# Patient Record
Sex: Male | Born: 1935 | Race: White | Hispanic: No | Marital: Married | Smoking: Former smoker
Health system: Southern US, Community
[De-identification: ages and names within clinical notes are randomized; demographics above are authoritative.]

## PROBLEM LIST (undated history)

## (undated) DIAGNOSIS — K7460 Unspecified cirrhosis of liver: Secondary | ICD-10-CM

## (undated) DIAGNOSIS — E079 Disorder of thyroid, unspecified: Secondary | ICD-10-CM

## (undated) DIAGNOSIS — E559 Vitamin D deficiency, unspecified: Secondary | ICD-10-CM

## (undated) DIAGNOSIS — I5032 Chronic diastolic (congestive) heart failure: Secondary | ICD-10-CM

## (undated) DIAGNOSIS — N390 Urinary tract infection, site not specified: Secondary | ICD-10-CM

## (undated) DIAGNOSIS — H409 Unspecified glaucoma: Secondary | ICD-10-CM

## (undated) DIAGNOSIS — M1990 Unspecified osteoarthritis, unspecified site: Secondary | ICD-10-CM

## (undated) DIAGNOSIS — M109 Gout, unspecified: Secondary | ICD-10-CM

## (undated) DIAGNOSIS — H3530 Unspecified macular degeneration: Secondary | ICD-10-CM

## (undated) HISTORY — PX: TYMPANOPLASTY: SHX33

## (undated) HISTORY — PX: BLADDER SURGERY: SHX569

## (undated) HISTORY — PX: CATARACT EXTRACTION, BILATERAL: SHX1313

## (undated) HISTORY — PX: RETINAL DETACHMENT SURGERY: SHX105

---
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
## 1973-03-26 HISTORY — PX: LYMPH NODE BIOPSY: SHX201

## 2003-04-22 ENCOUNTER — Other Ambulatory Visit: Admission: RE | Admit: 2003-04-22 | Discharge: 2003-04-22 | Payer: Self-pay | Admitting: Unknown Physician Specialty

## 2004-03-21 ENCOUNTER — Ambulatory Visit (HOSPITAL_COMMUNITY): Admission: RE | Admit: 2004-03-21 | Discharge: 2004-03-21 | Payer: Self-pay | Admitting: Ophthalmology

## 2004-05-22 ENCOUNTER — Ambulatory Visit (HOSPITAL_COMMUNITY): Admission: RE | Admit: 2004-05-22 | Discharge: 2004-05-23 | Payer: Self-pay | Admitting: Ophthalmology

## 2004-08-28 ENCOUNTER — Ambulatory Visit (HOSPITAL_COMMUNITY): Admission: RE | Admit: 2004-08-28 | Discharge: 2004-08-28 | Payer: Self-pay | Admitting: Ophthalmology

## 2004-11-16 ENCOUNTER — Other Ambulatory Visit: Admission: RE | Admit: 2004-11-16 | Discharge: 2004-11-16 | Payer: Self-pay | Admitting: Dermatology

## 2004-12-28 ENCOUNTER — Other Ambulatory Visit: Admission: RE | Admit: 2004-12-28 | Discharge: 2004-12-28 | Payer: Self-pay | Admitting: Dermatology

## 2005-03-29 ENCOUNTER — Other Ambulatory Visit: Admission: RE | Admit: 2005-03-29 | Discharge: 2005-03-29 | Payer: Self-pay | Admitting: Dermatology

## 2011-11-28 ENCOUNTER — Other Ambulatory Visit (HOSPITAL_COMMUNITY): Payer: Self-pay | Admitting: *Deleted

## 2011-11-28 ENCOUNTER — Ambulatory Visit (HOSPITAL_COMMUNITY)
Admission: RE | Admit: 2011-11-28 | Discharge: 2011-11-28 | Disposition: A | Discharge status: Home / Self Care | Payer: Medicare Other | Source: Ambulatory Visit | Attending: *Deleted | Admitting: *Deleted

## 2011-11-28 DIAGNOSIS — R1011 Right upper quadrant pain: Secondary | ICD-10-CM | POA: Insufficient documentation

## 2011-11-28 DIAGNOSIS — K8020 Calculus of gallbladder without cholecystitis without obstruction: Secondary | ICD-10-CM | POA: Insufficient documentation

## 2011-11-28 DIAGNOSIS — R932 Abnormal findings on diagnostic imaging of liver and biliary tract: Secondary | ICD-10-CM | POA: Insufficient documentation

## 2011-11-29 ENCOUNTER — Inpatient Hospital Stay (HOSPITAL_COMMUNITY)
Admission: EM | Admit: 2011-11-29 | Discharge: 2011-12-02 | DRG: 419 | Disposition: A | Discharge status: Home / Self Care | Payer: Medicare Other | Attending: General Surgery | Admitting: General Surgery

## 2011-11-29 ENCOUNTER — Inpatient Hospital Stay (HOSPITAL_COMMUNITY): Payer: Medicare Other

## 2011-11-29 ENCOUNTER — Encounter (HOSPITAL_COMMUNITY): Payer: Self-pay | Admitting: *Deleted

## 2011-11-29 ENCOUNTER — Emergency Department (HOSPITAL_COMMUNITY): Payer: Medicare Other

## 2011-11-29 DIAGNOSIS — H409 Unspecified glaucoma: Secondary | ICD-10-CM | POA: Diagnosis present

## 2011-11-29 DIAGNOSIS — K8000 Calculus of gallbladder with acute cholecystitis without obstruction: Principal | ICD-10-CM | POA: Diagnosis present

## 2011-11-29 DIAGNOSIS — Z7982 Long term (current) use of aspirin: Secondary | ICD-10-CM

## 2011-11-29 DIAGNOSIS — K810 Acute cholecystitis: Secondary | ICD-10-CM

## 2011-11-29 HISTORY — DX: Unspecified glaucoma: H40.9

## 2011-11-29 LAB — HEPATIC FUNCTION PANEL
ALT: 34 U/L (ref 0–53)
AST: 32 U/L (ref 0–37)
Albumin: 3.1 g/dL — ABNORMAL LOW (ref 3.5–5.2)
Alkaline Phosphatase: 198 U/L — ABNORMAL HIGH (ref 39–117)
Bilirubin, Direct: 0.6 mg/dL — ABNORMAL HIGH (ref 0.0–0.3)
Indirect Bilirubin: 1.1 mg/dL — ABNORMAL HIGH (ref 0.3–0.9)
Total Bilirubin: 1.7 mg/dL — ABNORMAL HIGH (ref 0.3–1.2)
Total Protein: 7.9 g/dL (ref 6.0–8.3)

## 2011-11-29 LAB — CBC WITH DIFFERENTIAL/PLATELET
Basophils Absolute: 0 10*3/uL (ref 0.0–0.1)
Eosinophils Absolute: 0.3 10*3/uL (ref 0.0–0.7)
Eosinophils Relative: 2 % (ref 0–5)
Hemoglobin: 14.9 g/dL (ref 13.0–17.0)
Lymphocytes Relative: 17 % (ref 12–46)
Lymphs Abs: 2.1 10*3/uL (ref 0.7–4.0)
MCH: 30.5 pg (ref 26.0–34.0)
MCHC: 33.9 g/dL (ref 30.0–36.0)
MCV: 90.2 fL (ref 78.0–100.0)
Monocytes Absolute: 1.2 10*3/uL — ABNORMAL HIGH (ref 0.1–1.0)
Monocytes Relative: 10 % (ref 3–12)
Neutrophils Relative %: 71 % (ref 43–77)
Platelets: 219 10*3/uL (ref 150–400)
RBC: 4.88 MIL/uL (ref 4.22–5.81)
RDW: 13.3 % (ref 11.5–15.5)
WBC: 12.5 10*3/uL — ABNORMAL HIGH (ref 4.0–10.5)

## 2011-11-29 LAB — URINALYSIS, ROUTINE W REFLEX MICROSCOPIC
Glucose, UA: NEGATIVE mg/dL
Hgb urine dipstick: NEGATIVE
Leukocytes, UA: NEGATIVE
Protein, ur: NEGATIVE mg/dL
Specific Gravity, Urine: 1.02 (ref 1.005–1.030)
Urobilinogen, UA: 0.2 mg/dL (ref 0.0–1.0)
pH: 5.5 (ref 5.0–8.0)

## 2011-11-29 LAB — BASIC METABOLIC PANEL
BUN: 21 mg/dL (ref 6–23)
CO2: 25 mEq/L (ref 19–32)
Calcium: 10.1 mg/dL (ref 8.4–10.5)
Creatinine, Ser: 1.11 mg/dL (ref 0.50–1.35)
Glucose, Bld: 122 mg/dL — ABNORMAL HIGH (ref 70–99)
Potassium: 4.1 mEq/L (ref 3.5–5.1)
Sodium: 134 mEq/L — ABNORMAL LOW (ref 135–145)

## 2011-11-29 LAB — SURGICAL PCR SCREEN: Staphylococcus aureus: NEGATIVE

## 2011-11-29 MED ORDER — SODIUM CHLORIDE 0.9 % IV SOLN
INTRAVENOUS | Status: DC
  Administered 2011-11-29: 19:00:00 via INTRAVENOUS

## 2011-11-29 MED ORDER — LATANOPROST 0.005 % OP SOLN
1.0000 [drp] | Freq: Every day | OPHTHALMIC | Status: DC
  Filled 2011-11-29: qty 2.5

## 2011-11-29 MED ORDER — CIPROFLOXACIN IN D5W 400 MG/200ML IV SOLN
400.0000 mg | Freq: Two times a day (BID) | INTRAVENOUS | Status: DC
  Administered 2011-11-29 – 2011-11-30 (×2): 400 mg via INTRAVENOUS
  Filled 2011-11-29 (×8): qty 200

## 2011-11-29 MED ORDER — CHLORHEXIDINE GLUCONATE 4 % EX LIQD
1.0000 "application " | Freq: Once | CUTANEOUS | Status: AC
  Administered 2011-11-29: 1 via TOPICAL
  Filled 2011-11-29: qty 15

## 2011-11-29 MED ORDER — MORPHINE SULFATE 2 MG/ML IJ SOLN
2.0000 mg | INTRAMUSCULAR | Status: DC | PRN
Start: 2011-11-29 — End: 2011-11-30

## 2011-11-29 MED ORDER — ENOXAPARIN SODIUM 40 MG/0.4ML ~~LOC~~ SOLN
40.0000 mg | SUBCUTANEOUS | Status: DC
  Administered 2011-11-29: 40 mg via SUBCUTANEOUS
  Filled 2011-11-29: qty 0.4

## 2011-11-29 MED ORDER — SODIUM CHLORIDE 0.9 % IV SOLN
INTRAVENOUS | Status: DC
  Administered 2011-11-29: 13:00:00 via INTRAVENOUS

## 2011-11-29 MED ORDER — DIPHENHYDRAMINE HCL 50 MG/ML IJ SOLN: 12.5000 mg | Freq: Four times a day (QID) | INTRAMUSCULAR | Status: DC | PRN

## 2011-11-29 MED ORDER — CIPROFLOXACIN IN D5W 400 MG/200ML IV SOLN
INTRAVENOUS | Status: AC
  Filled 2011-11-29: qty 200

## 2011-11-29 MED ORDER — ONDANSETRON HCL 4 MG/2ML IJ SOLN: 4.0000 mg | Freq: Four times a day (QID) | INTRAMUSCULAR | Status: DC | PRN

## 2011-11-29 MED ORDER — DIPHENHYDRAMINE HCL 12.5 MG/5ML PO ELIX: 12.5000 mg | ORAL_SOLUTION | Freq: Four times a day (QID) | ORAL | Status: DC | PRN

## 2011-11-29 MED ORDER — IOHEXOL 300 MG/ML  SOLN
100.0000 mL | Freq: Once | INTRAMUSCULAR | Status: AC | PRN
  Administered 2011-11-29: 100 mL via INTRAVENOUS

## 2011-11-29 MED ORDER — LACTATED RINGERS IV SOLN
INTRAVENOUS | Status: DC
  Administered 2011-11-29: 22:00:00 via INTRAVENOUS
  Administered 2011-11-30: 1000 mL via INTRAVENOUS

## 2011-11-29 NOTE — ED Notes (Signed)
Attempted to call report to Shanda Bumps, [place]. RN to call back.

## 2011-11-29 NOTE — ED Provider Notes (Signed)
History  This chart was scribed for Carleene Cooper III, MD by Bennett Scrape. This patient was seen in room [facility]18/[facility]18 and the patient's care was started at 12:49PM.  CSN: [id number]  Arrival date & time 11/29/11  1050   First MD Initiated Contact with Patient 11/29/11 1249      Chief Complaint  Patient presents with  . Abdominal Pain     The history is provided by the patient. No language interpreter was used.    Gabriel Spencer is a 78 y.o. male who presents to the Emergency Department complaining of 5 days of gradual onset, gradually improving, constant RLQ abdominal pain with associated diaphoresis, weakness, and decreased appetite.  The pain is non-radiating with no modifying factors. He denies taking OTC medications at home to improve symptoms and denies having prior episodes of similar symptoms. He states that he was seen by his PCP yesterday and had blood drawn and an [place] at the Medical center in [place]. He reports was told to come here by PCP today after reviewing his results but is unaware of the results himself. He denies fever, sore throat, visual disturbance, CP, cough, SOB, nausea, emesis, diarrhea, urinary symptoms, back pain, HA,  numbness and rash as associated symptoms.  He has a h/o glaucoma and takes ASA and use Xalatan daily. He denies smoking and alcohol use.   Past Medical History  Diagnosis Date  . Glaucoma     History reviewed. No pertinent past surgical history. Eye and ear surgery per pt at bedside  No family history on file.  History  Substance Use Topics  . Smoking status: Never Smoker   . Smokeless tobacco: Not on file  . Alcohol Use: No      Review of Systems  A complete 10 system review of systems was obtained and all systems are negative except as noted in the HPI and PMH.    Allergies  Keflex  Home Medications   Current Outpatient Rx  Name Route Sig Dispense Refill  . ASPIRIN EC 81 MG PO TBEC Oral Take 81 mg by mouth daily.    .  OCUVITE PO TABS Oral Take 1 tablet by mouth daily.    Marland Kitchen LATANOPROST 0.005 % OP SOLN Both Eyes Place 1 drop into both eyes at bedtime.      Triage Vitals: BP 149/66  Pulse 86  Temp 98.1 F (36.7 C) (Oral)  Resp 20  Ht 5\' 11"  (1.803 m)  Wt 230 lb (104.327 kg)  BMI 32.08 kg/m2  SpO2 96%  Physical Exam  Nursing note and vitals reviewed. Constitutional: He is oriented to person, place, and time. He appears well-developed and well-nourished. No distress.  HENT:  Head: Normocephalic and atraumatic.       Dry mucus membranes  Eyes: Conjunctivae and EOM are normal. Pupils are equal, round, and reactive to light.  Neck: Neck supple. No tracheal deviation present.  Cardiovascular: Normal rate, regular rhythm and normal heart sounds.   Pulmonary/Chest: Effort normal and breath sounds normal. No respiratory distress.  Abdominal: Soft. There is tenderness (RLQ ).  Musculoskeletal: Normal range of motion. He exhibits no edema.  Neurological: He is alert and oriented to person, place, and time.  Skin: Skin is warm and dry.  Psychiatric: He has a normal mood and affect. His behavior is normal.    ED Course  Procedures (including critical care time)  DIAGNOSTIC STUDIES: Oxygen Saturation is 96% on room air, adequate by my interpretation.    COORDINATION  OF CARE: 12:57PM-Discussed treatment plan which includes my review of prior lab and [place] reports with pt at bedside and pt agreed to plan.  1:05PM-Informed pt of [place] result showing a gallstone and a high white blood cell count. Discussed adding a CT scan to the treatment plan with pt and pt agreed to plan. He turned down pain medication.  Labs Reviewed  URINALYSIS, ROUTINE W REFLEX MICROSCOPIC - Abnormal; Notable for the following:    Bilirubin Urine SMALL (*)     Ketones, ur TRACE (*)     All other components within normal limits  CBC WITH DIFFERENTIAL - Abnormal; Notable for the following:    WBC 12.5 (*)     Neutro Abs 8.9 (*)      Monocytes Absolute 1.2 (*)     All other components within normal limits  BASIC METABOLIC PANEL - Abnormal; Notable for the following:    Sodium 134 (*)     Glucose, Bld 122 (*)     GFR calc non Af Amer 62 (*)     GFR calc Af Amer 71 (*)     All other components within normal limits   US Abdomen Limited Ruq  11/28/2011  *RADIOLOGY REPORT*  Clinical Data: Right quadrant pain.  LIMITED ABDOMINAL ULTRASOUND  Comparison:  None.  Findings:  Gallbladder:  1.7 cm mobile gallstone.  Gallbladder wall thickening measuring up to 5.9 mm.  No pericholecystic fluid.  The patient was not tender over this region during scanning per ultrasound technologist.  Common bile duct:  Proximal aspect measures 5 mm.  Mid to distal aspect not visualized secondary to bowel gas.  Liver:  Markedly heterogeneous appearance of the liver which appears elongated.  Slightly lobular contour.  Question underlying cirrhosis?  Within the dome of the liver is a 6 mm hypoechoic structure possibly a cyst although too small to characterize.  IMPRESSION: 1.7 cm mobile gallstone.  Gallbladder wall thickening measuring up to 5.9 mm.  No pericholecystic fluid.  The patient was not tender over this region during scanning per ultrasound technologist. Although cholecystitis is not excluded based on gallbladder wall thickening, this may be explained by other causes such as increased right heart pressure or metabolic abnormality.  Markedly heterogeneous appearance of the liver which appears elongated.  Slightly lobular contour.  Question underlying cirrhosis?  Within the dome of the liver is a 6 mm hypoechoic structure possibly a cyst although too small to characterize.   Original Report Authenticated By: Fuller Canada, M.D.     Date: 11/29/2011  Rate:87  Rhythm: normal sinus rhythm and premature atrial contractions (PAC)  QRS Axis: left  Intervals: normal QRS:  Left ventricular hypertrophy  ST/T Wave abnormalities: normal  Conduction  Disutrbances:none  Narrative Interpretation: Abnormal EKG  Old EKG Reviewed: unchanged  3:53 PM CT of abdomen confirms diagnosis of acute cholecystitis.  Case discussed with Franky Macho, M.D., who accepts pt for admission.  1. Acute cholecystitis     I personally performed the services described in this documentation, which was scribed in my presence. The recorded information has been reviewed and considered.  Osvaldo Human, MD       Carleene Cooper III, MD 11/29/11 [phone]

## 2011-11-29 NOTE — ED Notes (Signed)
Patient reports seeing PCP yesterday for same. Ultrasound obtained here at [facility]. US showed gallstone. Patient reports decreased appetite, generalized weakness.

## 2011-11-29 NOTE — ED Notes (Signed)
RLQ pain x 5 days ago.  Seen PCP yesterday and had blood drawn and [place].  Reports was told to come here by PCP.  Also c/o diaphoresis, weakness, decreased appetite.  Denies n/v/d.

## 2011-11-29 NOTE — ED Notes (Signed)
Patient transported to CT 

## 2011-11-29 NOTE — Progress Notes (Signed)
Dr. Lovell Sheehan paged and made aware of patient's arrival to room 308. No return page at this time.

## 2011-11-29 NOTE — H&P (Signed)
Gabriel Spencer is an 78 y.o. male.   Chief Complaint: Right upper quadrant abdominal pain HPI: Patient is a 78 year old white male who over this weekend record quadrant abdominal pain. Outpatient ultrasound was done which revealed cholelithiasis with a normal common bile duct. He returned the following day to the emergency room as he started having right upper quadrant abdominal pain. His primary care physician in the incidental sent him to the emergency room. He was noted have a mild leukocytosis. CT scan of the abdomen and pelvis revealed cholecystitis with cholelithiasis. The hepatobiliary tree was not dilated. Surgery was consulted and the patient was met in the hospital for further evaluation treatment. He states his abdominal pain has somewhat eased since his admission.  Past Medical History  Diagnosis Date  . Glaucoma     History reviewed. No pertinent past surgical history.  History reviewed. No pertinent family history. Social History:  reports that he has never smoked. He does not have any smokeless tobacco history on file. He reports that he does not drink alcohol or use illicit drugs.  Allergies:  Allergies  Allergen Reactions  . Keflex (Cephalexin) Hives    Medications Prior to Admission  Medication Sig Dispense Refill  . aspirin EC 81 MG tablet Take 81 mg by mouth daily.      . beta carotene w/minerals (OCUVITE) tablet Take 1 tablet by mouth daily.      Marland Kitchen latanoprost (XALATAN) 0.005 % ophthalmic solution Place 1 drop into both eyes at bedtime.        Results for orders placed during the hospital encounter of 11/29/11 (from the past 48 hour(s))  URINALYSIS, ROUTINE W REFLEX MICROSCOPIC     Status: Abnormal   Collection Time   11/29/11 11:27 AM      Component Value Range Comment   Color, Urine YELLOW  YELLOW    APPearance CLEAR  CLEAR    Specific Gravity, Urine 1.020  1.005 - 1.030    pH 5.5  5.0 - 8.0    Glucose, UA NEGATIVE  NEGATIVE mg/dL    Hgb urine dipstick NEGATIVE   NEGATIVE    Bilirubin Urine SMALL (*) NEGATIVE    Ketones, ur TRACE (*) NEGATIVE mg/dL    Protein, ur NEGATIVE  NEGATIVE mg/dL    Urobilinogen, UA 0.2  0.0 - 1.0 mg/dL    Nitrite NEGATIVE  NEGATIVE    Leukocytes, UA NEGATIVE  NEGATIVE MICROSCOPIC NOT DONE ON URINES WITH NEGATIVE PROTEIN, BLOOD, LEUKOCYTES, NITRITE, OR GLUCOSE <1000 mg/dL.  CBC WITH DIFFERENTIAL     Status: Abnormal   Collection Time   11/29/11 11:40 AM      Component Value Range Comment   WBC 12.5 (*) 4.0 - 10.5 K/uL    RBC 4.88  4.22 - 5.81 MIL/uL    Hemoglobin 14.9  13.0 - 17.0 g/dL    HCT 52.8  41.3 - 24.4 %    MCV 90.2  78.0 - 100.0 fL    MCH 30.5  26.0 - 34.0 pg    MCHC 33.9  30.0 - 36.0 g/dL    RDW 01.0  27.2 - 53.6 %    Platelets 219  150 - 400 K/uL    Neutrophils Relative 71  43 - 77 %    Neutro Abs 8.9 (*) 1.7 - 7.7 K/uL    Lymphocytes Relative 17  12 - 46 %    Lymphs Abs 2.1  0.7 - 4.0 K/uL    Monocytes Relative 10  3 -  12 %    Monocytes Absolute 1.2 (*) 0.1 - 1.0 K/uL    Eosinophils Relative 2  0 - 5 %    Eosinophils Absolute 0.3  0.0 - 0.7 K/uL    Basophils Relative 0  0 - 1 %    Basophils Absolute 0.0  0.0 - 0.1 K/uL   BASIC METABOLIC PANEL     Status: Abnormal   Collection Time   11/29/11 11:40 AM      Component Value Range Comment   Sodium 134 (*) 135 - 145 mEq/L    Potassium 4.1  3.5 - 5.1 mEq/L    Chloride 97  96 - 112 mEq/L    CO2 25  19 - 32 mEq/L    Glucose, Bld 122 (*) 70 - 99 mg/dL    BUN 21  6 - 23 mg/dL    Creatinine, Ser 1.61  0.50 - 1.35 mg/dL    Calcium 09.6  8.4 - 10.5 mg/dL    GFR calc non Af Amer 62 (*) >90 mL/min    GFR calc Af Amer 71 (*) >90 mL/min   HEPATIC FUNCTION PANEL     Status: Abnormal   Collection Time   11/29/11 11:40 AM      Component Value Range Comment   Total Protein 7.9  6.0 - 8.3 g/dL    Albumin 3.1 (*) 3.5 - 5.2 g/dL    AST 32  0 - 37 U/L    ALT 34  0 - 53 U/L    Alkaline Phosphatase 198 (*) 39 - 117 U/L    Total Bilirubin 1.7 (*) 0.3 - 1.2 mg/dL     Bilirubin, Direct 0.6 (*) 0.0 - 0.3 mg/dL    Indirect Bilirubin 1.1 (*) 0.3 - 0.9 mg/dL    Ct Abdomen Pelvis W Contrast  11/29/2011  *RADIOLOGY REPORT*  Clinical Data: Right lower quadrant pain.  Leukocytosis.  CT ABDOMEN AND PELVIS WITH CONTRAST  Technique:  Multidetector CT imaging of the abdomen and pelvis was performed following the standard protocol during bolus administration of intravenous contrast.  Contrast: OMNIPAQUE IOHEXOL 300 MG/ML  SOLN  Comparison: None.  Findings: Normal appendix is seen.  The gallbladder is dilated and shows diffuse gallbladder wall thickening and pericholecystic inflammatory changes.  At least one cholesterol gallstone is also seen.  These signs are consistent with acute cholecystitis.  There is no evidence of biliary ductal dilatation.  Mild hepatic steatosis noted, but no liver masses are identified. The pancreas is normal in appearance.  Spleen, adrenal glands, and kidneys are normal in appearance, except for a tiny nonobstructing 2 mm calculus in the upper pole of the left kidney.  No evidence of hydronephrosis.  Incidentally noted is a benign lipoma in the ascending colon measuring 2.5 cm.  No other soft tissue masses or lymphadenopathy identified within the abdomen or pelvis.  Diverticulosis is seen involving the descending and sigmoid portions of the colon, however there is no evidence of diverticulitis.  No abscess or free fluid identified.  IMPRESSION:  1.  No acute cholecystitis. 2.  No evidence of biliary ductal dilatation or appendicitis. 3.  Mild hepatic steatosis. 4.  Incidentally noted nonobstructing left nephrolithiasis, colonic diverticulosis and small right colonic lipoma.   Original Report Authenticated By: Danae Orleans, M.D.    US Abdomen Limited Ruq  11/28/2011  *RADIOLOGY REPORT*  Clinical Data: Right quadrant pain.  LIMITED ABDOMINAL ULTRASOUND  Comparison:  None.  Findings:  Gallbladder:  1.7 cm mobile gallstone.  Gallbladder wall thickening  measuring up to 5.9 mm.  No pericholecystic fluid.  The patient was not tender over this region during scanning per ultrasound technologist.  Common bile duct:  Proximal aspect measures 5 mm.  Mid to distal aspect not visualized secondary to bowel gas.  Liver:  Markedly heterogeneous appearance of the liver which appears elongated.  Slightly lobular contour.  Question underlying cirrhosis?  Within the dome of the liver is a 6 mm hypoechoic structure possibly a cyst although too small to characterize.  IMPRESSION: 1.7 cm mobile gallstone.  Gallbladder wall thickening measuring up to 5.9 mm.  No pericholecystic fluid.  The patient was not tender over this region during scanning per ultrasound technologist. Although cholecystitis is not excluded based on gallbladder wall thickening, this may be explained by other causes such as increased right heart pressure or metabolic abnormality.  Markedly heterogeneous appearance of the liver which appears elongated.  Slightly lobular contour.  Question underlying cirrhosis?  Within the dome of the liver is a 6 mm hypoechoic structure possibly a cyst although too small to characterize.   Original Report Authenticated By: Fuller Canada, M.D.     Review of Systems  Constitutional: Negative.   HENT: Negative.   Respiratory: Negative.   Cardiovascular: Negative.   Gastrointestinal: Positive for nausea and abdominal pain.  Genitourinary: Negative.   Musculoskeletal: Negative.   Skin: Negative.   Neurological: Negative.   Endo/Heme/Allergies: Negative.     Blood pressure 126/73, pulse 85, temperature 97.1 F (36.2 C), temperature source Oral, resp. rate 18, height 5\' 11"  (1.803 m), weight 100.472 kg (221 lb 8 oz), SpO2 94.00%. Physical Exam  Constitutional: He is oriented to person, place, and time. He appears well-developed and well-nourished.  HENT:  Head: Normocephalic and atraumatic.  Eyes: No scleral icterus.  Neck: Normal range of motion. Neck supple.    Cardiovascular: Normal rate, regular rhythm and normal heart sounds.   Respiratory: Effort normal and breath sounds normal.  GI: Soft. Bowel sounds are normal. He exhibits no distension. There is no tenderness. There is no rebound.  Neurological: He is alert and oriented to person, place, and time.  Skin: Skin is warm and dry.     Assessment/Plan Impression: Cholecystitis, cholelithiasis. Plan: Patient admitted to the hospital for IV antibiotics and control of his abdominal pain. He stopped clear will undergo a laparoscopic cholecystectomy with possible cholangiograms tomorrow. Risks and benefits of the procedure including bleeding, infection, hepatobiliary, the possibly of an open procedure were fully explained to the patient, gave informed consent.  Dannon Perlow A 11/29/2011, 8:26 PM

## 2011-11-29 NOTE — ED Notes (Signed)
Report given to Cicily,RN unit 300 Ready to receive patient.

## 2011-11-30 ENCOUNTER — Inpatient Hospital Stay (HOSPITAL_COMMUNITY): Payer: Medicare Other

## 2011-11-30 ENCOUNTER — Inpatient Hospital Stay (HOSPITAL_COMMUNITY): Payer: Medicare Other | Admitting: Anesthesiology

## 2011-11-30 ENCOUNTER — Encounter (HOSPITAL_COMMUNITY): Payer: Self-pay | Admitting: Anesthesiology

## 2011-11-30 ENCOUNTER — Encounter (HOSPITAL_COMMUNITY): Payer: Self-pay | Admitting: *Deleted

## 2011-11-30 ENCOUNTER — Encounter (HOSPITAL_COMMUNITY)
Admission: EM | Disposition: A | Discharge status: Home / Self Care | Payer: Self-pay | Source: Home / Self Care | Attending: General Surgery

## 2011-11-30 HISTORY — PX: CHOLECYSTECTOMY: SHX55

## 2011-11-30 LAB — HEPATIC FUNCTION PANEL
ALT: 27 U/L (ref 0–53)
ALT: 30 U/L (ref 0–53)
AST: 28 U/L (ref 0–37)
Albumin: 2.6 g/dL — ABNORMAL LOW (ref 3.5–5.2)
Alkaline Phosphatase: 187 U/L — ABNORMAL HIGH (ref 39–117)
Alkaline Phosphatase: 189 U/L — ABNORMAL HIGH (ref 39–117)
Bilirubin, Direct: 0.4 mg/dL — ABNORMAL HIGH (ref 0.0–0.3)
Indirect Bilirubin: 0.8 mg/dL (ref 0.3–0.9)
Indirect Bilirubin: 0.7 mg/dL (ref 0.3–0.9)
Total Bilirubin: 1.2 mg/dL (ref 0.3–1.2)
Total Protein: 6.8 g/dL (ref 6.0–8.3)
Total Protein: 6.7 g/dL (ref 6.0–8.3)

## 2011-11-30 LAB — CBC
HCT: 39.8 % (ref 39.0–52.0)
Hemoglobin: 13.2 g/dL (ref 13.0–17.0)
MCH: 29.9 pg (ref 26.0–34.0)
MCHC: 33.2 g/dL (ref 30.0–36.0)
MCV: 90.2 fL (ref 78.0–100.0)
Platelets: 225 10*3/uL (ref 150–400)
RBC: 4.41 MIL/uL (ref 4.22–5.81)
RDW: 13.4 % (ref 11.5–15.5)
WBC: 10.9 10*3/uL — ABNORMAL HIGH (ref 4.0–10.5)

## 2011-11-30 SURGERY — LAPAROSCOPIC CHOLECYSTECTOMY
Anesthesia: General | Site: Abdomen | Wound class: Contaminated

## 2011-11-30 MED ORDER — MIDAZOLAM HCL 2 MG/2ML IJ SOLN
INTRAMUSCULAR | Status: AC
  Filled 2011-11-30: qty 2

## 2011-11-30 MED ORDER — ONDANSETRON HCL 4 MG/2ML IJ SOLN
4.0000 mg | Freq: Once | INTRAMUSCULAR | Status: AC
  Administered 2011-11-30: 4 mg via INTRAVENOUS

## 2011-11-30 MED ORDER — ENOXAPARIN SODIUM 40 MG/0.4ML ~~LOC~~ SOLN
40.0000 mg | SUBCUTANEOUS | Status: DC
  Administered 2011-12-01 – 2011-12-02 (×2): 40 mg via SUBCUTANEOUS
  Filled 2011-11-30 (×2): qty 0.4

## 2011-11-30 MED ORDER — ACETAMINOPHEN 10 MG/ML IV SOLN
1000.0000 mg | Freq: Four times a day (QID) | INTRAVENOUS | Status: AC
  Administered 2011-11-30 – 2011-12-01 (×4): 1000 mg via INTRAVENOUS
  Filled 2011-11-30 (×3): qty 100

## 2011-11-30 MED ORDER — LACTATED RINGERS IV SOLN: INTRAVENOUS | Status: DC

## 2011-11-30 MED ORDER — PROPOFOL 10 MG/ML IV EMUL
INTRAVENOUS | Status: DC | PRN
  Administered 2011-11-30: 120 mg via INTRAVENOUS

## 2011-11-30 MED ORDER — ONDANSETRON HCL 4 MG/2ML IJ SOLN: 4.0000 mg | Freq: Once | INTRAMUSCULAR | Status: DC | PRN

## 2011-11-30 MED ORDER — FENTANYL CITRATE 0.05 MG/ML IJ SOLN
INTRAMUSCULAR | Status: AC
  Filled 2011-11-30: qty 5

## 2011-11-30 MED ORDER — SUCCINYLCHOLINE CHLORIDE 20 MG/ML IJ SOLN
INTRAMUSCULAR | Status: AC
  Filled 2011-11-30: qty 1

## 2011-11-30 MED ORDER — ONDANSETRON HCL 4 MG PO TABS: 4.0000 mg | ORAL_TABLET | Freq: Four times a day (QID) | ORAL | Status: DC | PRN

## 2011-11-30 MED ORDER — SUCCINYLCHOLINE CHLORIDE 20 MG/ML IJ SOLN
INTRAMUSCULAR | Status: DC | PRN
  Administered 2011-11-30: 140 mg via INTRAVENOUS

## 2011-11-30 MED ORDER — FENTANYL CITRATE 0.05 MG/ML IJ SOLN
25.0000 ug | INTRAMUSCULAR | Status: DC | PRN
  Administered 2011-11-30 (×2): 25 ug via INTRAVENOUS

## 2011-11-30 MED ORDER — ROCURONIUM BROMIDE 50 MG/5ML IV SOLN
INTRAVENOUS | Status: AC
  Filled 2011-11-30: qty 1

## 2011-11-30 MED ORDER — ONDANSETRON HCL 4 MG/2ML IJ SOLN
INTRAMUSCULAR | Status: AC
  Filled 2011-11-30: qty 2

## 2011-11-30 MED ORDER — ONDANSETRON HCL 4 MG/2ML IJ SOLN: 4.0000 mg | Freq: Four times a day (QID) | INTRAMUSCULAR | Status: DC | PRN

## 2011-11-30 MED ORDER — GLYCOPYRROLATE 0.2 MG/ML IJ SOLN
0.2000 mg | Freq: Once | INTRAMUSCULAR | Status: AC
  Administered 2011-11-30: 0.2 mg via INTRAVENOUS

## 2011-11-30 MED ORDER — HEMOSTATIC AGENTS (NO CHARGE) OPTIME
TOPICAL | Status: DC | PRN
  Administered 2011-11-30 (×2): 1 via TOPICAL

## 2011-11-30 MED ORDER — BUPIVACAINE HCL (PF) 0.5 % IJ SOLN
INTRAMUSCULAR | Status: DC | PRN
  Administered 2011-11-30: 10 mL

## 2011-11-30 MED ORDER — GLYCOPYRROLATE 0.2 MG/ML IJ SOLN
INTRAMUSCULAR | Status: AC
  Filled 2011-11-30: qty 1

## 2011-11-30 MED ORDER — BUPIVACAINE HCL (PF) 0.5 % IJ SOLN
INTRAMUSCULAR | Status: AC
  Filled 2011-11-30: qty 30

## 2011-11-30 MED ORDER — 0.9 % SODIUM CHLORIDE (POUR BTL) OPTIME
TOPICAL | Status: DC | PRN
  Administered 2011-11-30: 1000 mL

## 2011-11-30 MED ORDER — FENTANYL CITRATE 0.05 MG/ML IJ SOLN
INTRAMUSCULAR | Status: DC | PRN
  Administered 2011-11-30: 25 ug via INTRAVENOUS
  Administered 2011-11-30: 100 ug via INTRAVENOUS
  Administered 2011-11-30: 50 ug via INTRAVENOUS

## 2011-11-30 MED ORDER — ACETAMINOPHEN 10 MG/ML IV SOLN
INTRAVENOUS | Status: AC
  Filled 2011-11-30: qty 100

## 2011-11-30 MED ORDER — CIPROFLOXACIN IN D5W 400 MG/200ML IV SOLN
400.0000 mg | Freq: Two times a day (BID) | INTRAVENOUS | Status: DC
  Administered 2011-12-01 – 2011-12-02 (×3): 400 mg via INTRAVENOUS
  Filled 2011-11-30 (×7): qty 200

## 2011-11-30 MED ORDER — MIDAZOLAM HCL 2 MG/2ML IJ SOLN
1.0000 mg | INTRAMUSCULAR | Status: DC | PRN
  Administered 2011-11-30: 2 mg via INTRAVENOUS

## 2011-11-30 MED ORDER — LACTATED RINGERS IV SOLN
INTRAVENOUS | Status: DC
  Administered 2011-11-30 – 2011-12-01 (×3): via INTRAVENOUS

## 2011-11-30 MED ORDER — FENTANYL CITRATE 0.05 MG/ML IJ SOLN
INTRAMUSCULAR | Status: AC
  Filled 2011-11-30: qty 2

## 2011-11-30 MED ORDER — PROPOFOL 10 MG/ML IV EMUL
INTRAVENOUS | Status: AC
  Filled 2011-11-30: qty 20

## 2011-11-30 MED ORDER — MORPHINE SULFATE 2 MG/ML IJ SOLN
2.0000 mg | INTRAMUSCULAR | Status: DC | PRN
  Administered 2011-11-30 (×4): 2 mg via INTRAVENOUS
  Filled 2011-11-30 (×4): qty 1

## 2011-11-30 MED ORDER — LACTATED RINGERS IV SOLN
INTRAVENOUS | Status: DC | PRN
  Administered 2011-11-30: 10:00:00 via INTRAVENOUS

## 2011-11-30 MED ORDER — EPHEDRINE SULFATE 50 MG/ML IJ SOLN
INTRAMUSCULAR | Status: DC | PRN
  Administered 2011-11-30: 5 mg via INTRAVENOUS

## 2011-11-30 MED ORDER — CIPROFLOXACIN IN D5W 400 MG/200ML IV SOLN
INTRAVENOUS | Status: AC
  Filled 2011-11-30: qty 200

## 2011-11-30 MED ORDER — ALUM & MAG HYDROXIDE-SIMETH 200-200-20 MG/5ML PO SUSP: 30.0000 mL | ORAL | Status: DC | PRN

## 2011-11-30 MED ORDER — SODIUM CHLORIDE 0.9 % IR SOLN
Status: DC | PRN
  Administered 2011-11-30: 3000 mL

## 2011-11-30 SURGICAL SUPPLY — 48 items
APPLIER CLIP ROT 10 11.4 M/L (STAPLE) ×3
APR CLP MED LRG 11.4X10 (STAPLE) ×2
BAG HAMPER (MISCELLANEOUS) ×3 IMPLANT
BAG SPEC RTRVL LRG 6X4 10 (ENDOMECHANICALS) ×2
CATH CHOLANGIOGRAM 4.5FR (CATHETERS) ×1 IMPLANT
CLIP APPLIE ROT 10 11.4 M/L (STAPLE) ×2 IMPLANT
CLOTH BEACON ORANGE TIMEOUT ST (SAFETY) ×3 IMPLANT
COVER LIGHT HANDLE STERIS (MISCELLANEOUS) ×6 IMPLANT
COVER MAYO STAND XLG (DRAPE) ×1 IMPLANT
DECANTER SPIKE VIAL GLASS SM (MISCELLANEOUS) ×3 IMPLANT
DISSECTOR BLUNT TIP ENDO 5MM (MISCELLANEOUS) IMPLANT
DRAPE C-ARM FOLDED MOBILE STRL (DRAPES) ×1 IMPLANT
DURAPREP 26ML APPLICATOR (WOUND CARE) ×3 IMPLANT
ELECT REM PT RETURN 9FT ADLT (ELECTROSURGICAL) ×3
ELECTRODE REM PT RTRN 9FT ADLT (ELECTROSURGICAL) ×2 IMPLANT
EVACUATOR DRAINAGE 10X20 100CC (DRAIN) ×1 IMPLANT
EVACUATOR SILICONE 100CC (DRAIN) ×3
FILTER SMOKE EVAC LAPAROSHD (FILTER) ×3 IMPLANT
FORMALIN 10 PREFIL 120ML (MISCELLANEOUS) ×3 IMPLANT
GLOVE BIO SURGEON STRL SZ7.5 (GLOVE) ×3 IMPLANT
GLOVE BIOGEL PI IND STRL 7.0 (GLOVE) ×2 IMPLANT
GLOVE BIOGEL PI INDICATOR 7.0 (GLOVE) ×2
GLOVE ECLIPSE 6.5 STRL STRAW (GLOVE) ×2 IMPLANT
GLOVE SS BIOGEL STRL SZ 6.5 (GLOVE) ×1 IMPLANT
GLOVE SUPERSENSE BIOGEL SZ 6.5 (GLOVE) ×1
GOWN STRL REIN XL XLG (GOWN DISPOSABLE) ×11 IMPLANT
HEMOSTAT SNOW SURGICEL 2X4 (HEMOSTASIS) ×5 IMPLANT
INST SET LAPROSCOPIC AP (KITS) ×3 IMPLANT
IV NS IRRIG 3000ML ARTHROMATIC (IV SOLUTION) ×2 IMPLANT
KIT ROOM TURNOVER APOR (KITS) ×3 IMPLANT
KIT TROCAR LAP CHOLE (TROCAR) ×3 IMPLANT
MANIFOLD NEPTUNE II (INSTRUMENTS) ×3 IMPLANT
NS IRRIG 1000ML POUR BTL (IV SOLUTION) ×3 IMPLANT
PACK LAP CHOLE LZT030E (CUSTOM PROCEDURE TRAY) ×3 IMPLANT
PAD ARMBOARD 7.5X6 YLW CONV (MISCELLANEOUS) ×3 IMPLANT
POUCH SPECIMEN RETRIEVAL 10MM (ENDOMECHANICALS) ×3 IMPLANT
SET BASIN LINEN APH (SET/KITS/TRAYS/PACK) ×3 IMPLANT
SET TUBE IRRIG SUCTION NO TIP (IRRIGATION / IRRIGATOR) ×2 IMPLANT
SPONGE DRAIN TRACH 4X4 STRL 2S (GAUZE/BANDAGES/DRESSINGS) ×2 IMPLANT
SPONGE GAUZE 2X2 8PLY STRL LF (GAUZE/BANDAGES/DRESSINGS) ×6 IMPLANT
STAPLER VISISTAT (STAPLE) ×3 IMPLANT
SUT ETHILON 3 0 FSL (SUTURE) ×2 IMPLANT
SUT VICRYL 0 UR6 27IN ABS (SUTURE) ×5 IMPLANT
SYR 20CC LL (SYRINGE) ×1 IMPLANT
SYR 30ML LL (SYRINGE) ×1 IMPLANT
TAPE CLOTH SURG 4X10 WHT LF (GAUZE/BANDAGES/DRESSINGS) ×2 IMPLANT
WARMER LAPAROSCOPE (MISCELLANEOUS) ×3 IMPLANT
YANKAUER SUCT 12FT TUBE ARGYLE (SUCTIONS) ×3 IMPLANT

## 2011-11-30 NOTE — Transfer of Care (Signed)
Immediate Anesthesia Transfer of Care Note  Patient: Gabriel Spencer  Procedure(s) Performed: Procedure(s) (LRB) with comments: LAPAROSCOPIC CHOLECYSTECTOMY (N/A)  Patient Location: PACU  Anesthesia Type: General  Level of Consciousness: awake, alert  and oriented  Airway & Oxygen Therapy: Patient Spontanous Breathing and Patient connected to face mask oxygen  Post-op Assessment: Report given to PACU RN  Post vital signs: Reviewed and stable  Complications: No apparent anesthesia complications

## 2011-11-30 NOTE — Anesthesia Preprocedure Evaluation (Signed)
Anesthesia Evaluation  Patient identified by MRN, date of birth, ID band Patient awake    Reviewed: Allergy & Precautions, H&P , NPO status , Patient's Chart, lab work & pertinent test results  Airway Mallampati: II TM Distance: >3 FB Neck ROM: Limited    Dental  (+) Teeth Intact   Pulmonary neg pulmonary ROS,    Pulmonary exam normal       Cardiovascular negative cardio ROS  Rhythm:Regular Rate:Normal     Neuro/Psych negative neurological ROS  negative psych ROS   GI/Hepatic negative GI ROS, Neg liver ROS,   Endo/Other  negative endocrine ROS  Renal/GU negative Renal ROS     Musculoskeletal negative musculoskeletal ROS (+)   Abdominal Normal abdominal exam  (+)   Peds  Hematology negative hematology ROS (+)   Anesthesia Other Findings   Reproductive/Obstetrics                           Anesthesia Physical Anesthesia Plan  ASA: II  Anesthesia Plan: General   Post-op Pain Management:    Induction: Intravenous  Airway Management Planned: Video Laryngoscope Planned and Oral ETT  Additional Equipment:   Intra-op Plan:   Post-operative Plan: Extubation in OR  Informed Consent: I have reviewed the patients History and Physical, chart, labs and discussed the procedure including the risks, benefits and alternatives for the proposed anesthesia with the patient or authorized representative who has indicated his/her understanding and acceptance.   Dental advisory given  Plan Discussed with: CRNA  Anesthesia Plan Comments:         Anesthesia Quick Evaluation

## 2011-11-30 NOTE — Preoperative (Signed)
Beta Blockers   Reason not to administer Beta Blockers:Not Applicable 

## 2011-11-30 NOTE — Progress Notes (Signed)
UR Chart Review Completed  

## 2011-11-30 NOTE — Op Note (Signed)
Patient:  Gabriel Spencer  DOB:  November 13, 1931  MRN:  [id number]   Preop Diagnosis:  Acute cholecystitis, cholelithiasis  Postop Diagnosis:  Same, hydrops of gallbladder with gangrenous changes  Procedure:  Laparoscopic cholecystectomy  Surgeon:  Franky Macho, M.D.  Anes:  General endotracheal  Indications:  Patient is a 78 year old white male who presents with acute cholecystitis secondary to cholelithiasis. The risks and benefits of the procedure including bleeding, infection, hepatobiliary, the possibly of an open procedure were fully explained to the patient, gave informed consent.  Procedure note:  Patient is placed the supine position. After induction of general endotracheal anesthesia, the abdomen was prepped and draped using the usual sterile technique with DuraPrep. Surgical site confirmation was performed.  A supraumbilical incision was made down to the fascia. A Veress needle was introduced into the abdominal cavity and confirmation of placement was done using the saline drop test. The abdomen was then insufflated to 16 mm mercury pressure. An 11 mm trocar was introduced into the abdominal cavity under direct visualization without difficulty. The patient was placed in reverse Trendelenburg position and additional 11 mm trocar was placed the epigastric region and 5 mm trochars were placed the right upper quadrant and right flank regions. The liver was inspected and noted to be mildly cirrhotic in nature. The gallbladder was noted to have gangrenous changes with significant edema and inflammation of the gallbladder wall. The gallbladder was decompressed and hydrops of the gallbladder was found. A single stone was noted to be in the neck of the cystic duct. This was dislodged in order to facilitate grasping of the gallbladder. The gallbladder was retracted superiorly and laterally. The dissection was begun around the infundibulum of the gallbladder. The cystic duct was first identified. Its  junction to the infundibulum flow identified. Endoclips placed proximally distally on the cystic duct and cystic duct was divided. This was likewise done to the cystic artery. The gallbladder was then freed away from the gallbladder fossa using Bovie electrocautery. The gallbladder delivered through the epigastric trocar site using an Endo Catch bag. The gallbladder bed was inspected and any bleeding was controlled using Bovie electrocautery. The right upper quadrant was copiously are irrigated normal saline. A #10 flat Jackson-Pratt drain was placed into the gallbladder bed and brought out through the right lateral trocar site. It was secured at the skin level using 3-0 nylon interrupted suture. Surgicel is placed the gallbladder fossa. All fluid and air were then evacuated from the abdominal cavity prior to removal of the trochars.  All wounds were gave normal saline. All wounds were injected with 0.5% Sensorcaine. The epigastric fascia as well as supraumbilical fascia were reapproximated using 0 Vicryl interrupted sutures. All skin incisions were closed using staples. Betadine ointment and dry sterile dressings were applied.  All tape and needle counts were correct at the end of the procedure. The patient was extubated in the operating room and went back to recovery room awake in stable condition.  Complications:  None  EBL:  Minimal  Specimen:  Gallbladder  Drains: JP drain to gallbladder bed

## 2011-11-30 NOTE — Anesthesia Postprocedure Evaluation (Signed)
  Anesthesia Post-op Note  Patient: Gabriel Spencer  Procedure(s) Performed: Procedure(s) (LRB) with comments: LAPAROSCOPIC CHOLECYSTECTOMY (N/A)  Patient Location: PACU  Anesthesia Type: General  Level of Consciousness: awake, alert  and oriented  Airway and Oxygen Therapy: Patient Spontanous Breathing and Patient connected to face mask oxygen  Post-op Pain: none  Post-op Assessment: Post-op Vital signs reviewed, Patient's Cardiovascular Status Stable, Respiratory Function Stable, Patent Airway, No signs of Nausea or vomiting and Pain level controlled  Post-op Vital Signs: Reviewed and stable  Complications: No apparent anesthesia complications

## 2011-12-01 LAB — BASIC METABOLIC PANEL
BUN: 14 mg/dL (ref 6–23)
Calcium: 9 mg/dL (ref 8.4–10.5)
Creatinine, Ser: 1.04 mg/dL (ref 0.50–1.35)
GFR calc Af Amer: 77 mL/min — ABNORMAL LOW (ref >90)
GFR calc non Af Amer: 67 mL/min — ABNORMAL LOW (ref >90)
Potassium: 4.3 mEq/L (ref 3.5–5.1)

## 2011-12-01 LAB — CBC
HCT: 37.5 % — ABNORMAL LOW (ref 39.0–52.0)
MCH: 30 pg (ref 26.0–34.0)
MCHC: 33.1 g/dL (ref 30.0–36.0)
MCV: 90.6 fL (ref 78.0–100.0)
RDW: 13.5 % (ref 11.5–15.5)

## 2011-12-01 MED ORDER — HYDROCODONE-ACETAMINOPHEN 5-325 MG PO TABS
1.0000 | ORAL_TABLET | ORAL | Status: DC | PRN
  Administered 2011-12-01 – 2011-12-02 (×4): 1 via ORAL
  Filled 2011-12-01 (×4): qty 1

## 2011-12-01 NOTE — Progress Notes (Signed)
Patient noted to 84% on RA, patient denies any shortness of breath at this time, lung sounds clear,applied 2L of O2 via nasal canula, O2 improved to 91%,  Incentive spirometry done at this time, O2 sat increased to 95% afterwards, encourage incentive spirometry used every 2 hours while awake, patient verbalized understanding,called and spoke with Dr. Lovell Sheehan, new order for O2 via [place]

## 2011-12-01 NOTE — Progress Notes (Signed)
1 Day Post-Op  Subjective: Feels much better. Tolerating clear liquid diet well.  Objective: Vital signs in last 24 hours: Temp:  [96.8 F (36 C)-98.2 F (36.8 C)] 96.8 F (36 C) (09/07 0200) Pulse Rate:  [76-94] 76  (09/07 0200) Resp:  [12-20] 20  (09/07 0200) BP: (103-148)/(54-79) 103/65 mmHg (09/07 0200) SpO2:  [90 %-100 %] 97 % (09/07 0200) Last BM Date: 11/30/11  Intake/Output from previous day: 09/06 0701 - 09/07 0700 In: 3121.7 [P.O.:650; I.V.:2471.7] Out: 1230 [Urine:950; Drains:180; Blood:100] Intake/Output this shift: Total I/O In: 825 [P.O.:825] Out: -   General appearance: alert, cooperative and appears stated age Resp: clear to auscultation bilaterally Cardio: regular rate and rhythm, S1, S2 normal, no murmur, click, rub or gallop GI: Soft. Dressings dry and intact. JP drainage with serosanguineous drainage. No bilious drainage noted.  Lab Results:   [facility] 12/01/11 0605 11/30/11 0447  WBC 12.4* 10.9*  HGB 12.4* 13.2  HCT 37.5* 39.8  PLT 227 225   BMET  Basename 12/01/11 0605 11/29/11 1140  NA 133* 134*  K 4.3 4.1  CL 97 97  CO2 27 25  GLUCOSE 132* 122*  BUN 14 21  CREATININE 1.04 1.11  CALCIUM 9.0 10.1   PT/INR No results found for this basename: LABPROT:2,INR:2 in the last 72 hours  Studies/Results: Chest 2 View  11/29/2011  *RADIOLOGY REPORT*  Clinical Data: Preoperative assessment for cholecystectomy  CHEST - 2 VIEW  Comparison: 03/21/2004  Findings: Normal heart size, mediastinal contours and pulmonary vascularity. Bronchitic changes with bibasilar atelectasis. Upper lungs clear. No pleural effusion or pneumothorax. Question thoracic ankylosis.  IMPRESSION: Bronchitic changes with bibasilar atelectasis.   Original Report Authenticated By: Lollie Marrow, M.D.    Ct Abdomen Pelvis W Contrast  11/29/2011  *RADIOLOGY REPORT*  Clinical Data: Right lower quadrant pain.  Leukocytosis.  CT ABDOMEN AND PELVIS WITH CONTRAST  Technique:  Multidetector  CT imaging of the abdomen and pelvis was performed following the standard protocol during bolus administration of intravenous contrast.  Contrast: OMNIPAQUE IOHEXOL 300 MG/ML  SOLN  Comparison: None.  Findings: Normal appendix is seen.  The gallbladder is dilated and shows diffuse gallbladder wall thickening and pericholecystic inflammatory changes.  At least one cholesterol gallstone is also seen.  These signs are consistent with acute cholecystitis.  There is no evidence of biliary ductal dilatation.  Mild hepatic steatosis noted, but no liver masses are identified. The pancreas is normal in appearance.  Spleen, adrenal glands, and kidneys are normal in appearance, except for a tiny nonobstructing 2 mm calculus in the upper pole of the left kidney.  No evidence of hydronephrosis.  Incidentally noted is a benign lipoma in the ascending colon measuring 2.5 cm.  No other soft tissue masses or lymphadenopathy identified within the abdomen or pelvis.  Diverticulosis is seen involving the descending and sigmoid portions of the colon, however there is no evidence of diverticulitis.  No abscess or free fluid identified.  IMPRESSION:  1.  No acute cholecystitis. 2.  No evidence of biliary ductal dilatation or appendicitis. 3.  Mild hepatic steatosis. 4.  Incidentally noted nonobstructing left nephrolithiasis, colonic diverticulosis and small right colonic lipoma.   Original Report Authenticated By: Danae Orleans, M.D.     Anti-infectives: Anti-infectives     Start     Dose/Rate Route Frequency Ordered Stop   11/30/11 1400   ciprofloxacin (CIPRO) IVPB 400 mg        400 mg 200 mL/hr over 60 Minutes  Intravenous Every 12 hours 11/30/11 1230     11/29/11 2200   ciprofloxacin (CIPRO) IVPB 400 mg  Status:  Discontinued        400 mg 200 mL/hr over 60 Minutes Intravenous Every 12 hours 11/29/11 2031 11/30/11 1218          Assessment/Plan: s/p Procedure(s): LAPAROSCOPIC CHOLECYSTECTOMY Impression:  Stable, status post laparoscopic cholecystectomy for acute gangrenous cholecystitis. Will advance diet. Continue IV Unasyn. Anticipate discharge in next 24-48 hours. Continue to monitor mildly elevated leukocytosis, liver enzyme tests.  LOS: 2 days    Gabriel Spencer A 12/01/2011

## 2011-12-02 LAB — HEPATIC FUNCTION PANEL
AST: 45 U/L — ABNORMAL HIGH (ref 0–37)
Albumin: 2.3 g/dL — ABNORMAL LOW (ref 3.5–5.2)

## 2011-12-02 LAB — BASIC METABOLIC PANEL
Calcium: 8.9 mg/dL (ref 8.4–10.5)
GFR calc Af Amer: 78 mL/min — ABNORMAL LOW (ref >90)
GFR calc non Af Amer: 67 mL/min — ABNORMAL LOW (ref >90)
Potassium: 4 mEq/L (ref 3.5–5.1)
Sodium: 133 mEq/L — ABNORMAL LOW (ref 135–145)

## 2011-12-02 LAB — CBC
MCHC: 33.5 g/dL (ref 30.0–36.0)
Platelets: 260 10*3/uL (ref 150–400)
RDW: 13.4 % (ref 11.5–15.5)

## 2011-12-02 MED ORDER — HYDROCODONE-ACETAMINOPHEN 5-325 MG PO TABS: 1.0000 | ORAL_TABLET | ORAL | Status: AC | PRN

## 2011-12-02 MED ORDER — CIPROFLOXACIN HCL 500 MG PO TABS: 500.0000 mg | ORAL_TABLET | Freq: Two times a day (BID) | ORAL | Status: AC

## 2011-12-02 NOTE — Progress Notes (Signed)
Rechecked patients oxygen saturation on RA 89-90% with no complaints of SOB or difficulty breathing. Patient expressed wishes to go home. Dr. Lovell Sheehan notified and I was instructed to send patient home. Gave patient discharge instructions with no questions unanswered. Showed patient and wife how to empty, charge, and record JP drainage. Strongly encouraged deep breathing, coughing, and ambulation as well as IS. Left with wife in stable condition. Taken out of facility by staff via wheelchair.

## 2011-12-02 NOTE — Progress Notes (Signed)
Checked patients oxygen saturation on RA found it to be 88-89%. No complaints of SOB or difficulty breathing. Patient using IS at 750. Encouraged deep breathing and coughing with splinting. Also ambulated patient. Oxygen saturations highest at 92% when deep breathing but drops back to 88%. Dr. Lovell Sheehan notified, stated to get patient OOB, leave on RA and recheck saturations after lunch. Will notify MD of results.

## 2011-12-02 NOTE — Discharge Summary (Signed)
Physician Discharge Summary  Patient ID: Gabriel Spencer MRN: [id number] DOB/AGE: Sep 30, 1931 78 y.o.  Admit date: 11/29/2011 Discharge date: 12/02/2011  Admission Diagnoses: Acute cholecystitis, cholelithiasis  Discharge Diagnoses: Same Active Problems:  * No active hospital problems. *    Discharged Condition: good  Hospital Course: Patient is a 78 year old white male who was admitted on 11/29/2011 with right upper quadrant abdominal pain and findings consistent with acute cholecystitis with cholelithiasis. He went to the operating room on 11/30/2011 underwent a laparoscopic appendectomy. He was found to have hydrops of the gallbladder as well as a possible gangrenous changes in the gallbladder wall. A Jackson-Pratt drain was left. He tolerated the surgery well. Postoperative course has been remarkable for mild hypoxia. He was encouraged to use incentive spirometry. He was placed on nasal cannula.  The patient is being discharged home on 12/02/2011 good improving condition with the JP drain. He will be encouraged to use his incentive spirometry.  Treatments: surgery: Laparoscopic cholecystectomy on 11/30/2011  Discharge Exam: Blood pressure 130/75, pulse 87, temperature 99 F (37.2 C), temperature source Oral, resp. rate 20, height 5\' 11"  (1.803 m), weight 100.472 kg (221 lb 8 oz), SpO2 94.00%. General appearance: alert, cooperative and no distress Eyes: No scleral icterus Resp: clear to auscultation bilaterally Cardio: regular rate and rhythm, S1, S2 normal, no murmur, click, rub or gallop GI: Soft. Incisions healing well. Jackson-Pratt drainage serosanguineous in nature.  Disposition:  home   Medication List  As of 12/02/2011  7:49 AM   TAKE these medications         aspirin EC 81 MG tablet   Take 81 mg by mouth daily.      beta carotene w/minerals tablet   Take 1 tablet by mouth daily.      ciprofloxacin 500 MG tablet   Commonly known as: CIPRO   Take 1 tablet (500 mg total)  by mouth 2 (two) times daily.      HYDROcodone-acetaminophen 5-325 MG per tablet   Commonly known as: NORCO/VICODIN   Take 1-2 tablets by mouth every 4 (four) hours as needed for pain.      latanoprost 0.005 % ophthalmic solution   Commonly known as: XALATAN   Place 1 drop into both eyes at bedtime.           Follow-up Information    Follow up with Dalia Heading, MD. Schedule an appointment as soon as possible for a visit on 12/06/2011.   Contact information:   [address] [place] [address] [phone]          Signed: Dalia Heading 12/02/2011, 7:49 AM

## 2011-12-04 ENCOUNTER — Encounter (HOSPITAL_COMMUNITY): Payer: Self-pay | Admitting: General Surgery

## 2013-05-11 DIAGNOSIS — H4011X0 Primary open-angle glaucoma, stage unspecified: Secondary | ICD-10-CM | POA: Diagnosis not present

## 2013-11-16 DIAGNOSIS — H4011X0 Primary open-angle glaucoma, stage unspecified: Secondary | ICD-10-CM | POA: Diagnosis not present

## 2014-03-30 DIAGNOSIS — H6121 Impacted cerumen, right ear: Secondary | ICD-10-CM | POA: Diagnosis not present

## 2014-06-26 DIAGNOSIS — M5431 Sciatica, right side: Secondary | ICD-10-CM | POA: Diagnosis not present

## 2014-10-25 DIAGNOSIS — H4011X2 Primary open-angle glaucoma, moderate stage: Secondary | ICD-10-CM | POA: Diagnosis not present

## 2015-01-28 ENCOUNTER — Emergency Department (HOSPITAL_COMMUNITY)
Admission: EM | Admit: 2015-01-28 | Discharge: 2015-01-28 | Disposition: A | Discharge status: Home / Self Care | Payer: Medicare Other | Attending: Emergency Medicine | Admitting: Emergency Medicine

## 2015-01-28 ENCOUNTER — Emergency Department (HOSPITAL_COMMUNITY): Payer: Medicare Other

## 2015-01-28 ENCOUNTER — Encounter (HOSPITAL_COMMUNITY): Payer: Self-pay | Admitting: Emergency Medicine

## 2015-01-28 DIAGNOSIS — R011 Cardiac murmur, unspecified: Secondary | ICD-10-CM | POA: Diagnosis not present

## 2015-01-28 DIAGNOSIS — Z79899 Other long term (current) drug therapy: Secondary | ICD-10-CM | POA: Insufficient documentation

## 2015-01-28 DIAGNOSIS — R319 Hematuria, unspecified: Secondary | ICD-10-CM | POA: Diagnosis not present

## 2015-01-28 DIAGNOSIS — N3289 Other specified disorders of bladder: Secondary | ICD-10-CM

## 2015-01-28 DIAGNOSIS — Z7982 Long term (current) use of aspirin: Secondary | ICD-10-CM | POA: Insufficient documentation

## 2015-01-28 DIAGNOSIS — H409 Unspecified glaucoma: Secondary | ICD-10-CM | POA: Insufficient documentation

## 2015-01-28 DIAGNOSIS — N329 Bladder disorder, unspecified: Secondary | ICD-10-CM | POA: Insufficient documentation

## 2015-01-28 DIAGNOSIS — N200 Calculus of kidney: Secondary | ICD-10-CM | POA: Insufficient documentation

## 2015-01-28 DIAGNOSIS — N390 Urinary tract infection, site not specified: Secondary | ICD-10-CM | POA: Insufficient documentation

## 2015-01-28 LAB — CBC WITH DIFFERENTIAL/PLATELET
BASOS ABS: 0.1 10*3/uL (ref 0.0–0.1)
Basophils Relative: 1 %
EOS PCT: 4 %
Eosinophils Absolute: 0.3 10*3/uL (ref 0.0–0.7)
HCT: 37.4 % — ABNORMAL LOW (ref 39.0–52.0)
Hemoglobin: 12.2 g/dL — ABNORMAL LOW (ref 13.0–17.0)
LYMPHS ABS: 1.4 10*3/uL (ref 0.7–4.0)
LYMPHS PCT: 21 %
MCH: 30.4 pg (ref 26.0–34.0)
MCHC: 32.6 g/dL (ref 30.0–36.0)
MCV: 93.3 fL (ref 78.0–100.0)
MONO ABS: 0.5 10*3/uL (ref 0.1–1.0)
Monocytes Relative: 8 %
Neutro Abs: 4.4 10*3/uL (ref 1.7–7.7)
Neutrophils Relative %: 66 %
PLATELETS: 160 10*3/uL (ref 150–400)
RBC: 4.01 MIL/uL — ABNORMAL LOW (ref 4.22–5.81)
RDW: 14 % (ref 11.5–15.5)
WBC: 6.7 10*3/uL (ref 4.0–10.5)

## 2015-01-28 LAB — COMPREHENSIVE METABOLIC PANEL
ALBUMIN: 3.4 g/dL — AB (ref 3.5–5.0)
ALT: 27 U/L (ref 17–63)
AST: 41 U/L (ref 15–41)
Alkaline Phosphatase: 156 U/L — ABNORMAL HIGH (ref 38–126)
Anion gap: 6 (ref 5–15)
BILIRUBIN TOTAL: 1.3 mg/dL — AB (ref 0.3–1.2)
BUN: 14 mg/dL (ref 6–20)
CO2: 23 mmol/L (ref 22–32)
Calcium: 8.6 mg/dL — ABNORMAL LOW (ref 8.9–10.3)
Chloride: 108 mmol/L (ref 101–111)
Creatinine, Ser: 0.96 mg/dL (ref 0.61–1.24)
GFR calc Af Amer: >60 mL/min (ref >60)
GFR calc non Af Amer: >60 mL/min (ref >60)
GLUCOSE: 134 mg/dL — AB (ref 65–99)
POTASSIUM: 4.1 mmol/L (ref 3.5–5.1)
Sodium: 137 mmol/L (ref 135–145)
TOTAL PROTEIN: 6.8 g/dL (ref 6.5–8.1)

## 2015-01-28 LAB — URINALYSIS, ROUTINE W REFLEX MICROSCOPIC
GLUCOSE, UA: NEGATIVE mg/dL
Ketones, ur: NEGATIVE mg/dL
Nitrite: POSITIVE — AB
PH: 5.5 (ref 5.0–8.0)
PROTEIN: 100 mg/dL — AB
SPECIFIC GRAVITY, URINE: 1.02 (ref 1.005–1.030)
Urobilinogen, UA: 1 mg/dL (ref 0.0–1.0)

## 2015-01-28 LAB — POC OCCULT BLOOD, ED: Fecal Occult Bld: NEGATIVE

## 2015-01-28 LAB — URINE MICROSCOPIC-ADD ON

## 2015-01-28 MED ORDER — CIPROFLOXACIN IN D5W 400 MG/200ML IV SOLN
400.0000 mg | Freq: Once | INTRAVENOUS | Status: AC
  Administered 2015-01-28: 400 mg via INTRAVENOUS
  Filled 2015-01-28: qty 200

## 2015-01-28 MED ORDER — CIPROFLOXACIN HCL 500 MG PO TABS: 500.0000 mg | ORAL_TABLET | Freq: Two times a day (BID) | ORAL | Status: DC

## 2015-01-28 NOTE — ED Notes (Signed)
Pt reports blood in urine since last night. Pt states he has urinated 4-5 times since then. Pt denies any pain. Denies v/d. Denies urinary difficulty.

## 2015-01-28 NOTE — ED Notes (Signed)
PA at bedside.

## 2015-01-28 NOTE — ED Provider Notes (Signed)
CSN: [id number]     Arrival date & time 01/28/15  8591 History  By signing my name below, I, Ronney Lion, attest that this documentation has been prepared under the direction and in the presence of Ivery Quale, PA-C. Electronically Signed: Ronney Lion, ED Scribe. 01/28/2015. 10:16 AM.    Chief Complaint  Patient presents with  . Hematuria   Patient is a 81 y.o. male presenting with hematuria. The history is provided by the patient. No language interpreter was used.  Hematuria This is a new problem. The current episode started 12 to 24 hours ago. Pertinent negatives include no chest pain, no abdominal pain, no headaches and no shortness of breath. Nothing aggravates the symptoms. Nothing relieves the symptoms. He has tried nothing for the symptoms.    HPI Comments: Gabriel Spencer is a 80 y.o. male who presents to the Emergency Department complaining of hematuria that began last night. He states he has no other complaints or symptoms at this time besides what appeared to be blood in his urine last night. He denies any recent pelvic surgeries although he notes a history of cholecystectomy 3 years ago. He reports he takes a baby aspirin but otherwise is not on any anticoagulation. He denies a history of any prostrate conditions. He denies dysuria or any known fever.   Past Medical History  Diagnosis Date  . Glaucoma    Past Surgical History  Procedure Laterality Date  . Cataract extraction, bilateral    . Retinal detachment surgery      right eye  . Lymph node biopsy  1975    right  . Tympanoplasty      right  . Cholecystectomy  11/30/2011    Procedure: LAPAROSCOPIC CHOLECYSTECTOMY;  Surgeon: Dalia Heading, MD;  Location: [facility];  Service: General;  Laterality: N/A;   No family history on file. Social History  Substance Use Topics  . Smoking status: Never Smoker   . Smokeless tobacco: None  . Alcohol Use: No    Review of Systems  Constitutional: Negative for fever.  Respiratory:  Negative for shortness of breath.   Cardiovascular: Negative for chest pain.  Gastrointestinal: Negative for abdominal pain.  Genitourinary: Positive for hematuria. Negative for dysuria.  Neurological: Negative for headaches.  All other systems reviewed and are negative.  Allergies  Keflex  Home Medications   Prior to Admission medications   Medication Sig Start Date End Date Taking? Authorizing Provider  aspirin EC 81 MG tablet Take 81 mg by mouth daily.    Historical Provider, MD  beta carotene w/minerals (OCUVITE) tablet Take 1 tablet by mouth daily.    Historical Provider, MD  latanoprost (XALATAN) 0.005 % ophthalmic solution Place 1 drop into both eyes at bedtime.    Historical Provider, MD   BP 172/71 mmHg  Pulse 85  Temp(Src) 97.5 F (36.4 C) (Oral)  Resp 18  Ht 5\' 11"  (1.803 m)  Wt 220 lb (99.791 kg)  BMI 30.70 kg/m2  SpO2 97% Physical Exam  Constitutional: He is oriented to person, place, and time. He appears well-developed and well-nourished. No distress.  HENT:  Head: Normocephalic and atraumatic.  Eyes: Conjunctivae and EOM are normal.  Neck: Neck supple. No tracheal deviation present.  Cardiovascular: Normal rate.   Murmur heard. Pulmonary/Chest: Effort normal. No respiratory distress.  Genitourinary:  No bladder distention palpated. The patient is uncircumcised. No drainage or discharge from the penis. No rash of the penis. No mass noted of the testicle area.  Rectal exam: The prostate is enlarged and firm. There is no blood noted on the gloved finger. No rectal mass appreciated.    Musculoskeletal: Normal range of motion.  Neurological: He is alert and oriented to person, place, and time.  Skin: Skin is warm and dry.  Psychiatric: He has a normal mood and affect. His behavior is normal.  Nursing note and vitals reviewed.   ED Course  Pt seen with me by Dr Wyvonnia Dusky. Chart reviewed.  [facility] [place] made aware of this patient and the  diagnosis.  Procedures (including critical care time)  DIAGNOSTIC STUDIES: Oxygen Saturation is 97% on RA, normal by my interpretation.    COORDINATION OF CARE: 9:28 AM - Discussed treatment plan with pt at bedside which includes UA. Pt verbalized understanding and agreed to plan.   Labs Review Labs Reviewed  URINALYSIS, ROUTINE W REFLEX MICROSCOPIC (NOT AT [facility]) - Abnormal; Notable for the following:    Color, Urine RED (*)    APPearance CLOUDY (*)    Hgb urine dipstick LARGE (*)    Bilirubin Urine SMALL (*)    Protein, ur 100 (*)    Nitrite POSITIVE (*)    Leukocytes, UA TRACE (*)    All other components within normal limits  COMPREHENSIVE METABOLIC PANEL - Abnormal; Notable for the following:    Glucose, Bld 134 (*)    Calcium 8.6 (*)    Albumin 3.4 (*)    Alkaline Phosphatase 156 (*)    Total Bilirubin 1.3 (*)    All other components within normal limits  CBC WITH DIFFERENTIAL/PLATELET - Abnormal; Notable for the following:    RBC 4.01 (*)    Hemoglobin 12.2 (*)    HCT 37.4 (*)    All other components within normal limits  URINE MICROSCOPIC-ADD ON - Abnormal; Notable for the following:    Bacteria, UA MANY (*)    All other components within normal limits  POC OCCULT BLOOD, ED     MDM  Vital signs reviewed. Glucose and ALK Phos and Total Bili slightly elevated, o/w CMet wnl. CBC non-acute. UA reveals cloudy red specimen with pos nitrates and leukocytes. TMTC RBC's and many bacteria. Culture sent to the lab. POC  Stool for occult blood Neg. CT reveals 28x13 mass, concerning for malignancy. Diverticulosis of the decending colon present. Small nonobstructive renal calculus also noted.  Results discussed with the patient and his wife. I have strongly encouraged them to call [facility] for appointment as soon as possible. Rx for cipro given to the patient. (Pt is allergic to keflex) They will return to the Emergency Dept if any changes or concerns.  Spoke with  [facility] [place]. They are aware of pt's diagnosis.   Final diagnoses:  None    **I have reviewed nursing notes, vital signs, and all appropriate lab and imaging results for this patient.* I personally performed the services described in this documentation, which was scribed in my presence. The recorded information has been reviewed and is accurate.    Lily Kocher, PA-C 01/28/15 [place], MD 01/28/15 [phone]

## 2015-01-28 NOTE — Discharge Instructions (Signed)
Your urine test reveals a urinary tract infection. I have since your urine to the lab to be cultured. In the meantime please use Cipro 2 times daily with food until all taken. Your CT scan suggested there is a mass in your bladder. It is extremely important that you see the physicians at the [facility]. They have an office here in [place] area. Please call them and set up an appointment as sone as possible. Urinary Tract Infection A urinary tract infection (UTI) can occur any place along the urinary tract. The tract includes the kidneys, ureters, bladder, and urethra. A type of germ called bacteria often causes a UTI. UTIs are often helped with antibiotic medicine.  HOME CARE   If given, take antibiotics as told by your doctor. Finish them even if you start to feel better.  Drink enough fluids to keep your pee (urine) clear or pale yellow.  Avoid tea, drinks with caffeine, and bubbly (carbonated) drinks.  Pee often. Avoid holding your pee in for a long time.  Pee before and after having sex (intercourse).  Wipe from front to back after you poop (bowel movement) if you are a woman. Use each tissue only once. GET HELP RIGHT AWAY IF:   You have back pain.  You have lower belly (abdominal) pain.  You have chills.  You feel sick to your stomach (nauseous).  You throw up (vomit).  Your burning or discomfort with peeing does not go away.  You have a fever.  Your symptoms are not better in 3 days. MAKE SURE YOU:   Understand these instructions.  Will watch your condition.  Will get help right away if you are not doing well or get worse.   This information is not intended to replace advice given to you by your health care provider. Make sure you discuss any questions you have with your health care provider.   Document Released: 08/29/2007 Document Revised: 04/02/2014 Document Reviewed: 10/11/2011 Elsevier Interactive Patient Education 2016 Elsevier  Inc.  Hematuria, Adult Hematuria is blood in your urine. It can be caused by a bladder infection, kidney infection, prostate infection, kidney stone, or cancer of your urinary tract. Infections can usually be treated with medicine, and a kidney stone usually will pass through your urine. If neither of these is the cause of your hematuria, further workup to find out the reason may be needed. It is very important that you tell your health care provider about any blood you see in your urine, even if the blood stops without treatment or happens without causing pain. Blood in your urine that happens and then stops and then happens again can be a symptom of a very serious condition. Also, pain is not a symptom in the initial stages of many urinary cancers. HOME CARE INSTRUCTIONS   Drink lots of fluid, 3-4 quarts a day. If you have been diagnosed with an infection, cranberry juice is especially recommended, in addition to large amounts of water.  Avoid caffeine, tea, and carbonated beverages because they tend to irritate the bladder.  Avoid alcohol because it may irritate the prostate.  Take all medicines as directed by your health care provider.  If you were prescribed an antibiotic medicine, finish it all even if you start to feel better.  If you have been diagnosed with a kidney stone, follow your health care provider's instructions regarding straining your urine to catch the stone.  Empty your bladder often. Avoid holding urine for long periods of time.  After a bowel movement, women should cleanse front to back. Use each tissue only once.  Empty your bladder before and after sexual intercourse if you are a male. SEEK MEDICAL CARE IF:  You develop back pain.  You have a fever.  You have a feeling of sickness in your stomach (nausea) or vomiting.  Your symptoms are not better in 3 days. Return sooner if you are getting worse. SEEK IMMEDIATE MEDICAL CARE IF:   You develop severe  vomiting and are unable to keep the medicine down.  You develop severe back or abdominal pain despite taking your medicines.  You begin passing a large amount of blood or clots in your urine.  You feel extremely weak or faint, or you pass out. MAKE SURE YOU:   Understand these instructions.  Will watch your condition.  Will get help right away if you are not doing well or get worse.   This information is not intended to replace advice given to you by your health care provider. Make sure you discuss any questions you have with your health care provider.   Document Released: 03/12/2005 Document Revised: 04/02/2014 Document Reviewed: 11/10/2012 Elsevier Interactive Patient Education [employer].

## 2015-01-31 LAB — URINE CULTURE
Culture: NO GROWTH
Special Requests: NORMAL

## 2015-02-11 ENCOUNTER — Ambulatory Visit (INDEPENDENT_AMBULATORY_CARE_PROVIDER_SITE_OTHER): Payer: Medicare Other | Admitting: Urology

## 2015-02-11 DIAGNOSIS — N3289 Other specified disorders of bladder: Secondary | ICD-10-CM | POA: Diagnosis not present

## 2015-02-11 DIAGNOSIS — N471 Phimosis: Secondary | ICD-10-CM

## 2015-02-11 DIAGNOSIS — N481 Balanitis: Secondary | ICD-10-CM

## 2015-02-11 DIAGNOSIS — R310 Gross hematuria: Secondary | ICD-10-CM

## 2015-02-11 DIAGNOSIS — C672 Malignant neoplasm of lateral wall of bladder: Secondary | ICD-10-CM

## 2015-02-23 DIAGNOSIS — C670 Malignant neoplasm of trigone of bladder: Secondary | ICD-10-CM | POA: Diagnosis not present

## 2015-02-23 DIAGNOSIS — Z8669 Personal history of other diseases of the nervous system and sense organs: Secondary | ICD-10-CM | POA: Diagnosis not present

## 2015-02-24 ENCOUNTER — Other Ambulatory Visit: Payer: Self-pay | Admitting: Urology

## 2015-03-07 NOTE — Patient Instructions (Signed)
Gabriel Spencer  03/07/2015   Your procedure is scheduled on: 03/10/2015    Report to [facility] Main  Entrance take [facility]  elevators to 3rd floor to  [place] at    [phone].  Call this number if you have problems the morning of surgery [phone]   Remember: ONLY 1 PERSON MAY GO WITH YOU TO SHORT STAY TO GET  READY MORNING OF [place].  Do not eat food or drink liquids :After Midnight.     Take these medicines the morning of surgery with A SIP OF WATER: Alphagan eye drops                                 You may not have any metal on your body including hair pins and              piercings  Do not wear jewelry, , lotions, powders or perfumes, deodorant                           Men may shave face and neck.   Do not bring valuables to the hospital. [place].  Contacts, dentures or bridgework may not be worn into surgery.  Leave suitcase in the car. After surgery it may be brought to your room.     Special Instructions: coughing and deep breathing exercises, leg exercises               Please read over the following fact sheets you were given: _____________________________________________________________________             [facility] - Preparing for Surgery Before surgery, you can play an important role.  Because skin is not sterile, your skin needs to be as free of germs as possible.  You can reduce the number of germs on your skin by washing with CHG (chlorahexidine gluconate) soap before surgery.  CHG is an antiseptic cleaner which kills germs and bonds with the skin to continue killing germs even after washing. Please DO NOT use if you have an allergy to CHG or antibacterial soaps.  If your skin becomes reddened/irritated stop using the CHG and inform your nurse when you arrive at Short Stay. Do not shave (including legs and underarms) for at least 48 hours prior to the first CHG shower.   You may shave your face/neck. Please follow these instructions carefully:  1.  Shower with CHG Soap the night before surgery and the  morning of Surgery.  2.  If you choose to wash your hair, wash your hair first as usual with your  normal  shampoo.  3.  After you shampoo, rinse your hair and body thoroughly to remove the  shampoo.                           4.  Use CHG as you would any other liquid soap.  You can apply chg directly  to the skin and wash                       Gently with a scrungie or clean washcloth.  5.  Apply  the CHG Soap to your body ONLY FROM THE NECK DOWN.   Do not use on face/ open                           Wound or open sores. Avoid contact with eyes, ears mouth and genitals (private parts).                       Wash face,  Genitals (private parts) with your normal soap.             6.  Wash thoroughly, paying special attention to the area where your surgery  will be performed.  7.  Thoroughly rinse your body with warm water from the neck down.  8.  DO NOT shower/wash with your normal soap after using and rinsing off  the CHG Soap.                9.  Pat yourself dry with a clean towel.            10.  Wear clean pajamas.            11.  Place clean sheets on your bed the night of your first shower and do not  sleep with pets. Day of Surgery : Do not apply any lotions/deodorants the morning of surgery.  Please wear clean clothes to the hospital/surgery center.  FAILURE TO FOLLOW THESE INSTRUCTIONS MAY RESULT IN THE CANCELLATION OF YOUR SURGERY PATIENT SIGNATURE_________________________________  NURSE SIGNATURE__________________________________  ________________________________________________________________________

## 2015-03-08 ENCOUNTER — Encounter (HOSPITAL_COMMUNITY): Payer: Self-pay

## 2015-03-08 ENCOUNTER — Encounter (HOSPITAL_COMMUNITY)
Admission: RE | Admit: 2015-03-08 | Discharge: 2015-03-08 | Disposition: A | Discharge status: Home / Self Care | Payer: Medicare Other | Source: Ambulatory Visit | Attending: Urology | Admitting: Urology

## 2015-03-08 DIAGNOSIS — N471 Phimosis: Secondary | ICD-10-CM | POA: Diagnosis not present

## 2015-03-08 DIAGNOSIS — Z79899 Other long term (current) drug therapy: Secondary | ICD-10-CM | POA: Diagnosis not present

## 2015-03-08 DIAGNOSIS — Z7982 Long term (current) use of aspirin: Secondary | ICD-10-CM | POA: Diagnosis not present

## 2015-03-08 DIAGNOSIS — R0602 Shortness of breath: Secondary | ICD-10-CM | POA: Diagnosis not present

## 2015-03-08 DIAGNOSIS — N481 Balanitis: Secondary | ICD-10-CM | POA: Diagnosis not present

## 2015-03-08 DIAGNOSIS — Z9049 Acquired absence of other specified parts of digestive tract: Secondary | ICD-10-CM | POA: Diagnosis not present

## 2015-03-08 DIAGNOSIS — M109 Gout, unspecified: Secondary | ICD-10-CM | POA: Diagnosis not present

## 2015-03-08 DIAGNOSIS — H409 Unspecified glaucoma: Secondary | ICD-10-CM | POA: Diagnosis not present

## 2015-03-08 DIAGNOSIS — Z87891 Personal history of nicotine dependence: Secondary | ICD-10-CM | POA: Diagnosis not present

## 2015-03-08 DIAGNOSIS — N329 Bladder disorder, unspecified: Secondary | ICD-10-CM | POA: Diagnosis present

## 2015-03-08 DIAGNOSIS — M1990 Unspecified osteoarthritis, unspecified site: Secondary | ICD-10-CM | POA: Diagnosis not present

## 2015-03-08 DIAGNOSIS — C672 Malignant neoplasm of lateral wall of bladder: Secondary | ICD-10-CM | POA: Diagnosis not present

## 2015-03-08 HISTORY — DX: Unspecified osteoarthritis, unspecified site: M19.90

## 2015-03-08 LAB — BASIC METABOLIC PANEL
Anion gap: 7 (ref 5–15)
BUN: 15 mg/dL (ref 6–20)
CALCIUM: 9.5 mg/dL (ref 8.9–10.3)
CHLORIDE: 105 mmol/L (ref 101–111)
CO2: 27 mmol/L (ref 22–32)
CREATININE: 0.92 mg/dL (ref 0.61–1.24)
GFR calc Af Amer: >60 mL/min (ref >60)
Glucose, Bld: 115 mg/dL — ABNORMAL HIGH (ref 65–99)
Potassium: 4.5 mmol/L (ref 3.5–5.1)
SODIUM: 139 mmol/L (ref 135–145)

## 2015-03-08 LAB — CBC
HCT: 39.8 % (ref 39.0–52.0)
Hemoglobin: 13 g/dL (ref 13.0–17.0)
MCH: 29.6 pg (ref 26.0–34.0)
MCHC: 32.7 g/dL (ref 30.0–36.0)
MCV: 90.7 fL (ref 78.0–100.0)
PLATELETS: 172 10*3/uL (ref 150–400)
RBC: 4.39 MIL/uL (ref 4.22–5.81)
RDW: 13.5 % (ref 11.5–15.5)
WBC: 7.2 10*3/uL (ref 4.0–10.5)

## 2015-03-08 NOTE — Progress Notes (Signed)
Clearance - Dr Anastasio Champion- 02/15/15 on chart

## 2015-03-09 NOTE — Anesthesia Preprocedure Evaluation (Addendum)
Anesthesia Evaluation  Patient identified by MRN, date of birth, ID band Patient awake    Reviewed: Allergy & Precautions, H&P , NPO status , Patient's Chart, lab work & pertinent test results  Airway Mallampati: II  TM Distance: >3 FB Neck ROM: full    Dental no notable dental hx. (+) Dental Advisory Given, Teeth Intact   Pulmonary neg pulmonary ROS, shortness of breath and with exertion, former smoker,    Pulmonary exam normal breath sounds clear to auscultation       Cardiovascular Exercise Tolerance: Good negative cardio ROS Normal cardiovascular exam Rhythm:regular Rate:Normal     Neuro/Psych glaucoma negative neurological ROS  negative psych ROS   GI/Hepatic negative GI ROS, Neg liver ROS,   Endo/Other  negative endocrine ROS  Renal/GU negative Renal ROS  negative genitourinary   Musculoskeletal   Abdominal   Peds  Hematology negative hematology ROS (+)   Anesthesia Other Findings   Reproductive/Obstetrics negative OB ROS                          Anesthesia Physical Anesthesia Plan  ASA: III  Anesthesia Plan: General   Post-op Pain Management:    Induction: Intravenous  Airway Management Planned: LMA  Additional Equipment:   Intra-op Plan:   Post-operative Plan:   Informed Consent: I have reviewed the patients History and Physical, chart, labs and discussed the procedure including the risks, benefits and alternatives for the proposed anesthesia with the patient or authorized representative who has indicated his/her understanding and acceptance.   Dental Advisory Given  Plan Discussed with: CRNA and Surgeon  Anesthesia Plan Comments:        Anesthesia Quick Evaluation

## 2015-03-09 NOTE — H&P (Signed)
Active Problems  1. Balanitis (N48.1)  2. Bladder mass (N32.89)  3. Gross hematuria (R31.0)  4. 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 Phimosis (N47.1)  History of Present Illness    [name] is an 81 yo WM who was seen in the ER at [facility] on 11/4 for gross hematuria.  The bleeding started 2-3 days prior.  He had no clots.  He had no dysuria or flank pain.  He had no fever.  He had a UA that looked infected and a CT was done which showed a 2.8cm bladder wall lesion worrisome for malignancy.  He was given cipro which he completed a week ago and the urine cleared after 2 days. He is asymptomatic now.  He has no other significant voiding complaints.  He has nocturia x 2.  He has had no prior GU history.     Past Medical History  1. History of arthritis (Z87.39)  2. History of glaucoma (Z86.69)  3. History of gout (Z87.39)  Surgical History  1. History of Cholecystectomy  2. History of Ear Surgery  3. History of Eye Surgery  Current Meds  1. Alphagan P 0.1 % Ophthalmic Solution;  Therapy: (Recorded:18Nov2016) to Recorded  2. Aspirin 81 MG TABS;  Therapy: (Recorded:18Nov2016) to Recorded  3. PreserVision AREDS CAPS;  Therapy: (Recorded:18Nov2016) to Recorded  4. Xalatan 0.005 % Ophthalmic Solution (Latanoprost);  Therapy: (Recorded:18Nov2016) to Recorded  Allergies  1. Keflex CAPS  Family History  1. Family history of Deceased : Mother, Father  2. Family history of diabetes mellitus (Z83.3) : Sister  3. Family history of renal failure (Z84.1) : Sister  Social History   Caffeine use (F15.90)   2 p/d   Does not use tobacco (Z78.9)   Married   Number of children   1 boy 1 girl   Retired  Review of Systems Genitourinary, constitutional, skin, eye, otolaryngeal, hematologic/lymphatic, cardiovascular, pulmonary, endocrine, musculoskeletal, gastrointestinal, neurological and psychiatric system(s) were reviewed and pertinent findings if present are noted  and are otherwise negative.  Genitourinary: nocturia and hematuria.  Gastrointestinal: diarrhea and constipation.  Constitutional: fever.  Eyes: problems with eyesight.    Vitals Vital Signs [Data Includes: Last 1 Day]  Recorded: [id number] 08:30AM  Height: 5 ft 11 in Weight: 220 lb  BMI Calculated: 30.68 BSA Calculated: 2.2 Blood Pressure: 150 / 78 Temperature: 98 F Heart Rate: 80  Physical Exam Constitutional: Well nourished and well developed . No acute distress.   ENT:. The ears and nose are normal in appearance. He is legally blind.   Neck: The appearance of the neck is normal and no neck mass is present.   Pulmonary: No respiratory distress and normal respiratory rhythm and effort.   Cardiovascular: Heart rate and rhythm are normal . No peripheral edema.   Abdomen: The abdomen is mildly obese. The abdomen is soft and nontender. No masses are palpated. No CVA tenderness. No hernias are palpable. No hepatosplenomegaly noted.   Rectal: Rectal exam demonstrates normal sphincter tone, no tenderness and no masses. Estimated prostate size is 2+. The prostate has no nodularity and is not tender. The left seminal vesicle is nonpalpable. The right seminal vesicle is nonpalpable. The perineum is normal on inspection.   Genitourinary: Examination of the penis demonstrates phimosis and balanitis, but no discharge, no masses, no lesions and a normal meatus. The penis is uncircumcised. The scrotum is without lesions. The right epididymis is palpably normal and non-tender. The left epididymis is palpably normal  and non-tender. The right testis is atrophic, but non-tender and without masses. The left testis is atrophic, but non-tender and without masses.   Lymphatics: The posterior cervical, supraclavicular, axillary, femoral and inguinal nodes are not enlarged or tender.   Skin: Normal skin turgor, no visible rash and no visible skin lesions.   Neuro/Psych:. Mood and affect are  appropriate. Normal sensation of the perineum/perianal region (S3,4,5).    Results/Data Urine [Data Includes: Last 1 Day]   [id number]  COLOR YELLOW   APPEARANCE CLEAR   SPECIFIC GRAVITY 1.020   pH 5.5   GLUCOSE NEGATIVE   BILIRUBIN NEGATIVE   KETONE NEGATIVE   BLOOD 3+   PROTEIN NEGATIVE   NITRITE NEGATIVE   LEUKOCYTE ESTERASE 1+   SQUAMOUS EPITHELIAL/HPF 0-5 HPF  WBC 0-5 WBC/HPF  RBC 10-20 RBC/HPF  BACTERIA FEW HPF  CRYSTALS NONE SEEN HPF  CASTS NONE SEEN LPF  Yeast NONE SEEN HPF   Old records or history reviewed: ER records and labs reviewed.  The following images/tracing/specimen were independently visualized:  CT films and reports reviewed from 2013 and his recent ER visit. There was a subtle thickening at the left base in 2013 and the 2.8cm mass now.  The following clinical lab reports were reviewed:  UA reviewed. HIs culture was negative from 11/4. Cr is normal.    Procedure  Procedure: Cystoscopy   Indication: Hematuria. Bladder Mass.  Informed Consent: Risks, benefits, and potential adverse events were discussed and informed consent was obtained from the patient.  Prep: The patient was prepped with betadine.  Antibiotic prophylaxis: Ciprofloxacin.  Procedure Note:  Urethral meatus:. No abnormalities.  Anterior urethra: No abnormalities.  Prostatic urethra: No abnormalities . Estimated length was 3 cm. There was visual obstruction of the prostatic urethra. The lateral prostatic lobes were enlarged.  Bladder: Visulization was clear. The ureteral orifices were in the normal anatomic position bilaterally and had clear efflux of urine. The mucosa was smooth without abnormalities. Examination of the bladder demonstrated mild trabeculation. A solitary tumor was visualized in the bladder. A papillary tumor was seen in the bladder measuring approximately 3 cm in size. The patient tolerated the procedure well.  Complications: None.    Assessment  1. Gross hematuria (R31.0)   2. Bladder mass (N32.89)  3. Phimosis (N47.1)  4. Balanitis (N48.1)  5. 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 He has phimosis and balanitis related to the recent antibiotic.   Plan Balanitis   1. Start: Nystatin 100000 UNIT/GM External Ointment; apply to affected area sparingly 2-3x  daily Bladder mass   2. Cysto; Status:Hold For - Appointment,Date of Service; Requested for:18Nov2016;   3. UA With REFLEX; [Do Not Release]; Status:Hold For - [employer];  Requested for:16Nov2016;  Malignant neoplasm of lateral wall of bladder   4. Follow-up Schedule Surgery Office  Follow-up  Status: Hold For - Appointment   Requested for: [id number]   I have sent Nystatin cream for the balanitis. I am going to get him set up for cystoscopy with Bil RTG's, TURBT, possible instillation of [place].   I reviewed teh risks of bleeding, infection, bladder and ureteral injury, stricturing, chemical cystitis, thrombotic events and anesthetic complications.   I will have him seen by his medical doctor since he hasn't had a recent check up.   I discussed the usual f/u protocol for bladder cancer and the possible need for additional therapy.  Discussion/Summary  CC: [facility].

## 2015-03-10 ENCOUNTER — Ambulatory Visit (HOSPITAL_COMMUNITY): Payer: Medicare Other | Admitting: Anesthesiology

## 2015-03-10 ENCOUNTER — Observation Stay (HOSPITAL_COMMUNITY)
Admission: RE | Admit: 2015-03-10 | Discharge: 2015-03-11 | Disposition: A | Discharge status: Home / Self Care | Payer: Medicare Other | Source: Ambulatory Visit | Attending: Urology | Admitting: Urology

## 2015-03-10 ENCOUNTER — Encounter (HOSPITAL_COMMUNITY): Payer: Self-pay | Admitting: *Deleted

## 2015-03-10 ENCOUNTER — Encounter (HOSPITAL_COMMUNITY)
Admission: RE | Disposition: A | Discharge status: Home / Self Care | Payer: Self-pay | Source: Ambulatory Visit | Attending: Urology

## 2015-03-10 DIAGNOSIS — D494 Neoplasm of unspecified behavior of bladder: Secondary | ICD-10-CM | POA: Diagnosis not present

## 2015-03-10 DIAGNOSIS — H409 Unspecified glaucoma: Secondary | ICD-10-CM | POA: Insufficient documentation

## 2015-03-10 DIAGNOSIS — Z7982 Long term (current) use of aspirin: Secondary | ICD-10-CM | POA: Insufficient documentation

## 2015-03-10 DIAGNOSIS — N481 Balanitis: Secondary | ICD-10-CM | POA: Insufficient documentation

## 2015-03-10 DIAGNOSIS — N471 Phimosis: Secondary | ICD-10-CM | POA: Insufficient documentation

## 2015-03-10 DIAGNOSIS — M109 Gout, unspecified: Secondary | ICD-10-CM | POA: Insufficient documentation

## 2015-03-10 DIAGNOSIS — C672 Malignant neoplasm of lateral wall of bladder: Secondary | ICD-10-CM | POA: Diagnosis not present

## 2015-03-10 DIAGNOSIS — Z79899 Other long term (current) drug therapy: Secondary | ICD-10-CM | POA: Insufficient documentation

## 2015-03-10 DIAGNOSIS — Z87891 Personal history of nicotine dependence: Secondary | ICD-10-CM | POA: Insufficient documentation

## 2015-03-10 DIAGNOSIS — Z9049 Acquired absence of other specified parts of digestive tract: Secondary | ICD-10-CM | POA: Insufficient documentation

## 2015-03-10 DIAGNOSIS — M1990 Unspecified osteoarthritis, unspecified site: Secondary | ICD-10-CM | POA: Insufficient documentation

## 2015-03-10 DIAGNOSIS — C679 Malignant neoplasm of bladder, unspecified: Secondary | ICD-10-CM | POA: Diagnosis present

## 2015-03-10 DIAGNOSIS — R0602 Shortness of breath: Secondary | ICD-10-CM | POA: Insufficient documentation

## 2015-03-10 DIAGNOSIS — Z8551 Personal history of malignant neoplasm of bladder: Secondary | ICD-10-CM | POA: Diagnosis present

## 2015-03-10 HISTORY — PX: CYSTOSCOPY/RETROGRADE/URETEROSCOPY: SHX5316

## 2015-03-10 HISTORY — PX: TRANSURETHRAL RESECTION OF BLADDER TUMOR: SHX2575

## 2015-03-10 SURGERY — CYSTOSCOPY/RETROGRADE/URETEROSCOPY
Anesthesia: General | Site: Bladder | Laterality: Bilateral

## 2015-03-10 MED ORDER — ACETAMINOPHEN 325 MG PO TABS: 650.0000 mg | ORAL_TABLET | ORAL | Status: DC | PRN

## 2015-03-10 MED ORDER — LACTATED RINGERS IV SOLN
INTRAVENOUS | Status: DC | PRN
  Administered 2015-03-10: 07:00:00 via INTRAVENOUS

## 2015-03-10 MED ORDER — 0.9 % SODIUM CHLORIDE (POUR BTL) OPTIME
TOPICAL | Status: DC | PRN
  Administered 2015-03-10: 1000 mL

## 2015-03-10 MED ORDER — MITOMYCIN CHEMO FOR BLADDER INSTILLATION 40 MG
40.0000 mg | Freq: Once | INTRAVENOUS | Status: DC
  Administered 2015-03-10: 40 mg via INTRAVESICAL
  Filled 2015-03-10: qty 40

## 2015-03-10 MED ORDER — PROPOFOL 10 MG/ML IV BOLUS
INTRAVENOUS | Status: DC | PRN
Start: 2015-03-10 — End: 2015-03-10
  Administered 2015-03-10: 130 mg via INTRAVENOUS

## 2015-03-10 MED ORDER — ZOLPIDEM TARTRATE 5 MG PO TABS: 5.0000 mg | ORAL_TABLET | Freq: Every evening | ORAL | Status: DC | PRN

## 2015-03-10 MED ORDER — ONDANSETRON HCL 4 MG/2ML IJ SOLN: 4.0000 mg | INTRAMUSCULAR | Status: DC | PRN

## 2015-03-10 MED ORDER — ONDANSETRON HCL 4 MG/2ML IJ SOLN
INTRAMUSCULAR | Status: AC
  Filled 2015-03-10: qty 2

## 2015-03-10 MED ORDER — HYDROMORPHONE HCL 1 MG/ML IJ SOLN: 0.5000 mg | INTRAMUSCULAR | Status: DC | PRN

## 2015-03-10 MED ORDER — LIDOCAINE HCL (CARDIAC) 20 MG/ML IV SOLN
INTRAVENOUS | Status: DC | PRN
  Administered 2015-03-10: 100 mg via INTRAVENOUS

## 2015-03-10 MED ORDER — ONDANSETRON HCL 4 MG/2ML IJ SOLN
INTRAMUSCULAR | Status: DC | PRN
  Administered 2015-03-10: 4 mg via INTRAVENOUS

## 2015-03-10 MED ORDER — PHENAZOPYRIDINE HCL 200 MG PO TABS: 200.0000 mg | ORAL_TABLET | Freq: Three times a day (TID) | ORAL | Status: DC | PRN

## 2015-03-10 MED ORDER — PRESERVISION AREDS PO CAPS: ORAL_CAPSULE | Freq: Two times a day (BID) | ORAL | Status: DC

## 2015-03-10 MED ORDER — CIPROFLOXACIN IN D5W 400 MG/200ML IV SOLN
400.0000 mg | INTRAVENOUS | Status: AC
  Administered 2015-03-10: 400 mg via INTRAVENOUS

## 2015-03-10 MED ORDER — EPHEDRINE SULFATE 50 MG/ML IJ SOLN
INTRAMUSCULAR | Status: DC | PRN
  Administered 2015-03-10: 10 mg via INTRAVENOUS
  Administered 2015-03-10: 5 mg via INTRAVENOUS

## 2015-03-10 MED ORDER — BISACODYL 10 MG RE SUPP: 10.0000 mg | Freq: Every day | RECTAL | Status: DC | PRN

## 2015-03-10 MED ORDER — SUCCINYLCHOLINE CHLORIDE 20 MG/ML IJ SOLN
INTRAMUSCULAR | Status: DC | PRN
  Administered 2015-03-10: 100 mg via INTRAVENOUS

## 2015-03-10 MED ORDER — LATANOPROST 0.005 % OP SOLN
1.0000 [drp] | Freq: Every day | OPHTHALMIC | Status: DC
  Administered 2015-03-10: 1 [drp] via OPHTHALMIC
  Filled 2015-03-10: qty 2.5

## 2015-03-10 MED ORDER — IOHEXOL 300 MG/ML  SOLN
INTRAMUSCULAR | Status: DC | PRN
Start: 2015-03-10 — End: 2015-03-10
  Administered 2015-03-10: 15 mL via URETHRAL

## 2015-03-10 MED ORDER — FENTANYL CITRATE (PF) 100 MCG/2ML IJ SOLN: 25.0000 ug | INTRAMUSCULAR | Status: DC | PRN

## 2015-03-10 MED ORDER — DIPHENHYDRAMINE HCL 50 MG/ML IJ SOLN: 12.5000 mg | Freq: Four times a day (QID) | INTRAMUSCULAR | Status: DC | PRN

## 2015-03-10 MED ORDER — SUGAMMADEX SODIUM 500 MG/5ML IV SOLN
INTRAVENOUS | Status: AC
  Filled 2015-03-10: qty 5

## 2015-03-10 MED ORDER — PHENYLEPHRINE 40 MCG/ML (10ML) SYRINGE FOR IV PUSH (FOR BLOOD PRESSURE SUPPORT)
PREFILLED_SYRINGE | INTRAVENOUS | Status: AC
  Filled 2015-03-10: qty 10

## 2015-03-10 MED ORDER — ROCURONIUM BROMIDE 100 MG/10ML IV SOLN
INTRAVENOUS | Status: DC | PRN
  Administered 2015-03-10: 5 mg via INTRAVENOUS
  Administered 2015-03-10: 10 mg via INTRAVENOUS
  Administered 2015-03-10: 30 mg via INTRAVENOUS

## 2015-03-10 MED ORDER — KCL IN DEXTROSE-NACL 20-5-0.45 MEQ/L-%-% IV SOLN
INTRAVENOUS | Status: DC
  Administered 2015-03-10 (×3): via INTRAVENOUS
  Filled 2015-03-10 (×3): qty 1000

## 2015-03-10 MED ORDER — DIPHENHYDRAMINE HCL 12.5 MG/5ML PO ELIX: 12.5000 mg | ORAL_SOLUTION | Freq: Four times a day (QID) | ORAL | Status: DC | PRN

## 2015-03-10 MED ORDER — PROPOFOL 10 MG/ML IV BOLUS
INTRAVENOUS | Status: AC
  Filled 2015-03-10: qty 20

## 2015-03-10 MED ORDER — PHENYLEPHRINE HCL 10 MG/ML IJ SOLN
INTRAMUSCULAR | Status: AC
  Filled 2015-03-10: qty 2

## 2015-03-10 MED ORDER — OCUVITE-LUTEIN PO CAPS
1.0000 | ORAL_CAPSULE | Freq: Every day | ORAL | Status: DC
Start: 2015-03-10 — End: 2015-03-11
  Administered 2015-03-11: 1 via ORAL
  Filled 2015-03-10 (×2): qty 1

## 2015-03-10 MED ORDER — BRIMONIDINE TARTRATE 0.15 % OP SOLN
1.0000 [drp] | Freq: Two times a day (BID) | OPHTHALMIC | Status: DC
  Administered 2015-03-10 – 2015-03-11 (×2): 1 [drp] via OPHTHALMIC
  Filled 2015-03-10: qty 5

## 2015-03-10 MED ORDER — EPHEDRINE SULFATE 50 MG/ML IJ SOLN
INTRAMUSCULAR | Status: AC
  Filled 2015-03-10: qty 1

## 2015-03-10 MED ORDER — HYOSCYAMINE SULFATE 0.125 MG SL SUBL
0.1250 mg | SUBLINGUAL_TABLET | SUBLINGUAL | Status: DC | PRN
  Filled 2015-03-10: qty 1

## 2015-03-10 MED ORDER — HYDROCODONE-ACETAMINOPHEN 5-325 MG PO TABS
1.0000 | ORAL_TABLET | ORAL | Status: DC | PRN
  Administered 2015-03-11: 1 via ORAL
  Filled 2015-03-10: qty 1

## 2015-03-10 MED ORDER — PHENYLEPHRINE HCL 10 MG/ML IJ SOLN
20.0000 mg | INTRAVENOUS | Status: DC | PRN
  Administered 2015-03-10: 15 ug/min via INTRAVENOUS

## 2015-03-10 MED ORDER — LIDOCAINE HCL (CARDIAC) 20 MG/ML IV SOLN
INTRAVENOUS | Status: AC
  Filled 2015-03-10: qty 5

## 2015-03-10 MED ORDER — FENTANYL CITRATE (PF) 250 MCG/5ML IJ SOLN
INTRAMUSCULAR | Status: AC
  Filled 2015-03-10: qty 5

## 2015-03-10 MED ORDER — SODIUM CHLORIDE 0.9 % IJ SOLN
INTRAMUSCULAR | Status: AC
  Filled 2015-03-10: qty 10

## 2015-03-10 MED ORDER — FLEET ENEMA 7-19 GM/118ML RE ENEM: 1.0000 | ENEMA | Freq: Once | RECTAL | Status: DC | PRN

## 2015-03-10 MED ORDER — FENTANYL CITRATE (PF) 100 MCG/2ML IJ SOLN
INTRAMUSCULAR | Status: DC | PRN
  Administered 2015-03-10: 50 ug via INTRAVENOUS

## 2015-03-10 MED ORDER — CIPROFLOXACIN IN D5W 400 MG/200ML IV SOLN
INTRAVENOUS | Status: AC
  Filled 2015-03-10: qty 200

## 2015-03-10 MED ORDER — KCL IN DEXTROSE-NACL 20-5-0.45 MEQ/L-%-% IV SOLN
INTRAVENOUS | Status: AC
  Filled 2015-03-10: qty 1000

## 2015-03-10 MED ORDER — TRAMADOL-ACETAMINOPHEN 37.5-325 MG PO TABS: 1.0000 | ORAL_TABLET | Freq: Four times a day (QID) | ORAL | Status: DC | PRN

## 2015-03-10 MED ORDER — SUGAMMADEX SODIUM 500 MG/5ML IV SOLN
INTRAVENOUS | Status: DC | PRN
  Administered 2015-03-10: 250 mg via INTRAVENOUS

## 2015-03-10 MED ORDER — PHENYLEPHRINE HCL 10 MG/ML IJ SOLN
INTRAMUSCULAR | Status: DC | PRN
  Administered 2015-03-10: 80 ug via INTRAVENOUS
  Administered 2015-03-10: 120 ug via INTRAVENOUS

## 2015-03-10 MED ORDER — ROCURONIUM BROMIDE 100 MG/10ML IV SOLN
INTRAVENOUS | Status: AC
  Filled 2015-03-10: qty 1

## 2015-03-10 MED ORDER — SODIUM CHLORIDE 0.9 % IR SOLN
Status: DC | PRN
  Administered 2015-03-10: 3000 mL
  Administered 2015-03-10: 1000 mL
  Administered 2015-03-10 (×2): 3000 mL

## 2015-03-10 SURGICAL SUPPLY — 27 items
BAG URINE DRAINAGE (UROLOGICAL SUPPLIES) ×2 IMPLANT
BAG URO CATCHER STRL LF (MISCELLANEOUS) ×3 IMPLANT
BASKET LASER NITINOL 1.9FR (BASKET) ×1 IMPLANT
BASKET ZERO TIP NITINOL 2.4FR (BASKET) IMPLANT
BSKT STON RTRVL 120 1.9FR (BASKET)
BSKT STON RTRVL ZERO TP 2.4FR (BASKET)
CATH FOLEY 2WAY SLVR  5CC 20FR (CATHETERS) ×2
CATH FOLEY 2WAY SLVR 5CC 20FR (CATHETERS) IMPLANT
CATH FOLEY 3WAY 30CC 22FR (CATHETERS) IMPLANT
CATH URET 5FR 28IN OPEN ENDED (CATHETERS) ×2 IMPLANT
GLOVE SURG SS PI 6.5 STRL IVOR (GLOVE) ×2 IMPLANT
GLOVE SURG SS PI 8.0 STRL IVOR (GLOVE) ×2 IMPLANT
GOWN STRL REUS W/TWL XL LVL3 (GOWN DISPOSABLE) ×5 IMPLANT
GUIDEWIRE ANG ZIPWIRE 038X150 (WIRE) IMPLANT
GUIDEWIRE STR DUAL SENSOR (WIRE) ×2 IMPLANT
HOLDER FOLEY CATH W/STRAP (MISCELLANEOUS) IMPLANT
KIT ASPIRATION TUBING (SET/KITS/TRAYS/PACK) ×1 IMPLANT
LOOP CUT BIPOLAR 24F LRG (ELECTROSURGICAL) ×2 IMPLANT
MANIFOLD NEPTUNE II (INSTRUMENTS) ×3 IMPLANT
PACK CYSTO (CUSTOM PROCEDURE TRAY) ×3 IMPLANT
PLUG CATH AND CAP STER (CATHETERS) ×2 IMPLANT
SUT ETHILON 3 0 PS 1 (SUTURE) IMPLANT
SYR 30ML LL (SYRINGE) IMPLANT
SYRINGE IRR TOOMEY STRL 70CC (SYRINGE) ×2 IMPLANT
TUBING CONNECTING 10 (TUBING) ×2 IMPLANT
TUBING CONNECTING 10' (TUBING) ×1
WIRE COONS/BENSON .038X145CM (WIRE) ×1 IMPLANT

## 2015-03-10 NOTE — Discharge Instructions (Signed)

## 2015-03-10 NOTE — Transfer of Care (Signed)
Immediate Anesthesia Transfer of Care Note  Patient: Gabriel Spencer  Procedure(s) Performed: Procedure(s): CYSTOSCOPY BILATERAL [place] (Bilateral) TRANSURETHRAL RESECTION OF BLADDER TUMOR (TURBT) INSTILLATION WITH MITOMYCIN C IN RECOVERY (Bilateral)  Patient Location: PACU  Anesthesia Type:General  Level of Consciousness: awake, alert , oriented and patient cooperative  Airway & Oxygen Therapy: Patient Spontanous Breathing and Patient connected to face mask oxygen  Post-op Assessment: Report given to RN, Post -op Vital signs reviewed and stable and Patient moving all extremities  Post vital signs: Reviewed and stable  Last Vitals:  Filed Vitals:   03/10/15 0534  BP: 149/68  Pulse: 86  Temp: 36.2 C  Resp: 18    Complications: No apparent anesthesia complications

## 2015-03-10 NOTE — Op Note (Signed)
NAME:  Gabriel Spencer, Gabriel Spencer NO.:  [id number]  MEDICAL RECORD NO.:  [id number]  LOCATION:  [facility]                         FACILITY:  [facility]  PHYSICIAN:  Marshall Cork. Jeffie Pollock, M.D.    DATE OF BIRTH:  Jan 03, 1932  DATE OF PROCEDURE:  03/10/2015 DATE OF DISCHARGE:                              OPERATIVE REPORT   PATIENT OF:  Marshall Cork. Jeffie Pollock, M.D.  PROCEDURES: 1. Cystoscopy with bilateral retrograde pyelograms and interpretation. 2. Transurethral resection of 2.8 cm bladder tumor. 3. Instillation of mitomycin-C in the PACU.  PREOPERATIVE DIAGNOSIS:  Left lateral wall bladder tumor.  POSTOPERATIVE DIAGNOSIS:  Left lateral wall bladder tumor.  SURGEON:  Marshall Cork. Jeffie Pollock, M.D.  ANESTHESIA:  General.  SPECIMEN:  Bladder tumor fragments.  DRAINS:  A 20-French Foley catheter.  BLOOD LOSS:  Minimal.  COMPLICATIONS:  None.  INDICATIONS:  Mr. Bagby is an 81 year old white male who was evaluated for hematuria and found to have an approximately 2.8 cm left lateral wall tumor on CT scan, and this was confirmed by office cystoscopy.  He returns today for further evaluation and treatment.  FINDINGS OF PROCEDURE:  The patient was taken to the operating room where general anesthetic was induced.  He was given Cipro.  He was placed in a lithotomy position and fitted with PAS hose.  His perineum and genitalia were prepped with Betadine solution, and he was draped in usual sterile fashion.  Cystoscopy was performed using the 23-French scope and the 30-degree lens.  Examination revealed a normal urethra.  The external sphincter was intact.  The prostatic urethra was approximately 3 cm in length with trilobar hyperplasia with some obstruction.  The middle lobe was small. Examination of bladder revealed moderate trabeculation with some cellules.  The ureteral orifices were in the normal anatomic position.  On the left lateral wall, approximately 2 cm from the ureteral orifice, there was a  papillary tumor approximately 2 x 3 cm in size.  This had a relatively low-grade appearance.  There was some necrotic debris in the bladder as well, potentially old clot.  No stones or other mucosal lesions were identified.  Once initial cystoscopy was performed, retrograde pyelography was performed using a 5-French open-end catheter and Omnipaque.  The left retrograde pyelogram revealed J-hooking of the distal ureter but an otherwise normal ureter and intrarenal collecting system without filling defects.  The right retrograde pyelogram demonstrated J-hooking of the distal ureter but an otherwise normal ureter and intrarenal collecting system.  Once retrograde pyelography was complete, the urethra was calibrated to 30-French with Leander Rams sounds, and the continuous flow resectoscope sheath was placed with the aid of the visual obturator.  This was then exchanged for an Rupert handle with a 30-degree lens and bipolar loop. Saline was used for the irrigant.  The patient had been intubated and given paralytic to avoid the obturator reflex.  The left lateral wall tumor was then resected down to the muscular fibers without difficulty or bladder wall perforation.  Once the tumor had been resected, the mucosa surrounding the tumor bed was fulgurated for hemostasis.  The chips were removed.  Final inspection revealed no residual tumor material  and no active bleeding. The ureteral orifices were intact.  The resectoscope was then removed, and a 20-French Foley catheter was placed.  The balloon was filled with 10 mL sterile fluid, and the catheter was irrigated with clear return and placed to straight drainage.  The patient was taken down from lithotomy position.  His anesthetic was reversed.  He was moved to recovery room in stable condition.  In the recovery room, his bladder was instilled with 40 mg of mitomycin-C.  The catheter was plugged.  This was left indwelling for one hour, and  then, the catheter was returned to drainage.  There were no complications during the procedure.     Marshall Cork. Jeffie Pollock, M.D.     JJW/MEDQ  D:  03/10/2015  T:  03/10/2015  Job:  [id number]

## 2015-03-10 NOTE — Interval H&P Note (Signed)
History and Physical Interval Note:  03/10/2015 7:19 AM  [name]  has presented today for surgery, with the diagnosis of BLADDER TUMOR  The various methods of treatment have been discussed with the patient and family. After consideration of risks, benefits and other options for treatment, the patient has consented to  Procedure(s): CYSTOSCOPY BILATERAL /RETROGRADE (Bilateral) TRANSURETHRAL RESECTION OF BLADDER TUMOR (TURBT) INSTILLATION WITH MITOMYCIN C (Bilateral) as a surgical intervention .  The patient's history has been reviewed, patient examined, no change in status, stable for surgery.  I have reviewed the patient's chart and labs.  Questions were answered to the patient's satisfaction.     Gabriel Spencer

## 2015-03-10 NOTE — Brief Op Note (Signed)
03/10/2015  8:13 AM  PATIENT:  Gabriel Spencer  81 y.o. male  PRE-OPERATIVE DIAGNOSIS:  BLADDER TUMOR 2.8cm  POST-OPERATIVE DIAGNOSIS:  BLADDER TUMOR 2.8cm PROCEDURE:  Procedure(s): CYSTOSCOPY BILATERAL /RETROGRADE (Bilateral) TRANSURETHRAL RESECTION OF BLADDER TUMOR (TURBT) INSTILLATION WITH MITOMYCIN C IN RECOVERY (Bilateral)  SURGEON:  Surgeon(s) and Role:    * Irine Seal, MD - Primary  PHYSICIAN ASSISTANT:   ASSISTANTS: none   ANESTHESIA:   general  EBL:  Total I/O In: -  Out: 25 [Blood:25]  BLOOD ADMINISTERED:none  DRAINS: Urinary Catheter (Foley)   LOCAL MEDICATIONS USED:  NONE  SPECIMEN:  Source of Specimen:  bladder tumor  DISPOSITION OF SPECIMEN:  PATHOLOGY  COUNTS:  YES  TOURNIQUET:  * No tourniquets in log *  DICTATION: .Other Dictation: Dictation Number [id number]  PLAN OF CARE: Admit for overnight observation  PATIENT DISPOSITION:  PACU - hemodynamically stable.   Delay start of Pharmacological VTE agent (>24hrs) due to surgical blood loss or risk of bleeding: yes

## 2015-03-10 NOTE — Anesthesia Procedure Notes (Signed)
Procedure Name: Intubation Date/Time: 03/10/2015 7:34 AM Performed by: Carleene Cooper A Pre-anesthesia Checklist: Patient identified, Emergency Drugs available, Suction available, Patient being monitored and Timeout performed Patient Re-evaluated:Patient Re-evaluated prior to inductionOxygen Delivery Method: Circle system utilized Preoxygenation: Pre-oxygenation with 100% oxygen Intubation Type: IV induction Ventilation: Mask ventilation without difficulty Laryngoscope Size: Mac and 4 Grade View: Grade I Tube size: 7.5 mm Number of attempts: 1 Airway Equipment and Method: Stylet Placement Confirmation: ETT inserted through vocal cords under direct vision,  positive ETCO2 and breath sounds checked- equal and bilateral Secured at: 22 cm Tube secured with: Tape Dental Injury: Teeth and Oropharynx as per pre-operative assessment

## 2015-03-10 NOTE — Anesthesia Postprocedure Evaluation (Signed)
Anesthesia Post Note  Patient: Gabriel Spencer  Procedure(s) Performed: Procedure(s) (LRB): CYSTOSCOPY BILATERAL /RETROGRADE (Bilateral) TRANSURETHRAL RESECTION OF BLADDER TUMOR (TURBT) INSTILLATION WITH MITOMYCIN C IN RECOVERY (Bilateral)  Patient location during evaluation: PACU Anesthesia Type: General Level of consciousness: awake and alert Pain management: pain level controlled Vital Signs Assessment: post-procedure vital signs reviewed and stable Respiratory status: spontaneous breathing, nonlabored ventilation, respiratory function stable and patient connected to nasal cannula oxygen Cardiovascular status: blood pressure returned to baseline and stable Postop Assessment: no signs of nausea or vomiting Anesthetic complications: no    Last Vitals:  Filed Vitals:   03/10/15 0915 03/10/15 0930  BP: 146/74 147/66  Pulse: 75 73  Temp: 36.3 C   Resp: 13 14    Last Pain:  Filed Vitals:   03/10/15 0934  PainSc: 0-No pain                 Cray Monnin L

## 2015-03-10 NOTE — Progress Notes (Signed)
Patient ID: Gabriel Spencer, male   DOB: September 06, 1931, 81 y.o.   MRN: [id number]  He is doing well post TURBT without complaints.  Urine is clearing.   BP 144/65 mmHg  Pulse 75  Temp(Src) 98.9 F (37.2 C) (Oral)  Resp 12  Ht 5\' 11"  (1.803 m)  Wt 107.502 kg (237 lb)  BMI 33.07 kg/m2  SpO2 95%  Plan to d/c foley and send home tomorrow.

## 2015-03-11 DIAGNOSIS — M109 Gout, unspecified: Secondary | ICD-10-CM | POA: Diagnosis not present

## 2015-03-11 DIAGNOSIS — M1990 Unspecified osteoarthritis, unspecified site: Secondary | ICD-10-CM | POA: Diagnosis not present

## 2015-03-11 DIAGNOSIS — N481 Balanitis: Secondary | ICD-10-CM | POA: Diagnosis not present

## 2015-03-11 DIAGNOSIS — C672 Malignant neoplasm of lateral wall of bladder: Secondary | ICD-10-CM | POA: Diagnosis not present

## 2015-03-11 DIAGNOSIS — H409 Unspecified glaucoma: Secondary | ICD-10-CM | POA: Diagnosis not present

## 2015-03-11 DIAGNOSIS — N471 Phimosis: Secondary | ICD-10-CM | POA: Diagnosis not present

## 2015-03-11 MED ORDER — URIBEL 118 MG PO CAPS: 1.0000 | ORAL_CAPSULE | Freq: Three times a day (TID) | ORAL | Status: DC | PRN

## 2015-03-11 NOTE — Progress Notes (Signed)
Voided- 162ml Bladder scan- Post-residual- 376ml

## 2015-03-11 NOTE — Discharge Summary (Signed)
Patient ID: Gabriel Spencer MRN: [id number] DOB/AGE: 1931-09-09 81 y.o.  Admit date: 03/10/2015 Discharge date: 03/11/2015  Primary Care Physician:  Doree Albee, MD  Discharge Diagnoses:   Present on Admission:  . Bladder cancer ([place])  Consults:  None     Discharge Medications:   Medication List    STOP taking these medications        ciprofloxacin 500 MG tablet  Commonly known as:  CIPRO      TAKE these medications        brimonidine 0.15 % ophthalmic solution  Commonly known as:  ALPHAGAN  Place 1 drop into both eyes 2 (two) times daily.     latanoprost 0.005 % ophthalmic solution  Commonly known as:  XALATAN  Place 1 drop into both eyes at bedtime.     phenazopyridine 200 MG tablet  Commonly known as:  PYRIDIUM  Take 1 tablet (200 mg total) by mouth 3 (three) times daily as needed for pain.     PRESERVISION AREDS PO  Take 1 tablet by mouth 2 (two) times daily.     traMADol-acetaminophen 37.5-325 MG tablet  Commonly known as:  ULTRACET  Take 1 tablet by mouth every 6 (six) hours as needed.     URIBEL 118 MG Caps  Take 1 capsule (118 mg total) by mouth every 8 (eight) hours as needed.         Significant Diagnostic Studies:  No results found.  Brief H and P: For complete details please refer to admission H and P, but in brief the patient was admitted by Dr. Jeffie Pollock for endoscopic management of a bladder tumor.  Hospital Course:  Active Problems:   Bladder cancer [facility])  Gabriel Spencer was admitted to the floor following his TURBT. He had an uncomplicated postoperative course. He voided after his Foley was removed. Following bladder scan showing no significant urinary residual, he was discharged home without a catheter.  Day of Discharge BP 115/67 mmHg  Pulse 89  Temp(Src) 98 F (36.7 C) (Oral)  Resp 18  Ht 5\' 11"  (1.803 m)  Wt 107.502 kg (237 lb)  BMI 33.07 kg/m2  SpO2 98%  No results found for this or any previous visit (from the past 24  hour(s)).  Physical Exam: General: Alert and awake oriented x3 not in any acute distress. HEENT: anicteric sclera, pupils reactive to light and accommodation CVS: S1-S2 clear no murmur rubs or gallops Chest: clear to auscultation bilaterally, no wheezing rales or rhonchi Abdomen: soft nontender, nondistended, normal bowel sounds, no organomegaly Extremities: no cyanosis, clubbing or edema noted bilaterally Neuro: Cranial nerves II-XII intact, no focal neurological deficits  Disposition:  Home  Diet:  No restrictions  Activity:  Discussed with patient   Disposition and Follow-up:    He will follow-up with Dr. Jeffie Pollock  TESTS THAT NEED FOLLOW-UP  Pathology review  DISCHARGE FOLLOW-UP     Follow-up Information    Follow up with Malka So, MD On 03/25/2015.   Specialty:  Urology   Why:  [id number]   Contact information:   [address] [place] [address] [phone]       Time spent on Discharge:  10 minutes  Signed: Jorja Loa 03/11/2015, 8:07 AM

## 2015-03-11 NOTE — Progress Notes (Signed)
Voided- 268ml Bladder Scan-Post residual- 48mls -Will Discharge patient home per order.

## 2015-03-25 ENCOUNTER — Ambulatory Visit (INDEPENDENT_AMBULATORY_CARE_PROVIDER_SITE_OTHER): Payer: Medicare Other | Admitting: Urology

## 2015-03-25 DIAGNOSIS — C672 Malignant neoplasm of lateral wall of bladder: Secondary | ICD-10-CM | POA: Diagnosis not present

## 2015-03-25 DIAGNOSIS — N390 Urinary tract infection, site not specified: Secondary | ICD-10-CM | POA: Diagnosis not present

## 2015-04-19 DIAGNOSIS — E559 Vitamin D deficiency, unspecified: Secondary | ICD-10-CM | POA: Diagnosis not present

## 2015-04-19 DIAGNOSIS — C670 Malignant neoplasm of trigone of bladder: Secondary | ICD-10-CM | POA: Diagnosis not present

## 2015-04-19 DIAGNOSIS — M1A00X0 Idiopathic chronic gout, unspecified site, without tophus (tophi): Secondary | ICD-10-CM | POA: Diagnosis not present

## 2015-04-19 DIAGNOSIS — R5383 Other fatigue: Secondary | ICD-10-CM | POA: Diagnosis not present

## 2015-04-19 DIAGNOSIS — Z0389 Encounter for observation for other suspected diseases and conditions ruled out: Secondary | ICD-10-CM | POA: Diagnosis not present

## 2015-04-21 DIAGNOSIS — R945 Abnormal results of liver function studies: Secondary | ICD-10-CM | POA: Diagnosis not present

## 2015-05-04 DIAGNOSIS — M1A00X0 Idiopathic chronic gout, unspecified site, without tophus (tophi): Secondary | ICD-10-CM | POA: Diagnosis not present

## 2015-05-04 DIAGNOSIS — C670 Malignant neoplasm of trigone of bladder: Secondary | ICD-10-CM | POA: Diagnosis not present

## 2015-05-04 DIAGNOSIS — Z8669 Personal history of other diseases of the nervous system and sense organs: Secondary | ICD-10-CM | POA: Diagnosis not present

## 2015-05-04 DIAGNOSIS — R945 Abnormal results of liver function studies: Secondary | ICD-10-CM | POA: Diagnosis not present

## 2015-05-11 DIAGNOSIS — E559 Vitamin D deficiency, unspecified: Secondary | ICD-10-CM | POA: Diagnosis not present

## 2015-05-11 DIAGNOSIS — Z23 Encounter for immunization: Secondary | ICD-10-CM | POA: Diagnosis not present

## 2015-05-11 DIAGNOSIS — E039 Hypothyroidism, unspecified: Secondary | ICD-10-CM | POA: Diagnosis not present

## 2015-05-11 DIAGNOSIS — R945 Abnormal results of liver function studies: Secondary | ICD-10-CM | POA: Diagnosis not present

## 2015-05-11 DIAGNOSIS — E669 Obesity, unspecified: Secondary | ICD-10-CM | POA: Diagnosis not present

## 2015-06-24 ENCOUNTER — Ambulatory Visit (INDEPENDENT_AMBULATORY_CARE_PROVIDER_SITE_OTHER): Payer: Medicare Other | Admitting: Urology

## 2015-06-24 DIAGNOSIS — N390 Urinary tract infection, site not specified: Secondary | ICD-10-CM | POA: Diagnosis not present

## 2015-06-24 DIAGNOSIS — C672 Malignant neoplasm of lateral wall of bladder: Secondary | ICD-10-CM | POA: Diagnosis not present

## 2015-07-18 DIAGNOSIS — R945 Abnormal results of liver function studies: Secondary | ICD-10-CM | POA: Diagnosis not present

## 2015-07-18 DIAGNOSIS — E039 Hypothyroidism, unspecified: Secondary | ICD-10-CM | POA: Diagnosis not present

## 2015-08-01 ENCOUNTER — Other Ambulatory Visit (HOSPITAL_COMMUNITY): Payer: Self-pay | Admitting: Internal Medicine

## 2015-08-01 ENCOUNTER — Ambulatory Visit (HOSPITAL_COMMUNITY)
Admission: RE | Admit: 2015-08-01 | Discharge: 2015-08-01 | Disposition: A | Discharge status: Home / Self Care | Payer: Medicare Other | Source: Ambulatory Visit | Attending: Internal Medicine | Admitting: Internal Medicine

## 2015-08-01 DIAGNOSIS — G319 Degenerative disease of nervous system, unspecified: Secondary | ICD-10-CM | POA: Insufficient documentation

## 2015-08-01 DIAGNOSIS — H532 Diplopia: Secondary | ICD-10-CM | POA: Diagnosis not present

## 2015-08-01 DIAGNOSIS — R600 Localized edema: Secondary | ICD-10-CM | POA: Insufficient documentation

## 2015-08-01 DIAGNOSIS — R42 Dizziness and giddiness: Secondary | ICD-10-CM | POA: Diagnosis not present

## 2015-08-01 DIAGNOSIS — H4901 Third [oculomotor] nerve palsy, right eye: Secondary | ICD-10-CM | POA: Diagnosis not present

## 2015-08-02 ENCOUNTER — Ambulatory Visit (HOSPITAL_COMMUNITY): Payer: Medicare Other

## 2015-08-15 DIAGNOSIS — H6121 Impacted cerumen, right ear: Secondary | ICD-10-CM | POA: Diagnosis not present

## 2015-09-23 ENCOUNTER — Ambulatory Visit (INDEPENDENT_AMBULATORY_CARE_PROVIDER_SITE_OTHER): Payer: Medicare Other | Admitting: Urology

## 2015-09-23 ENCOUNTER — Other Ambulatory Visit (HOSPITAL_COMMUNITY)
Admission: RE | Admit: 2015-09-23 | Discharge: 2015-09-23 | Disposition: A | Discharge status: Home / Self Care | Payer: Medicare Other | Source: Other Acute Inpatient Hospital | Attending: Urology | Admitting: Urology

## 2015-09-23 DIAGNOSIS — N390 Urinary tract infection, site not specified: Secondary | ICD-10-CM | POA: Diagnosis not present

## 2015-09-23 DIAGNOSIS — Z8551 Personal history of malignant neoplasm of bladder: Secondary | ICD-10-CM

## 2015-09-26 LAB — URINE CULTURE: Culture: >=100000 — AB

## 2015-10-21 ENCOUNTER — Other Ambulatory Visit (HOSPITAL_COMMUNITY)
Admission: RE | Admit: 2015-10-21 | Discharge: 2015-10-21 | Disposition: A | Discharge status: Home / Self Care | Payer: Medicare Other | Source: Other Acute Inpatient Hospital | Attending: Urology | Admitting: Urology

## 2015-10-21 DIAGNOSIS — N390 Urinary tract infection, site not specified: Secondary | ICD-10-CM | POA: Insufficient documentation

## 2015-10-21 LAB — URINALYSIS, ROUTINE W REFLEX MICROSCOPIC
BILIRUBIN URINE: NEGATIVE
Glucose, UA: NEGATIVE mg/dL
Hgb urine dipstick: NEGATIVE
KETONES UR: NEGATIVE mg/dL
NITRITE: POSITIVE — AB
Protein, ur: NEGATIVE mg/dL
SPECIFIC GRAVITY, URINE: 1.015 (ref 1.005–1.030)
pH: 6 (ref 5.0–8.0)

## 2015-10-21 LAB — URINE MICROSCOPIC-ADD ON
RBC / HPF: NONE SEEN RBC/hpf (ref 0–5)
SQUAMOUS EPITHELIAL / LPF: NONE SEEN

## 2015-10-23 LAB — URINE CULTURE: Culture: >=100000 — AB

## 2015-10-28 ENCOUNTER — Ambulatory Visit (INDEPENDENT_AMBULATORY_CARE_PROVIDER_SITE_OTHER): Payer: Medicare Other | Admitting: Urology

## 2015-10-28 DIAGNOSIS — Z8551 Personal history of malignant neoplasm of bladder: Secondary | ICD-10-CM

## 2015-10-28 DIAGNOSIS — N390 Urinary tract infection, site not specified: Secondary | ICD-10-CM | POA: Diagnosis not present

## 2015-10-31 DIAGNOSIS — E039 Hypothyroidism, unspecified: Secondary | ICD-10-CM | POA: Diagnosis not present

## 2015-10-31 DIAGNOSIS — R21 Rash and other nonspecific skin eruption: Secondary | ICD-10-CM | POA: Diagnosis not present

## 2015-10-31 DIAGNOSIS — R945 Abnormal results of liver function studies: Secondary | ICD-10-CM | POA: Diagnosis not present

## 2015-11-10 DIAGNOSIS — H401193 Primary open-angle glaucoma, unspecified eye, severe stage: Secondary | ICD-10-CM | POA: Diagnosis not present

## 2015-12-26 DIAGNOSIS — E039 Hypothyroidism, unspecified: Secondary | ICD-10-CM | POA: Diagnosis not present

## 2015-12-26 DIAGNOSIS — Z23 Encounter for immunization: Secondary | ICD-10-CM | POA: Diagnosis not present

## 2016-04-09 DIAGNOSIS — Z8669 Personal history of other diseases of the nervous system and sense organs: Secondary | ICD-10-CM | POA: Diagnosis not present

## 2016-04-09 DIAGNOSIS — R5383 Other fatigue: Secondary | ICD-10-CM | POA: Diagnosis not present

## 2016-04-09 DIAGNOSIS — Z0389 Encounter for observation for other suspected diseases and conditions ruled out: Secondary | ICD-10-CM | POA: Diagnosis not present

## 2016-04-09 DIAGNOSIS — E039 Hypothyroidism, unspecified: Secondary | ICD-10-CM | POA: Diagnosis not present

## 2016-04-09 DIAGNOSIS — M1A00X0 Idiopathic chronic gout, unspecified site, without tophus (tophi): Secondary | ICD-10-CM | POA: Diagnosis not present

## 2016-04-09 DIAGNOSIS — E559 Vitamin D deficiency, unspecified: Secondary | ICD-10-CM | POA: Diagnosis not present

## 2016-05-18 ENCOUNTER — Ambulatory Visit (INDEPENDENT_AMBULATORY_CARE_PROVIDER_SITE_OTHER): Payer: Medicare Other | Admitting: Urology

## 2016-05-18 ENCOUNTER — Other Ambulatory Visit (HOSPITAL_COMMUNITY)
Admission: RE | Admit: 2016-05-18 | Discharge: 2016-05-18 | Disposition: A | Discharge status: Home / Self Care | Payer: Medicare Other | Source: Other Acute Inpatient Hospital | Attending: Urology | Admitting: Urology

## 2016-05-18 DIAGNOSIS — N481 Balanitis: Secondary | ICD-10-CM

## 2016-05-18 DIAGNOSIS — Z8551 Personal history of malignant neoplasm of bladder: Secondary | ICD-10-CM

## 2016-05-18 DIAGNOSIS — N390 Urinary tract infection, site not specified: Secondary | ICD-10-CM | POA: Insufficient documentation

## 2016-05-18 LAB — URINALYSIS, COMPLETE (UACMP) WITH MICROSCOPIC
Bilirubin Urine: NEGATIVE
Glucose, UA: NEGATIVE mg/dL
HGB URINE DIPSTICK: NEGATIVE
Ketones, ur: NEGATIVE mg/dL
Leukocytes, UA: NEGATIVE
Nitrite: POSITIVE — AB
Protein, ur: NEGATIVE mg/dL
Specific Gravity, Urine: 1.015 (ref 1.005–1.030)
pH: 5 (ref 5.0–8.0)

## 2016-05-20 LAB — URINE CULTURE: Culture: >=100000 — AB

## 2016-07-18 DIAGNOSIS — H6123 Impacted cerumen, bilateral: Secondary | ICD-10-CM | POA: Diagnosis not present

## 2016-07-27 ENCOUNTER — Other Ambulatory Visit (HOSPITAL_COMMUNITY)
Admission: RE | Admit: 2016-07-27 | Discharge: 2016-07-27 | Disposition: A | Discharge status: Home / Self Care | Payer: Medicare Other | Source: Other Acute Inpatient Hospital | Attending: Urology | Admitting: Urology

## 2016-07-27 ENCOUNTER — Ambulatory Visit (INDEPENDENT_AMBULATORY_CARE_PROVIDER_SITE_OTHER): Payer: Medicare Other | Admitting: Urology

## 2016-07-27 DIAGNOSIS — N390 Urinary tract infection, site not specified: Secondary | ICD-10-CM

## 2016-07-28 LAB — URINE CULTURE

## 2016-08-03 ENCOUNTER — Ambulatory Visit (INDEPENDENT_AMBULATORY_CARE_PROVIDER_SITE_OTHER): Payer: Medicare Other | Admitting: Urology

## 2016-08-03 DIAGNOSIS — C672 Malignant neoplasm of lateral wall of bladder: Secondary | ICD-10-CM

## 2016-12-28 ENCOUNTER — Ambulatory Visit (INDEPENDENT_AMBULATORY_CARE_PROVIDER_SITE_OTHER): Payer: Medicare Other | Admitting: Urology

## 2016-12-28 ENCOUNTER — Other Ambulatory Visit (HOSPITAL_COMMUNITY)
Admission: RE | Admit: 2016-12-28 | Discharge: 2016-12-28 | Disposition: A | Discharge status: Home / Self Care | Payer: Medicare Other | Source: Other Acute Inpatient Hospital | Attending: Urology | Admitting: Urology

## 2016-12-28 DIAGNOSIS — N481 Balanitis: Secondary | ICD-10-CM | POA: Diagnosis not present

## 2016-12-28 DIAGNOSIS — N390 Urinary tract infection, site not specified: Secondary | ICD-10-CM | POA: Diagnosis not present

## 2016-12-28 DIAGNOSIS — Z8551 Personal history of malignant neoplasm of bladder: Secondary | ICD-10-CM

## 2016-12-28 LAB — URINALYSIS, COMPLETE (UACMP) WITH MICROSCOPIC
Bilirubin Urine: NEGATIVE
GLUCOSE, UA: NEGATIVE mg/dL
Hgb urine dipstick: NEGATIVE
KETONES UR: NEGATIVE mg/dL
Nitrite: POSITIVE — AB
PH: 5 (ref 5.0–8.0)
Protein, ur: NEGATIVE mg/dL
SPECIFIC GRAVITY, URINE: 1.014 (ref 1.005–1.030)

## 2017-01-01 LAB — URINE CULTURE: Culture: >=100000 — AB

## 2017-01-05 ENCOUNTER — Encounter (HOSPITAL_COMMUNITY): Payer: Self-pay | Admitting: Emergency Medicine

## 2017-01-05 ENCOUNTER — Emergency Department (HOSPITAL_COMMUNITY)
Admission: EM | Admit: 2017-01-05 | Discharge: 2017-01-05 | Disposition: A | Discharge status: Home / Self Care | Payer: Medicare Other | Attending: Emergency Medicine | Admitting: Emergency Medicine

## 2017-01-05 ENCOUNTER — Emergency Department (HOSPITAL_COMMUNITY): Payer: Medicare Other

## 2017-01-05 DIAGNOSIS — Z79899 Other long term (current) drug therapy: Secondary | ICD-10-CM | POA: Diagnosis not present

## 2017-01-05 DIAGNOSIS — Z87891 Personal history of nicotine dependence: Secondary | ICD-10-CM | POA: Insufficient documentation

## 2017-01-05 DIAGNOSIS — R05 Cough: Secondary | ICD-10-CM | POA: Diagnosis present

## 2017-01-05 DIAGNOSIS — J40 Bronchitis, not specified as acute or chronic: Secondary | ICD-10-CM | POA: Insufficient documentation

## 2017-01-05 HISTORY — DX: Disorder of thyroid, unspecified: E07.9

## 2017-01-05 HISTORY — DX: Unspecified macular degeneration: H35.30

## 2017-01-05 LAB — CBC WITH DIFFERENTIAL/PLATELET
BASOS ABS: 0 10*3/uL (ref 0.0–0.1)
Basophils Relative: 0 %
Eosinophils Absolute: 0.3 10*3/uL (ref 0.0–0.7)
Eosinophils Relative: 3 %
HCT: 40.3 % (ref 39.0–52.0)
Hemoglobin: 13.5 g/dL (ref 13.0–17.0)
LYMPHS PCT: 8 %
Lymphs Abs: 1 10*3/uL (ref 0.7–4.0)
MCH: 31.4 pg (ref 26.0–34.0)
MCHC: 33.5 g/dL (ref 30.0–36.0)
MCV: 93.7 fL (ref 78.0–100.0)
MONO ABS: 1.6 10*3/uL — AB (ref 0.1–1.0)
Monocytes Relative: 12 %
Neutro Abs: 10.2 10*3/uL — ABNORMAL HIGH (ref 1.7–7.7)
Neutrophils Relative %: 77 %
Platelets: 135 10*3/uL — ABNORMAL LOW (ref 150–400)
RBC: 4.3 MIL/uL (ref 4.22–5.81)
RDW: 13.8 % (ref 11.5–15.5)
WBC: 13.1 10*3/uL — AB (ref 4.0–10.5)

## 2017-01-05 LAB — URINALYSIS, ROUTINE W REFLEX MICROSCOPIC
BACTERIA UA: NONE SEEN
BILIRUBIN URINE: NEGATIVE
Glucose, UA: NEGATIVE mg/dL
Hgb urine dipstick: NEGATIVE
Ketones, ur: NEGATIVE mg/dL
NITRITE: NEGATIVE
Protein, ur: NEGATIVE mg/dL
Specific Gravity, Urine: 1.015 (ref 1.005–1.030)
pH: 5 (ref 5.0–8.0)

## 2017-01-05 LAB — BASIC METABOLIC PANEL
ANION GAP: 10 (ref 5–15)
BUN: 22 mg/dL — ABNORMAL HIGH (ref 6–20)
CO2: 25 mmol/L (ref 22–32)
Calcium: 9 mg/dL (ref 8.9–10.3)
Chloride: 99 mmol/L — ABNORMAL LOW (ref 101–111)
Creatinine, Ser: 1.08 mg/dL (ref 0.61–1.24)
GFR calc Af Amer: >60 mL/min (ref >60)
GFR calc non Af Amer: >60 mL/min (ref >60)
GLUCOSE: 142 mg/dL — AB (ref 65–99)
POTASSIUM: 4.7 mmol/L (ref 3.5–5.1)
Sodium: 134 mmol/L — ABNORMAL LOW (ref 135–145)

## 2017-01-05 LAB — I-STAT CG4 LACTIC ACID, ED: Lactic Acid, Venous: 1.58 mmol/L (ref 0.5–1.9)

## 2017-01-05 MED ORDER — DOXYCYCLINE HYCLATE 100 MG PO CAPS: 100.0000 mg | ORAL_CAPSULE | Freq: Two times a day (BID) | ORAL | 0 refills | Status: DC

## 2017-01-05 MED ORDER — ALBUTEROL SULFATE HFA 108 (90 BASE) MCG/ACT IN AERS: 2.0000 | INHALATION_SPRAY | RESPIRATORY_TRACT | 3 refills | Status: DC | PRN

## 2017-01-05 MED ORDER — DOXYCYCLINE HYCLATE 100 MG PO TABS
100.0000 mg | ORAL_TABLET | Freq: Once | ORAL | Status: AC
  Administered 2017-01-05: 100 mg via ORAL
  Filled 2017-01-05: qty 1

## 2017-01-05 MED ORDER — BENZONATATE 100 MG PO CAPS: 100.0000 mg | ORAL_CAPSULE | Freq: Three times a day (TID) | ORAL | 0 refills | Status: DC

## 2017-01-05 MED ORDER — LACOSAMIDE 50 MG PO TABS: 50.0000 mg | ORAL_TABLET | ORAL | Status: DC

## 2017-01-05 NOTE — Discharge Instructions (Signed)
Your xray showed no obvious pneumonia It did show some nodules  You MUST have your doctor order a CT scan of your chest I have given you the xray report - share this with your doctor  ER for worsening symtpoms.  Doxycycline twice daily for 7 days Tessalon as needed for coughing

## 2017-01-05 NOTE — ED Triage Notes (Signed)
Patient c/o  cough with fever x3 days. Patient states occasional thick clear sputum. Patient unsure of highest fever, patient states "haven't been taking it just felt warm.". Patient reports using Mucinex, Robitussin, and Advil. Denies any chills

## 2017-01-05 NOTE — ED Provider Notes (Signed)
[facility] DEPT Provider Note   CSN: [id number] Arrival date & time: 01/05/17  1031     History   Chief Complaint Chief Complaint  Patient presents with  . Cough    HPI Gabriel Spencer is a 83 y.o. male.  HPI  The patient is an 83 year old male, history of arthritis, history of bladder cancer, history of thyroid disease. He has never had any specific lung diseases and is never had any specific heart diseases. Over the last 3 days the patient has had a progressive illness which started mild with a cough and has progressed with fevers, coughing, occasional productive cough, no wheezing, no swelling of the legs, they have not measured and objective fever but he does feel fevers and chills. His significant other who is also part of the history states that he has been coughing some at night which is very unusual for him. He notes that when he walks he becomes a little more short of breath and coughs more as well. He does not have any chest pain with this. He does not have any known sick exposures.  Past Medical History:  Diagnosis Date  . Arthritis   . Glaucoma   . Macular degeneration   . Thyroid disease     Patient Active Problem List   Diagnosis Date Noted  . Bladder cancer ([place]) 03/10/2015    Past Surgical History:  Procedure Laterality Date  . BLADDER SURGERY    . CATARACT EXTRACTION, BILATERAL    . CHOLECYSTECTOMY  11/30/2011   Procedure: LAPAROSCOPIC CHOLECYSTECTOMY;  Surgeon: Jamesetta So, MD;  Location: [facility];  Service: General;  Laterality: N/A;  . CYSTOSCOPY/RETROGRADE/URETEROSCOPY Bilateral 03/10/2015   Procedure: Consuela Mimes BILATERAL Concha Se;  Surgeon: Irine Seal, MD;  Location: [facility];  Service: Urology;  Laterality: Bilateral;  . LYMPH NODE BIOPSY  1975   right  . RETINAL DETACHMENT SURGERY     right eye  . TRANSURETHRAL RESECTION OF BLADDER TUMOR Bilateral 03/10/2015   Procedure: TRANSURETHRAL RESECTION OF BLADDER TUMOR (TURBT) INSTILLATION WITH  MITOMYCIN C IN RECOVERY;  Surgeon: Irine Seal, MD;  Location: [facility];  Service: Urology;  Laterality: Bilateral;  . TYMPANOPLASTY     right       Home Medications    Prior to Admission medications   Medication Sig Start Date End Date Taking? Authorizing Provider  brimonidine (ALPHAGAN) 0.15 % ophthalmic solution Place 1 drop into both eyes 2 (two) times daily.   Yes [provider]  Ergocalciferol (VITAMIN D2) 400 units TABS Take 1 tablet by mouth daily.   Yes [provider]  latanoprost (XALATAN) 0.005 % ophthalmic solution Place 1 drop into both eyes at bedtime.   Yes [provider]  levothyroxine (SYNTHROID, LEVOTHROID) 75 MCG tablet Take 75 mcg by mouth daily before breakfast.   Yes [provider]  Multiple Vitamins-Minerals (PRESERVISION AREDS PO) Take 1 tablet by mouth 2 (two) times daily.   Yes [provider]  Omega-3 Fatty Acids (FISH OIL) 500 MG CAPS Take 1 capsule by mouth daily.   Yes [provider]  albuterol (PROVENTIL HFA;VENTOLIN HFA) 108 (90 Base) MCG/ACT inhaler Inhale 2 puffs into the lungs every 4 (four) hours as needed for wheezing or shortness of breath. 01/05/17   Noemi Chapel, MD  benzonatate (TESSALON) 100 MG capsule Take 1 capsule (100 mg total) by mouth every 8 (eight) hours. 01/05/17   Noemi Chapel, MD  doxycycline (VIBRAMYCIN) 100 MG capsule Take 1 capsule (100 mg total) by  mouth 2 (two) times daily. 01/05/17   Noemi Chapel, MD  Meth-Hyo-M Barnett Hatter Phos-Ph Sal (URIBEL) 118 MG CAPS Take 1 capsule (118 mg total) by mouth every 8 (eight) hours as needed. Patient not taking: Reported on 01/05/2017 03/11/15   Franchot Gallo, MD  phenazopyridine (PYRIDIUM) 200 MG tablet Take 1 tablet (200 mg total) by mouth 3 (three) times daily as needed for pain. Patient not taking: Reported on 01/05/2017 03/10/15   Irine Seal, MD  traMADol-acetaminophen (ULTRACET) 37.5-325 MG tablet Take 1 tablet by mouth every 6 (six)  hours as needed. Patient not taking: Reported on 01/05/2017 03/10/15   Irine Seal, MD    Family History History reviewed. No pertinent family history.  Social History Social History  Substance Use Topics  . Smoking status: Former Smoker    Quit date: 03/27/1983  . Smokeless tobacco: Never Used  . Alcohol use No     Allergies   Keflex [cephalexin]   Review of Systems Review of Systems  All other systems reviewed and are negative.    Physical Exam Updated Vital Signs BP (!) 152/70 (BP Location: Right Arm)   Pulse 100   Temp 98.3 F (36.8 C) (Oral)   Resp 20   Ht 5\' 11"  (1.803 m)   Wt 105.7 kg (233 lb)   SpO2 96%   BMI 32.50 kg/m   Physical Exam  Constitutional: He appears well-developed and well-nourished. No distress.  HENT:  Head: Normocephalic and atraumatic.  Mouth/Throat: Oropharynx is clear and moist. No oropharyngeal exudate.  Eyes: Pupils are equal, round, and reactive to light. Conjunctivae and EOM are normal. Right eye exhibits no discharge. Left eye exhibits no discharge. No scleral icterus.  Neck: Normal range of motion. Neck supple. No JVD present. No thyromegaly present.  Cardiovascular: Normal rate, regular rhythm, normal heart sounds and intact distal pulses.  Exam reveals no gallop and no friction rub.   No murmur heard. Heart rate of 100, no murmurs, normal pulses, no JVD or peripheral edema  Pulmonary/Chest: Effort normal. No respiratory distress. He has no wheezes. He has rales ( Subtle rales at the bases bilaterally which clear with deep breathing and coughing).  Abdominal: Soft. Bowel sounds are normal. He exhibits no distension and no mass. There is no tenderness.  Musculoskeletal: Normal range of motion. He exhibits no edema or tenderness.  Lymphadenopathy:    He has no cervical adenopathy.  Neurological: He is alert. Coordination normal.  Skin: Skin is warm and dry. No rash noted. No erythema.  Psychiatric: He has a normal mood and  affect. His behavior is normal.  Nursing note and vitals reviewed.    ED Treatments / Results  Labs (all labs ordered are listed, but only abnormal results are displayed) Labs Reviewed  CBC WITH DIFFERENTIAL/PLATELET - Abnormal; Notable for the following:       Result Value   WBC 13.1 (*)    Platelets 135 (*)    Neutro Abs 10.2 (*)    Monocytes Absolute 1.6 (*)    All other components within normal limits  BASIC METABOLIC PANEL - Abnormal; Notable for the following:    Sodium 134 (*)    Chloride 99 (*)    Glucose, Bld 142 (*)    BUN 22 (*)    All other components within normal limits  URINALYSIS, ROUTINE W REFLEX MICROSCOPIC - Abnormal; Notable for the following:    Leukocytes, UA TRACE (*)    Squamous Epithelial / LPF 0-5 (*)  All other components within normal limits  URINE CULTURE  I-STAT CG4 LACTIC ACID, ED     Radiology Dg Chest 2 View  Result Date: 01/05/2017 CLINICAL DATA:  Fever, cough. EXAM: CHEST  2 VIEW COMPARISON:  Radiographs of November 29, 2011. FINDINGS: The heart size and mediastinal contours are within normal limits. No pneumothorax or pleural effusion is noted. Mild bibasilar subsegmental atelectasis or inflammation is noted. Right upper lobe nodular opacity is noted. The visualized skeletal structures are unremarkable. IMPRESSION: Mild bibasilar interstitial densities are noted concerning for subsegmental atelectasis or inflammation. Interval development of nodular densities seen in right upper lobe; CT scan of the chest is recommended to rule out pulmonary nodule or mass. Electronically Signed   By: Marijo Conception, M.D.   On: 01/05/2017 12:10    Procedures Procedures (including critical care time)  Medications Ordered in ED Medications  doxycycline (VIBRA-TABS) tablet 100 mg (not administered)     Initial Impression / Assessment and Plan / ED Course  I have reviewed the triage vital signs and the nursing notes.  Pertinent labs & imaging  results that were available during my care of the patient were reviewed by me and considered in my medical decision making (see chart for details).    The patient has coughing, subjective fevers, we'll obtain a chest x-ray and labs. He does have some subtle rales but they do clear with deep breathing suggesting that this is either atelectasis or pneumonia.  Xray shows nodules Atelectasis UA clean, WBC at 13,100 Doxycycline Tessalon as needed Stable for f/u Pt given copy of xray results and indications for CT outpt   Final Clinical Impressions(s) / ED Diagnoses   Final diagnoses:  Bronchitis    New Prescriptions New Prescriptions   ALBUTEROL (PROVENTIL HFA;VENTOLIN HFA) 108 (90 BASE) MCG/ACT INHALER    Inhale 2 puffs into the lungs every 4 (four) hours as needed for wheezing or shortness of breath.   BENZONATATE (TESSALON) 100 MG CAPSULE    Take 1 capsule (100 mg total) by mouth every 8 (eight) hours.   DOXYCYCLINE (VIBRAMYCIN) 100 MG CAPSULE    Take 1 capsule (100 mg total) by mouth 2 (two) times daily.     Noemi Chapel, MD 01/05/17 [phone]

## 2017-01-08 LAB — URINE CULTURE: Culture: 50000 — AB

## 2017-01-09 ENCOUNTER — Telehealth: Payer: Self-pay | Admitting: Emergency Medicine

## 2017-01-09 NOTE — Telephone Encounter (Signed)
Post ED Visit - Positive Culture Follow-up  Culture report reviewed by antimicrobial stewardship pharmacist:  []  Elenor Quinones, Pharm.D. []  Heide Guile, Pharm.D., BCPS AQ-ID []  Parks Neptune, Pharm.D., BCPS []  Alycia Rossetti, Pharm.D., BCPS []  Strang, Pharm.D., BCPS, AAHIVP []  Legrand Como, Pharm.D., BCPS, AAHIVP []  Salome Arnt, PharmD, BCPS []  Dimitri Ped, PharmD, BCPS []  Vincenza Hews, PharmD, BCPS  Positive urine culture Treated with doxycycline, organism sensitive to the same and no further patient follow-up is required at this time.  Hazle Nordmann 01/09/2017, 12:09 PM

## 2017-01-11 ENCOUNTER — Ambulatory Visit (INDEPENDENT_AMBULATORY_CARE_PROVIDER_SITE_OTHER): Payer: Medicare Other | Admitting: Urology

## 2017-01-11 DIAGNOSIS — Z8551 Personal history of malignant neoplasm of bladder: Secondary | ICD-10-CM | POA: Diagnosis not present

## 2017-01-11 DIAGNOSIS — N390 Urinary tract infection, site not specified: Secondary | ICD-10-CM

## 2017-02-18 DIAGNOSIS — H401113 Primary open-angle glaucoma, right eye, severe stage: Secondary | ICD-10-CM | POA: Diagnosis not present

## 2017-09-17 IMAGING — CT CT RENAL STONE PROTOCOL
2 of 4 series · 17 of 46 positions shown, 19 images · non-contrast
Comparison: CT scan of December 09, 2011.

CLINICAL DATA: Gross hematuria.

EXAM:
CT ABDOMEN AND PELVIS WITHOUT CONTRAST
TECHNIQUE: Multidetector CT imaging of the abdomen and pelvis was performed
following the standard protocol without IV contrast.

[Series 2: standard/full over (age)lbs 5.0 · axial · 0.93mm/px · z∈[-473,-43]mm · 14 of 94 slices shown, 16 images]
[im 4/94  soft-tissue]
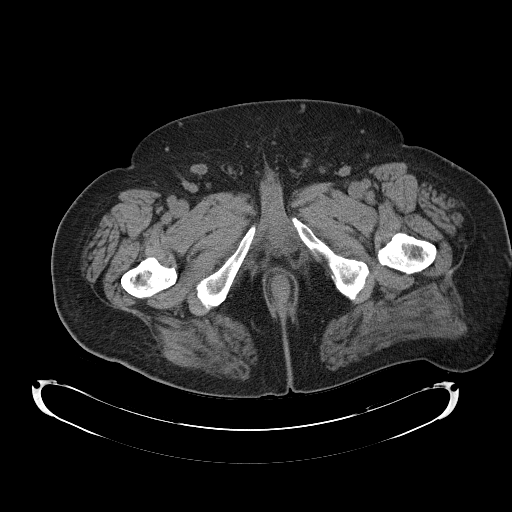
[im 4/94  bone]
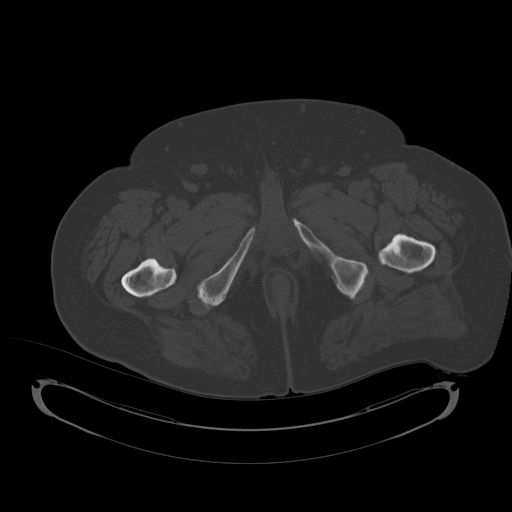
[im 12/94  soft-tissue]
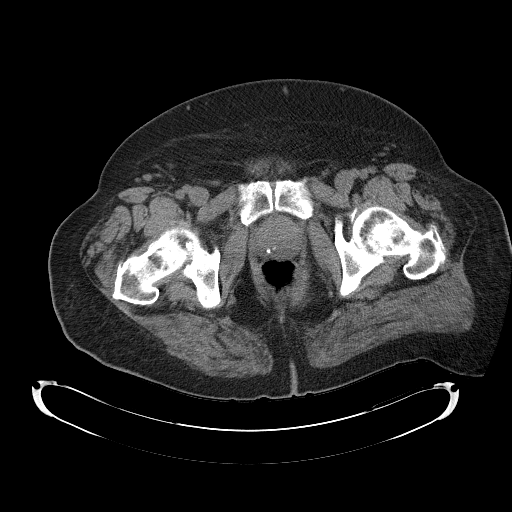
[im 20/94  soft-tissue]
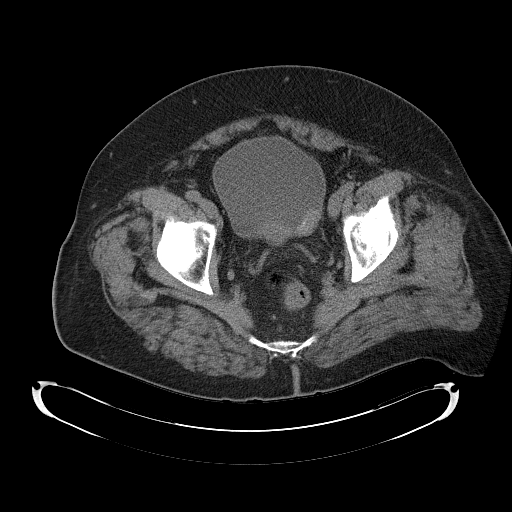
[im 24/94  soft-tissue]
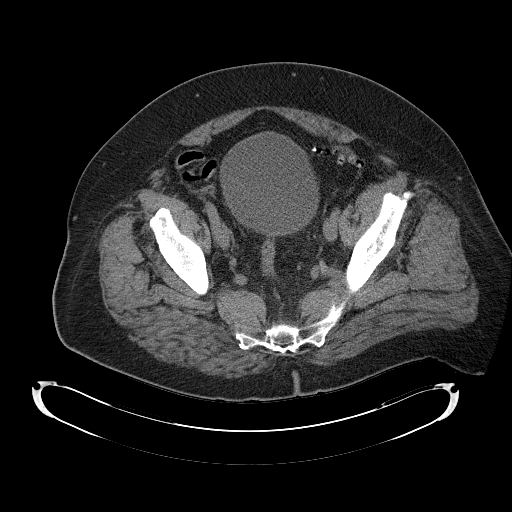
[im 32/94  soft-tissue]
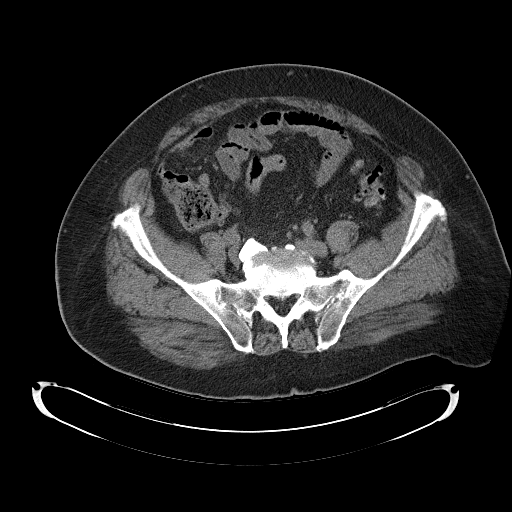
[im 39/94  soft-tissue]
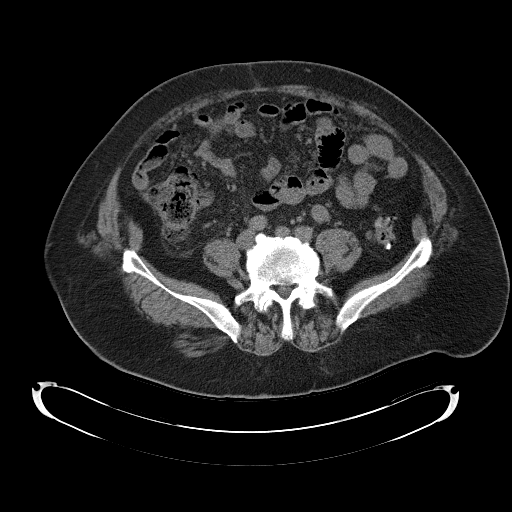
[im 43/94  soft-tissue]
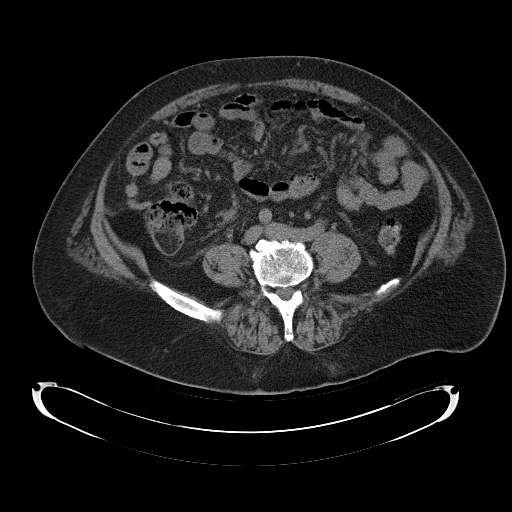
[im 51/94  soft-tissue]
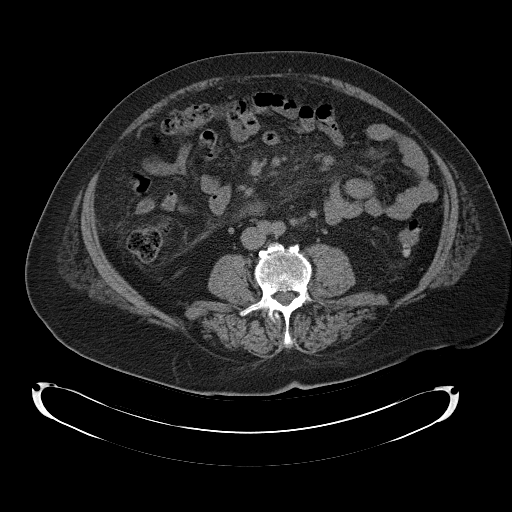
[im 55/94  soft-tissue]
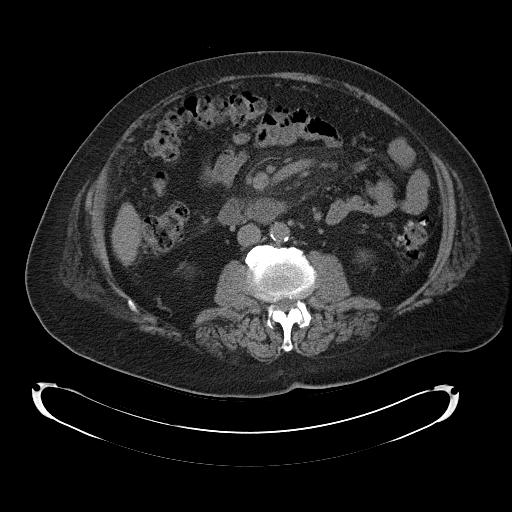
[im 55/94  bone]
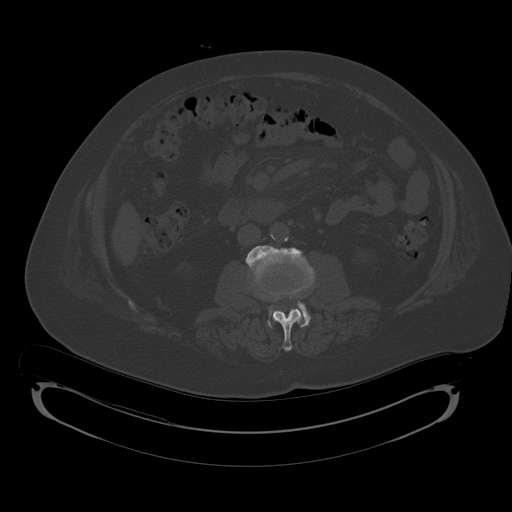
[im 63/94  soft-tissue]
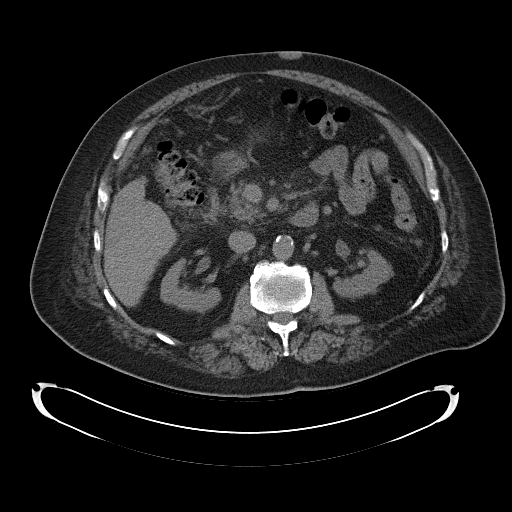
[im 70/94  soft-tissue]
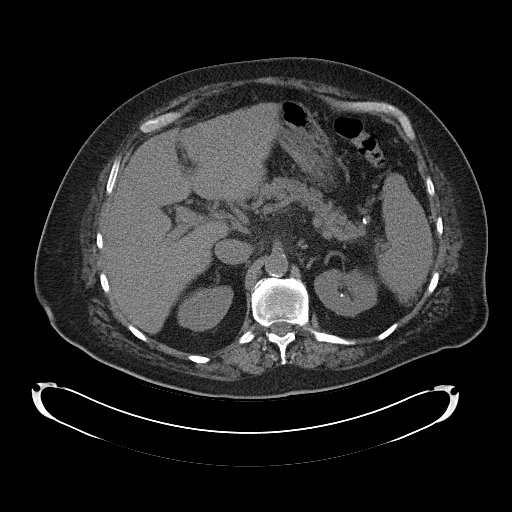
[im 74/94  soft-tissue]
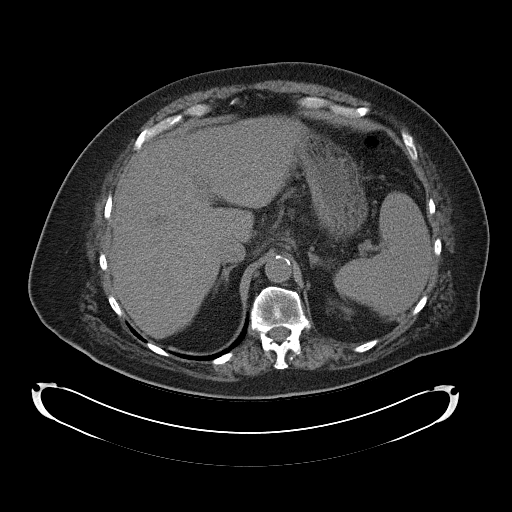
[im 82/94  soft-tissue]
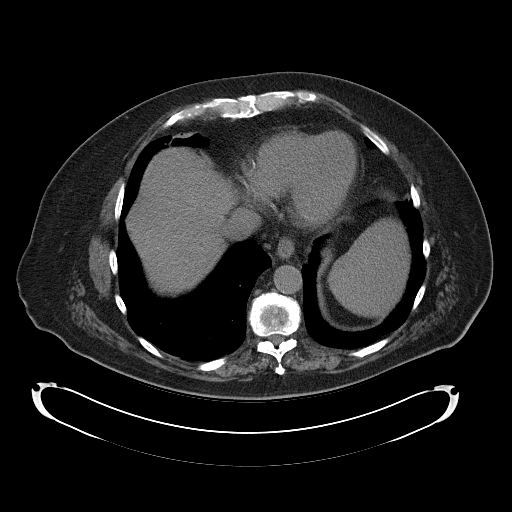
[im 90/94  soft-tissue]
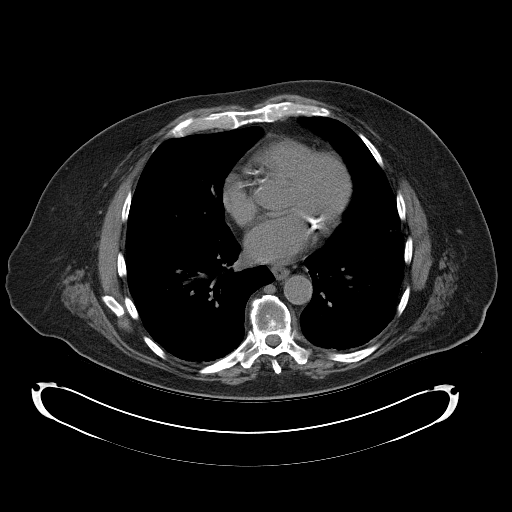

[Series 3: mpr coronal · coronal · 0.91mm/px · 3 of 112 slices shown]
[im 38/112  soft-tissue]
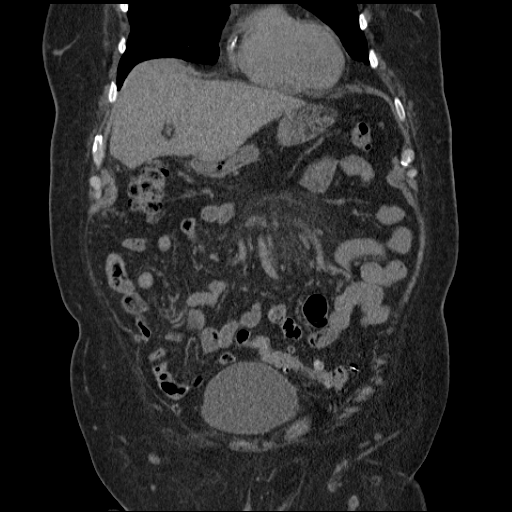
[im 50/112  soft-tissue]
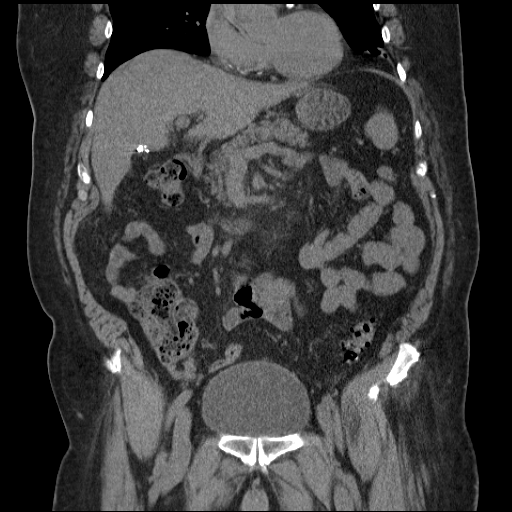
[im 62/112  soft-tissue]
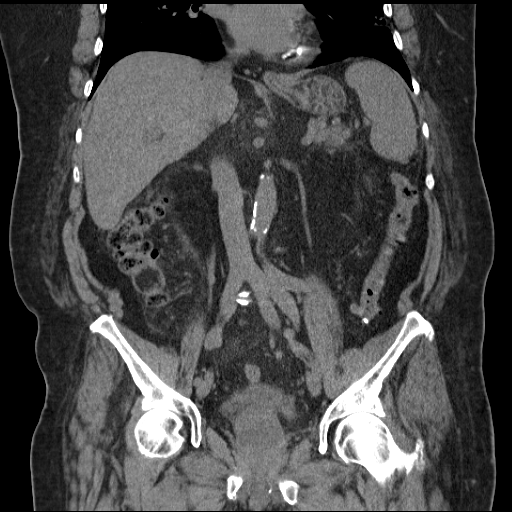

[17 of 46 positions shown; findings below may reference images not displayed]

FINDINGS: Multilevel degenerative disc disease is noted in the lumbar spine.
Probable subsegmental atelectasis or scarring is noted inferiorly in
the lingular segment of left upper lobe.

Status post cholecystectomy. No focal abnormality is noted in the
liver, spleen or pancreas on these unenhanced images. Adrenal glands
appear normal. Small nonobstructive calculus is noted in upper pole
collecting system of left kidney. No hydronephrosis or renal
obstruction is noted. No ureteral calculi are noted. Atherosclerosis
of abdominal aorta is noted without aneurysm formation. 28 x 13 mm
mass is noted along the left bladder wall posteriorly concerning for
malignancy. Diverticulosis of descending and sigmoid colon is noted
without inflammation. There is no evidence of bowel obstruction.
Mild chronic stranding is noted in the mesenteric which was present
on prior exam. No abnormal fluid collection is noted. No significant
adenopathy is noted.
IMPRESSION: Small nonobstructive left renal calculus. No hydronephrosis or renal
obstruction is noted.

Atherosclerosis of abdominal aorta is noted without aneurysm
formation.

Diverticulosis of descending and sigmoid colon is noted without
inflammation.

28 x 13 mm mass is seen along the left bladder wall posteriorly
concerning for malignancy. Cystoscopy is recommended for further
evaluation.

## 2018-01-24 ENCOUNTER — Other Ambulatory Visit (HOSPITAL_COMMUNITY)
Admission: AD | Admit: 2018-01-24 | Discharge: 2018-01-24 | Disposition: A | Discharge status: Home / Self Care | Payer: Medicare Other | Source: Skilled Nursing Facility | Attending: Urology | Admitting: Urology

## 2018-01-24 DIAGNOSIS — C672 Malignant neoplasm of lateral wall of bladder: Secondary | ICD-10-CM | POA: Diagnosis present

## 2018-01-25 LAB — URINE CULTURE

## 2018-01-31 ENCOUNTER — Ambulatory Visit (INDEPENDENT_AMBULATORY_CARE_PROVIDER_SITE_OTHER): Payer: Medicare Other | Admitting: Urology

## 2018-01-31 ENCOUNTER — Other Ambulatory Visit (HOSPITAL_COMMUNITY)
Admission: RE | Admit: 2018-01-31 | Discharge: 2018-01-31 | Disposition: A | Discharge status: Home / Self Care | Payer: Medicare Other | Source: Other Acute Inpatient Hospital | Attending: Urology | Admitting: Urology

## 2018-01-31 DIAGNOSIS — Z8551 Personal history of malignant neoplasm of bladder: Secondary | ICD-10-CM | POA: Diagnosis present

## 2018-01-31 DIAGNOSIS — N471 Phimosis: Secondary | ICD-10-CM

## 2018-01-31 LAB — URINALYSIS, COMPLETE (UACMP) WITH MICROSCOPIC
BILIRUBIN URINE: NEGATIVE
Bacteria, UA: NONE SEEN
GLUCOSE, UA: NEGATIVE mg/dL
Hgb urine dipstick: NEGATIVE
KETONES UR: NEGATIVE mg/dL
NITRITE: POSITIVE — AB
PH: 5 (ref 5.0–8.0)
Protein, ur: NEGATIVE mg/dL
Specific Gravity, Urine: 1.017 (ref 1.005–1.030)

## 2018-02-01 LAB — URINE CULTURE

## 2018-11-27 ENCOUNTER — Encounter (HOSPITAL_COMMUNITY): Payer: Self-pay

## 2018-11-27 ENCOUNTER — Emergency Department (HOSPITAL_COMMUNITY)
Admission: EM | Admit: 2018-11-27 | Discharge: 2018-11-27 | Disposition: A | Discharge status: Home / Self Care | Payer: Medicare Other | Attending: Emergency Medicine | Admitting: Emergency Medicine

## 2018-11-27 ENCOUNTER — Other Ambulatory Visit: Payer: Self-pay

## 2018-11-27 DIAGNOSIS — Z79899 Other long term (current) drug therapy: Secondary | ICD-10-CM | POA: Diagnosis not present

## 2018-11-27 DIAGNOSIS — Y92012 Bathroom of single-family (private) house as the place of occurrence of the external cause: Secondary | ICD-10-CM | POA: Insufficient documentation

## 2018-11-27 DIAGNOSIS — Y93E1 Activity, personal bathing and showering: Secondary | ICD-10-CM | POA: Diagnosis not present

## 2018-11-27 DIAGNOSIS — R42 Dizziness and giddiness: Secondary | ICD-10-CM | POA: Diagnosis not present

## 2018-11-27 DIAGNOSIS — E039 Hypothyroidism, unspecified: Secondary | ICD-10-CM | POA: Insufficient documentation

## 2018-11-27 DIAGNOSIS — W182XXA Fall in (into) shower or empty bathtub, initial encounter: Secondary | ICD-10-CM | POA: Diagnosis not present

## 2018-11-27 DIAGNOSIS — Y999 Unspecified external cause status: Secondary | ICD-10-CM | POA: Insufficient documentation

## 2018-11-27 DIAGNOSIS — W19XXXA Unspecified fall, initial encounter: Secondary | ICD-10-CM

## 2018-11-27 DIAGNOSIS — D696 Thrombocytopenia, unspecified: Secondary | ICD-10-CM | POA: Insufficient documentation

## 2018-11-27 DIAGNOSIS — Z87891 Personal history of nicotine dependence: Secondary | ICD-10-CM | POA: Insufficient documentation

## 2018-11-27 LAB — BASIC METABOLIC PANEL
Anion gap: 10 (ref 5–15)
BUN: 22 mg/dL (ref 8–23)
CO2: 21 mmol/L — ABNORMAL LOW (ref 22–32)
Calcium: 8.5 mg/dL — ABNORMAL LOW (ref 8.9–10.3)
Chloride: 108 mmol/L (ref 98–111)
Creatinine, Ser: 1.12 mg/dL (ref 0.61–1.24)
GFR calc Af Amer: >60 mL/min (ref >60)
GFR calc non Af Amer: 60 mL/min — ABNORMAL LOW (ref >60)
Glucose, Bld: 138 mg/dL — ABNORMAL HIGH (ref 70–99)
Potassium: 3.5 mmol/L (ref 3.5–5.1)
Sodium: 139 mmol/L (ref 135–145)

## 2018-11-27 LAB — CBC
HCT: 36.5 % — ABNORMAL LOW (ref 39.0–52.0)
Hemoglobin: 11.8 g/dL — ABNORMAL LOW (ref 13.0–17.0)
MCH: 32.1 pg (ref 26.0–34.0)
MCHC: 32.3 g/dL (ref 30.0–36.0)
MCV: 99.2 fL (ref 80.0–100.0)
Platelets: 124 10*3/uL — ABNORMAL LOW (ref 150–400)
RBC: 3.68 MIL/uL — ABNORMAL LOW (ref 4.22–5.81)
RDW: 14.8 % (ref 11.5–15.5)
WBC: 5.3 10*3/uL (ref 4.0–10.5)
nRBC: 0 % (ref 0.0–0.2)

## 2018-11-27 NOTE — ED Triage Notes (Addendum)
Pt reports has intermittent dizziness and takes meclizine.  This morning pt got dizzy when he got out of the shower and fell.  Denies any pain.  Denies any loss of consciousness.  Wife told ems he hit his forehead on mirror.    Reports fall was witnessed by his wife.  Pt says r lower back hurts but says was hurting before the fall.  CBG 122 per ems.

## 2018-11-27 NOTE — ED Provider Notes (Signed)
[facility] EMERGENCY DEPARTMENT Provider Note   CSN: [id number] Arrival date & time: 11/27/18  [id number]     History   Chief Complaint Chief Complaint  Patient presents with  . Dizziness    HPI AZLAAN CRISSEY is a 85 y.o. male.     HPI  This patient is a very pleasant 85 year old male with a known history of macular degeneration, prior retinal detachment on the right repaired many years ago and a history of thyroid disease.  He reports a history of frequent dizzy spells for which he takes meclizine.  He reports that when he was getting out of the shower today he was trying to dry off with a towel and when he looked down to dry his legs he became dizzy, he states the next thing he knew, "I was on the floor".  He does not recall exactly if he lost consciousness but specifically denies any chest pain, shortness of breath, abdominal pain, nausea vomiting or diarrhea.  There is been no blood in the stools, no dysuria, no urinary frequency, swelling, rashes, numbness, weakness, headache, neck pain.  He does report some lower back pain which seems to have worsened several days ago in his lower back and radiating down his right leg.  He does have history of intermittent sciatica in the past but this was not a problem today.  Additional history was obtained from the paramedics report that the patient had normal vital signs, normal blood sugar of 120, there is no focal deficits on their exam.  There was a report that he may have hit his head on the mirror in the bathroom  Past Medical History:  Diagnosis Date  . Arthritis   . Glaucoma   . Macular degeneration   . Thyroid disease     Patient Active Problem List   Diagnosis Date Noted  . Bladder cancer ([place]) 03/10/2015    Past Surgical History:  Procedure Laterality Date  . BLADDER SURGERY    . CATARACT EXTRACTION, BILATERAL    . CHOLECYSTECTOMY  11/30/2011   Procedure: LAPAROSCOPIC CHOLECYSTECTOMY;  Surgeon: Jamesetta So, MD;  Location: [facility];  Service: General;  Laterality: N/A;  . CYSTOSCOPY/RETROGRADE/URETEROSCOPY Bilateral 03/10/2015   Procedure: Consuela Mimes BILATERAL Concha Se;  Surgeon: Irine Seal, MD;  Location: [facility];  Service: Urology;  Laterality: Bilateral;  . LYMPH NODE BIOPSY  1975   right  . RETINAL DETACHMENT SURGERY     right eye  . TRANSURETHRAL RESECTION OF BLADDER TUMOR Bilateral 03/10/2015   Procedure: TRANSURETHRAL RESECTION OF BLADDER TUMOR (TURBT) INSTILLATION WITH MITOMYCIN C IN RECOVERY;  Surgeon: Irine Seal, MD;  Location: [facility];  Service: Urology;  Laterality: Bilateral;  . TYMPANOPLASTY     right        Home Medications    Prior to Admission medications   Medication Sig Start Date End Date Taking? Authorizing Provider  albuterol (PROVENTIL HFA;VENTOLIN HFA) 108 (90 Base) MCG/ACT inhaler Inhale 2 puffs into the lungs every 4 (four) hours as needed for wheezing or shortness of breath. 01/05/17   Noemi Chapel, MD  benzonatate (TESSALON) 100 MG capsule Take 1 capsule (100 mg total) by mouth every 8 (eight) hours. 01/05/17   Noemi Chapel, MD  brimonidine (ALPHAGAN) 0.15 % ophthalmic solution Place 1 drop into both eyes 2 (two) times daily.    [provider]  doxycycline (VIBRAMYCIN) 100 MG capsule Take 1 capsule (100 mg total) by mouth 2 (two) times daily. 01/05/17   Noemi Chapel, MD  Ergocalciferol (VITAMIN D2) 400 units TABS Take 1 tablet by mouth daily.    [provider]  latanoprost (XALATAN) 0.005 % ophthalmic solution Place 1 drop into both eyes at bedtime.    [provider]  levothyroxine (SYNTHROID, LEVOTHROID) 75 MCG tablet Take 75 mcg by mouth daily before breakfast.    [provider]  Meth-Hyo-M Bl-Na Phos-Ph Sal (URIBEL) 118 MG CAPS Take 1 capsule (118 mg total) by mouth every 8 (eight) hours as needed. Patient not taking: Reported on 01/05/2017 03/11/15   Franchot Gallo, MD  Multiple Vitamins-Minerals (PRESERVISION AREDS PO) Take 1  tablet by mouth 2 (two) times daily.    [provider]  Omega-3 Fatty Acids (FISH OIL) 500 MG CAPS Take 1 capsule by mouth daily.    [provider]  phenazopyridine (PYRIDIUM) 200 MG tablet Take 1 tablet (200 mg total) by mouth 3 (three) times daily as needed for pain. Patient not taking: Reported on 01/05/2017 03/10/15   Irine Seal, MD  traMADol-acetaminophen (ULTRACET) 37.5-325 MG tablet Take 1 tablet by mouth every 6 (six) hours as needed. Patient not taking: Reported on 01/05/2017 03/10/15   Irine Seal, MD    Family History No family history on file.  Social History Social History   Tobacco Use  . Smoking status: Former Smoker    Quit date: 03/27/1983    Years since quitting: 35.6  . Smokeless tobacco: Never Used  Substance Use Topics  . Alcohol use: No  . Drug use: No     Allergies   Keflex [cephalexin]   Review of Systems Review of Systems  Constitutional: Negative for chills and fever.  HENT: Negative for sore throat.   Eyes: Negative for visual disturbance.  Respiratory: Negative for cough and shortness of breath.   Cardiovascular: Negative for chest pain.  Gastrointestinal: Negative for abdominal pain, diarrhea, nausea and vomiting.  Genitourinary: Negative for dysuria and frequency.  Musculoskeletal: Negative for back pain and neck pain.  Skin: Negative for rash.  Neurological: Positive for dizziness. Negative for weakness, numbness and headaches.  Hematological: Negative for adenopathy.  Psychiatric/Behavioral: Negative for behavioral problems.     Physical Exam Updated Vital Signs BP (!) 128/55   Pulse 67   Temp (!) 97.5 F (36.4 C) (Oral)   Resp 14   Ht 1.803 m (5\' 11" )   Wt 99.8 kg   SpO2 97%   BMI 30.68 kg/m   Physical Exam Vitals signs and nursing note reviewed.  Constitutional:      General: He is not in acute distress.    Appearance: He is well-developed.  HENT:     Head: Normocephalic and atraumatic.      Comments: There is no trauma to the face / head or neck - no hematomas, no contusions, no ecchymosis, and no lacerations.  No maloclussion    Nose: Nose normal.     Mouth/Throat:     Mouth: Mucous membranes are moist.     Pharynx: No oropharyngeal exudate.  Eyes:     General: No scleral icterus.       Right eye: No discharge.        Left eye: No discharge.     Conjunctiva/sclera: Conjunctivae normal.     Pupils: Pupils are equal, round, and reactive to light.     Comments: R pupil asymetrical (prior surgery), 62mm difference  Neck:     Musculoskeletal: Normal range of motion and neck supple.     Thyroid: No thyromegaly.  Vascular: No JVD.  Cardiovascular:     Rate and Rhythm: Normal rate and regular rhythm.     Heart sounds: Normal heart sounds. No murmur. No friction rub. No gallop.   Pulmonary:     Effort: Pulmonary effort is normal. No respiratory distress.     Breath sounds: Normal breath sounds. No wheezing or rales.  Abdominal:     General: Bowel sounds are normal. There is no distension.     Palpations: Abdomen is soft. There is no mass.     Tenderness: There is no abdominal tenderness.  Musculoskeletal: Normal range of motion.        General: No swelling, tenderness or deformity.     Comments: The joints are diffusely supple, the compartments are diffusely soft, he is able to range of motion all 4 extremities without any difficulty.  Lymphadenopathy:     Cervical: No cervical adenopathy.  Skin:    General: Skin is warm and dry.     Findings: No erythema or rash.     Comments: No signs of bruising lacerations contusions hematomas or other injury  Neurological:     Mental Status: He is alert.     Coordination: Coordination normal.     Comments: The patient is able to follow commands, his speech is clear, he has no pronator drift, he has normal strength in all 4 extremities, normal sensation in all 4 extremities and cranial nerves III through XII appear normal.  He does  have a slight asymmetry of the pupils however he has had prior right retinal surgery and has a slight anisocoria from that  Psychiatric:        Behavior: Behavior normal.      ED Treatments / Results  Labs (all labs ordered are listed, but only abnormal results are displayed) Labs Reviewed  CBC - Abnormal; Notable for the following components:      Result Value   RBC 3.68 (*)    Hemoglobin 11.8 (*)    HCT 36.5 (*)    Platelets 124 (*)    All other components within normal limits  BASIC METABOLIC PANEL - Abnormal; Notable for the following components:   CO2 21 (*)    Glucose, Bld 138 (*)    Calcium 8.5 (*)    GFR calc non Af Amer 60 (*)    All other components within normal limits    EKG EKG Interpretation  Date/Time:  Thursday November 27 2018 09:57:09 EDT Ventricular Rate:  67 PR Interval:    QRS Duration: 127 QT Interval:  497 QTC Calculation: 525 R Axis:   -20 Text Interpretation:  Sinus rhythm Prolonged PR interval IVCD, consider atypical LBBB Baseline wander in lead(s) V3 V5 V6 since last tracing no significant change Confirmed by Noemi Chapel [phone]) on 11/27/2018 10:05:04 AM   Radiology No results found.  Procedures Procedures (including critical care time)  Medications Ordered in ED Medications - No data to display   Initial Impression / Assessment and Plan / ED Course  I have reviewed the triage vital signs and the nursing notes.  Pertinent labs & imaging results that were available during my care of the patient were reviewed by me and considered in my medical decision making (see chart for details).  Clinical Course as of Nov 26 1112  Thu Nov 27, 2018  1112 BMP is normal other than mild hyperglycemia, non contributory.  CBC shows no sig anemia - no leukocytosis.  There is mild thrombocytopenia - pt informed -  EKG wiothout acute findigns - VS stable - pt asymptomatic, stable for d/c.   [BM]    Clinical Course User Index [BM] Noemi Chapel, MD        At this time the patient appears to have suffered a fall related to dizziness, he does not have any signs of traumatic injury.  He does get habitually dizzy and it is unclear whether the dizziness today is related to his chronic dizziness although he says it is very similar in sensation.  He has no vertigo at this time, I will check an EKG (patient states he has been told he has a healthy heart) and will check some electrolytes.  He has no other symptoms at this time to suggest another cause of his symptoms and there is no significant head injury to suggest head injury or hematoma on the brain, aneurysm as he has no focal neurologic deficit, no headache and no signs of trauma  Final Clinical Impressions(s) / ED Diagnoses   Final diagnoses:  Dizziness  Fall, initial encounter  Thrombocytopenia ([facility]CC)      Noemi Chapel, MD 11/27/18 1114

## 2018-11-27 NOTE — Discharge Instructions (Signed)
Your testing today shows that you have slightly low platelets.  This will need to be rechecked at your doctor's office within 1 week The rest of your blood work and your EKG are unremarkable and showed no signs of significant problems.  I do want you to follow-up very closely with your family doctor for recheck if you continue to have dizziness. Come back to the emergency department for severe or worsening symptoms

## 2019-01-10 ENCOUNTER — Other Ambulatory Visit: Payer: Self-pay

## 2019-01-10 ENCOUNTER — Inpatient Hospital Stay (HOSPITAL_COMMUNITY)
Admission: EM | Admit: 2019-01-10 | Discharge: 2019-01-12 | DRG: 683 | Disposition: A | Discharge status: Home / Self Care | Payer: Medicare Other | Attending: Family Medicine | Admitting: Family Medicine

## 2019-01-10 ENCOUNTER — Encounter (HOSPITAL_COMMUNITY): Payer: Self-pay

## 2019-01-10 DIAGNOSIS — E039 Hypothyroidism, unspecified: Secondary | ICD-10-CM | POA: Diagnosis present

## 2019-01-10 DIAGNOSIS — Z79891 Long term (current) use of opiate analgesic: Secondary | ICD-10-CM

## 2019-01-10 DIAGNOSIS — H409 Unspecified glaucoma: Secondary | ICD-10-CM | POA: Diagnosis present

## 2019-01-10 DIAGNOSIS — Z833 Family history of diabetes mellitus: Secondary | ICD-10-CM

## 2019-01-10 DIAGNOSIS — N179 Acute kidney failure, unspecified: Secondary | ICD-10-CM | POA: Diagnosis not present

## 2019-01-10 DIAGNOSIS — R55 Syncope and collapse: Secondary | ICD-10-CM

## 2019-01-10 DIAGNOSIS — Z79899 Other long term (current) drug therapy: Secondary | ICD-10-CM

## 2019-01-10 DIAGNOSIS — H3530 Unspecified macular degeneration: Secondary | ICD-10-CM | POA: Diagnosis present

## 2019-01-10 DIAGNOSIS — Z20828 Contact with and (suspected) exposure to other viral communicable diseases: Secondary | ICD-10-CM | POA: Diagnosis present

## 2019-01-10 DIAGNOSIS — E860 Dehydration: Secondary | ICD-10-CM | POA: Diagnosis present

## 2019-01-10 DIAGNOSIS — Z87891 Personal history of nicotine dependence: Secondary | ICD-10-CM

## 2019-01-10 DIAGNOSIS — N390 Urinary tract infection, site not specified: Secondary | ICD-10-CM

## 2019-01-10 DIAGNOSIS — Z8249 Family history of ischemic heart disease and other diseases of the circulatory system: Secondary | ICD-10-CM

## 2019-01-10 DIAGNOSIS — N3000 Acute cystitis without hematuria: Secondary | ICD-10-CM | POA: Diagnosis present

## 2019-01-10 DIAGNOSIS — Z79890 Hormone replacement therapy (postmenopausal): Secondary | ICD-10-CM

## 2019-01-10 DIAGNOSIS — Z881 Allergy status to other antibiotic agents status: Secondary | ICD-10-CM

## 2019-01-10 HISTORY — DX: Urinary tract infection, site not specified: N39.0

## 2019-01-10 LAB — URINALYSIS, ROUTINE W REFLEX MICROSCOPIC
Bacteria, UA: NONE SEEN
Bilirubin Urine: NEGATIVE
Glucose, UA: NEGATIVE mg/dL
Hgb urine dipstick: NEGATIVE
Ketones, ur: NEGATIVE mg/dL
Nitrite: POSITIVE — AB
Protein, ur: NEGATIVE mg/dL
Specific Gravity, Urine: 1.017 (ref 1.005–1.030)
WBC, UA: >50 WBC/hpf — ABNORMAL HIGH (ref 0–5)
pH: 5 (ref 5.0–8.0)

## 2019-01-10 LAB — CBC
HCT: 38.8 % — ABNORMAL LOW (ref 39.0–52.0)
Hemoglobin: 12.4 g/dL — ABNORMAL LOW (ref 13.0–17.0)
MCH: 32.1 pg (ref 26.0–34.0)
MCHC: 32 g/dL (ref 30.0–36.0)
MCV: 100.5 fL — ABNORMAL HIGH (ref 80.0–100.0)
Platelets: 103 10*3/uL — ABNORMAL LOW (ref 150–400)
RBC: 3.86 MIL/uL — ABNORMAL LOW (ref 4.22–5.81)
RDW: 14.4 % (ref 11.5–15.5)
WBC: 6.9 10*3/uL (ref 4.0–10.5)
nRBC: 0 % (ref 0.0–0.2)

## 2019-01-10 LAB — BASIC METABOLIC PANEL
Anion gap: 10 (ref 5–15)
BUN: 36 mg/dL — ABNORMAL HIGH (ref 8–23)
CO2: 21 mmol/L — ABNORMAL LOW (ref 22–32)
Calcium: 9.5 mg/dL (ref 8.9–10.3)
Chloride: 110 mmol/L (ref 98–111)
Creatinine, Ser: 1.8 mg/dL — ABNORMAL HIGH (ref 0.61–1.24)
GFR calc Af Amer: 39 mL/min — ABNORMAL LOW (ref >60)
GFR calc non Af Amer: 34 mL/min — ABNORMAL LOW (ref >60)
Glucose, Bld: 137 mg/dL — ABNORMAL HIGH (ref 70–99)
Potassium: 4.5 mmol/L (ref 3.5–5.1)
Sodium: 141 mmol/L (ref 135–145)

## 2019-01-10 MED ORDER — SULFAMETHOXAZOLE-TRIMETHOPRIM 800-160 MG PO TABS
1.0000 | ORAL_TABLET | Freq: Two times a day (BID) | ORAL | Status: DC
  Administered 2019-01-10 – 2019-01-12 (×4): 1 via ORAL
  Filled 2019-01-10 (×4): qty 1

## 2019-01-10 MED ORDER — PHENAZOPYRIDINE HCL 100 MG PO TABS: 200.0000 mg | ORAL_TABLET | Freq: Three times a day (TID) | ORAL | Status: DC

## 2019-01-10 MED ORDER — KETOTIFEN FUMARATE 0.025 % OP SOLN
1.0000 [drp] | Freq: Two times a day (BID) | OPHTHALMIC | Status: DC
  Administered 2019-01-11 – 2019-01-12 (×3): 1 [drp] via OPHTHALMIC
  Filled 2019-01-10: qty 5

## 2019-01-10 MED ORDER — ACETAMINOPHEN 650 MG RE SUPP: 650.0000 mg | Freq: Four times a day (QID) | RECTAL | Status: DC | PRN

## 2019-01-10 MED ORDER — HEPARIN SODIUM (PORCINE) 5000 UNIT/ML IJ SOLN
5000.0000 [IU] | Freq: Three times a day (TID) | INTRAMUSCULAR | Status: DC
  Administered 2019-01-10 – 2019-01-12 (×5): 5000 [IU] via SUBCUTANEOUS
  Filled 2019-01-10 (×5): qty 1

## 2019-01-10 MED ORDER — VANCOMYCIN HCL 1.5 G IV SOLR
1500.0000 mg | Freq: Once | INTRAVENOUS | Status: DC
  Filled 2019-01-10: qty 1500

## 2019-01-10 MED ORDER — LEVOTHYROXINE SODIUM 75 MCG PO TABS
75.0000 ug | ORAL_TABLET | Freq: Every day | ORAL | Status: DC
Start: 2019-01-11 — End: 2019-01-12
  Administered 2019-01-11 – 2019-01-12 (×2): 75 ug via ORAL
  Filled 2019-01-10 (×2): qty 1

## 2019-01-10 MED ORDER — BRIMONIDINE TARTRATE 0.15 % OP SOLN
1.0000 [drp] | Freq: Two times a day (BID) | OPHTHALMIC | Status: DC
  Administered 2019-01-11 – 2019-01-12 (×3): 1 [drp] via OPHTHALMIC
  Filled 2019-01-10: qty 5

## 2019-01-10 MED ORDER — LATANOPROST 0.005 % OP SOLN
1.0000 [drp] | Freq: Every day | OPHTHALMIC | Status: DC
  Administered 2019-01-11: 21:00:00 1 [drp] via OPHTHALMIC
  Filled 2019-01-10: qty 2.5

## 2019-01-10 MED ORDER — MECLIZINE HCL 12.5 MG PO TABS
12.5000 mg | ORAL_TABLET | Freq: Two times a day (BID) | ORAL | Status: DC
  Administered 2019-01-10 – 2019-01-12 (×4): 12.5 mg via ORAL
  Filled 2019-01-10 (×4): qty 1

## 2019-01-10 MED ORDER — VANCOMYCIN HCL IN DEXTROSE 1-5 GM/200ML-% IV SOLN: 1000.0000 mg | INTRAVENOUS | Status: DC

## 2019-01-10 MED ORDER — SODIUM CHLORIDE 0.9 % IV BOLUS
1000.0000 mL | Freq: Once | INTRAVENOUS | Status: AC
  Administered 2019-01-10: 17:00:00 1000 mL via INTRAVENOUS

## 2019-01-10 MED ORDER — SODIUM CHLORIDE 0.9 % IV SOLN
INTRAVENOUS | Status: DC
  Administered 2019-01-10: 21:00:00 via INTRAVENOUS

## 2019-01-10 MED ORDER — ACETAMINOPHEN 325 MG PO TABS: 650.0000 mg | ORAL_TABLET | Freq: Four times a day (QID) | ORAL | Status: DC | PRN

## 2019-01-10 MED ORDER — SODIUM CHLORIDE 0.9 % IV SOLN
INTRAVENOUS | Status: DC
  Administered 2019-01-10 – 2019-01-12 (×2): via INTRAVENOUS

## 2019-01-10 MED ORDER — ALLOPURINOL 100 MG PO TABS
100.0000 mg | ORAL_TABLET | Freq: Every day | ORAL | Status: DC
  Administered 2019-01-11 – 2019-01-12 (×2): 100 mg via ORAL
  Filled 2019-01-10 (×2): qty 1

## 2019-01-10 NOTE — ED Triage Notes (Signed)
Pt brought to ED via [facility]. Pt had syncope episode at home and family found him outside in barn. BS 106. Pt had been out there approx 30 min. Pt states he was putting bag on a lawnmower and remembers getting dizzy before he passed out. Pt had a similar episode in Sept.

## 2019-01-10 NOTE — Progress Notes (Addendum)
Pharmacy Antibiotic Note  Gabriel Spencer is a 85 y.o. male admitted on 01/10/2019 with UTI.  Pharmacy has been consulted for Vancomycin dosing.  Plan: Vancomycin 1500mg  loading dose, then 1000mg  IV every 24 hours.  Goal trough 10-15 mcg/mL.  F/U cxs and clinical progress Monitor V/S, labs and levels as indicated  Height: 5\' 11"  (180.3 cm) Weight: 220 lb (99.8 kg) IBW/kg (Calculated) : 75.3  Temp (24hrs), Avg:97.5 F (36.4 C), Min:97.5 F (36.4 C), Max:97.5 F (36.4 C)  Recent Labs  Lab 01/10/19 1728  WBC 6.9  CREATININE 1.80*    Estimated Creatinine Clearance: 36.1 mL/min (A) (by C-G formula based on SCr of 1.8 mg/dL (H)).    Allergies  Allergen Reactions  . Keflex [Cephalexin] Hives    Antimicrobials this admission: Vancomycin 10/17>>  Microbiology results: 10/17 UCx:   Thank you for allowing pharmacy to be a part of this patient's care.  Isac Sarna, BS Pharm D, [place] Clinical Pharmacist Pager [phone] 01/10/2019 7:02 PM

## 2019-01-10 NOTE — ED Provider Notes (Signed)
Medical screening examination/treatment/procedure(s) were conducted as a shared visit with non-physician practitioner(s) and myself.  I personally evaluated the patient during the encounter.  EKG Interpretation  Date/Time:  Saturday January 10 2019 17:01:11 EDT Ventricular Rate:  88 PR Interval:    QRS Duration: 132 QT Interval:  420 QTC Calculation: 509 R Axis:   -32 Text Interpretation:  Sinus rhythm Prolonged PR interval Nonspecific intraventricular conduction delay Confirmed by Fredia Sorrow [phone]) on 01/10/2019 6:23:44 PM   Patient seen by me along with physician assistant.  Patient brought in by [facility].  Patient had a syncopal or near syncopal event at home.  If there was no loss of consciousness it was relatively brief.  Family member found him outside in the barn.  He was to move weak to get up.  Blood sugar was 106.  Patient has been not kind of air on his own for about 30 minutes.  Patient was putting back on [employer] remembers getting dizzy 40 passed out.  Had a similar episode in September.  Here patient had orthostatic hypotension but was not symptomatic.  Given some IV fluids.  Lab results are consistent with acute kidney injury significant change in renal function compared to just in September.  Also urinalysis with positive nitrite and suggestive of urinary tract infection.  Or possible prostate infection.  Patient was started on antibiotics.  I contacted hospitalist who will admit.  For the acute kidney injury as well as the probable syncopal episode.  And for treatment of the urinary tract infection.  Patient is not febrile.  Not tachycardic not hypotensive at rest.   Fredia Sorrow, MD 01/10/19 1850

## 2019-01-10 NOTE — ED Notes (Signed)
ED TO INPATIENT HANDOFF REPORT  ED Nurse Name and Phone #: [phone]  S Name/Age/Gender Gabriel Spencer 85 y.o. male Room/Bed: APA05/APA05  Code Status   Code Status: Prior  Home/SNF/Other Home Patient oriented to: self Is this baseline? Yes   Triage Complete: Triage complete  Chief Complaint Fall  Triage Note Pt brought to ED via [place]. Pt had syncope episode at home and family found him outside in barn. BS 106. Pt had been out there approx 30 min. Pt states he was putting bag on a lawnmower and remembers getting dizzy before he passed out. Pt had a similar episode in Sept.    Allergies Allergies  Allergen Reactions  . Keflex [Cephalexin] Hives    Level of Care/Admitting Diagnosis ED Disposition    ED Disposition Condition [place] [facility]: [facility] [id number]  Level of Care: Telemetry [5]  Covid Evaluation: Asymptomatic Screening Protocol (No Symptoms)  Diagnosis: [facility]acute kidney injury) [id number]  Admitting Physician: Manfred Shirts  Attending Physician: Waldron Labs, DAWOOD S [[id number]  PT Class (Do Not Modify): Observation [104]  PT Acc Code (Do Not Modify): Observation [10022]       B Medical/Surgery History Past Medical History:  Diagnosis Date  . Acute lower UTI 01/10/2019  . Arthritis   . Glaucoma   . Macular degeneration   . Thyroid disease    Past Surgical History:  Procedure Laterality Date  . BLADDER SURGERY    . CATARACT EXTRACTION, BILATERAL    . CHOLECYSTECTOMY  11/30/2011   Procedure: LAPAROSCOPIC CHOLECYSTECTOMY;  Surgeon: Jamesetta So, MD;  Location: [facility];  Service: General;  Laterality: N/A;  . CYSTOSCOPY/RETROGRADE/URETEROSCOPY Bilateral 03/10/2015   Procedure: Consuela Mimes BILATERAL Concha Se;  Surgeon: Irine Seal, MD;  Location: [facility];  Service: Urology;  Laterality: Bilateral;  . LYMPH NODE BIOPSY  1975   right  . RETINAL DETACHMENT SURGERY     right eye  . TRANSURETHRAL RESECTION  OF BLADDER TUMOR Bilateral 03/10/2015   Procedure: TRANSURETHRAL RESECTION OF BLADDER TUMOR (TURBT) INSTILLATION WITH MITOMYCIN C IN RECOVERY;  Surgeon: Irine Seal, MD;  Location: [facility];  Service: Urology;  Laterality: Bilateral;  . TYMPANOPLASTY     right     A IV Location/Drains/Wounds Patient Lines/Drains/Airways Status   Active Line/Drains/Airways    Name:   Placement date:   Placement time:   Site:   Days:   Peripheral IV 01/10/19 Right Wrist   01/10/19    1716    Wrist   less than 1          Intake/Output Last 24 hours No intake or output data in the 24 hours ending 01/10/19 1916  Labs/Imaging Results for orders placed or performed during the hospital encounter of 01/10/19 (from the past 48 hour(s))  Urinalysis, Routine w reflex microscopic     Status: Abnormal   Collection Time: 01/10/19  5:21 PM  Result Value Ref Range   Color, Urine YELLOW YELLOW   APPearance HAZY (A) CLEAR   Specific Gravity, Urine 1.017 1.005 - 1.030   pH 5.0 5.0 - 8.0   Glucose, UA NEGATIVE NEGATIVE mg/dL   Hgb urine dipstick NEGATIVE NEGATIVE   Bilirubin Urine NEGATIVE NEGATIVE   Ketones, ur NEGATIVE NEGATIVE mg/dL   Protein, ur NEGATIVE NEGATIVE mg/dL   Nitrite POSITIVE (A) NEGATIVE   Leukocytes,Ua TRACE (A) NEGATIVE   RBC / HPF 0-5 0 - 5 RBC/hpf   WBC, UA >50 (H) 0 -  5 WBC/hpf   Bacteria, UA NONE SEEN NONE SEEN   Mucus PRESENT    Hyaline Casts, UA PRESENT     Comment: Performed at [facility], [address], [place], [place] [address]  CBC     Status: Abnormal   Collection Time: 01/10/19  5:28 PM  Result Value Ref Range   WBC 6.9 4.0 - 10.5 K/uL   RBC 3.86 (L) 4.22 - 5.81 MIL/uL   Hemoglobin 12.4 (L) 13.0 - 17.0 g/dL   HCT 38.8 (L) 39.0 - 52.0 %   MCV 100.5 (H) 80.0 - 100.0 fL   MCH 32.1 26.0 - 34.0 pg   MCHC 32.0 30.0 - 36.0 g/dL   RDW 14.4 11.5 - 15.5 %   Platelets 103 (L) 150 - 400 K/uL    Comment: SPECIMEN CHECKED FOR CLOTS   nRBC 0.0 0.0 - 0.2 %    Comment: Performed at  [facility], [address] [place], [place] [id number]  Basic metabolic panel     Status: Abnormal   Collection Time: 01/10/19  5:28 PM  Result Value Ref Range   Sodium 141 135 - 145 mmol/L   Potassium 4.5 3.5 - 5.1 mmol/L   Chloride 110 98 - 111 mmol/L   CO2 21 (L) 22 - 32 mmol/L   Glucose, Bld 137 (H) 70 - 99 mg/dL   BUN 36 (H) 8 - 23 mg/dL   Creatinine, Ser 1.80 (H) 0.61 - 1.24 mg/dL   Calcium 9.5 8.9 - 10.3 mg/dL   GFR calc non Af Amer 34 (L) >60 mL/min   GFR calc Af Amer 39 (L) >60 mL/min   Anion gap 10 5 - 15    Comment: Performed at [facility], [address] [place], [place] [address]   No results found.  Pending Labs [employer] (From admission, onward)    Start     Ordered   01/10/19 1830  SARS CORONAVIRUS 2 (TAT 6-24 HRS) Nasopharyngeal Nasopharyngeal Swab  (Asymptomatic/Tier 2 Patients Labs)  Once,   STAT    Question Answer Comment  Is this test for diagnosis or screening Screening   Symptomatic for COVID-19 as defined by CDC No   Hospitalized for COVID-19 No   Admitted to ICU for COVID-19 No   Previously tested for COVID-19 No   Resident in a congregate (group) care setting No   Employed in healthcare setting No      01/10/19 1830   01/10/19 1814  Urine culture  Add-on,   AD     01/10/19 1813   Signed and Held  CBC  (heparin)  Once,   R    Comments: Baseline for heparin therapy IF NOT ALREADY DRAWN.  Notify MD if PLT < 100 K.    Signed and Held   Signed and Held  Basic metabolic panel  Tomorrow morning,   R     Signed and Held   Signed and Held  CBC  Tomorrow morning,   R     Signed and Held          Vitals/Pain Today's Vitals   01/10/19 1730 01/10/19 1800 01/10/19 1830 01/10/19 1900  BP: (!) 141/64 113/69 (!) 144/69 (!) 150/70  Pulse: 88 88  94  Resp: 16 16 16 18   Temp:      TempSrc:      SpO2: 99% 99%  99%  Weight:      Height:      PainSc:  Isolation Precautions No active isolations  Medications Medications  0.9 %  sodium  chloride infusion (has no administration in time range)  sulfamethoxazole-trimethoprim (BACTRIM DS) 800-160 MG per tablet 1 tablet (has no administration in time range)  sodium chloride 0.9 % bolus 1,000 mL (1,000 mLs Intravenous New Bag/Given 01/10/19 1718)    Mobility walks Low fall risk   Focused Assessments    R Recommendations: See Admitting Provider Note  Report given to:   Additional Notes:

## 2019-01-10 NOTE — Progress Notes (Signed)
Patient ran 6 beat run of V-tach.  Vitals are stable and patient is asymptomatic.  On-call MD notified via text page.  Will continue to monitor.

## 2019-01-10 NOTE — ED Provider Notes (Signed)
[facility] EMERGENCY DEPARTMENT Provider Note   CSN: [id number] Arrival date & time: 01/10/19  1639     History   Chief Complaint Chief Complaint  Patient presents with  . Near Syncope    HPI Gabriel Spencer is a 85 y.o. male with a hx of macular degeneration, glaucoma, prior retinal detachment on the right repaired many years ago, thyroid disease, bladder cancer s/p transurethral resection & recurrent dizzy spells presents to the ED s/p near syncope vs. Syncope which occurred within 1 hour PTA. Patient states that he was in the barn working on [employer] & when he bent over to put a new bag on he became dizzy/lightheaded as if he my pass out and then when he stood up this worsened and he had to steady himself. He is unsure if he passed out but did fall to the ground. Believes he possibly had LOC. Denies head injury. Wife called 911 because he felt he could not get up on his own. Currently feels almost back to baseline. States he has had multiple prior episodes- most recently seen in the ED 1 month ago for what he relays was similar. Denies nausea, vomiting, diaphoresis, chest pain, or dyspnea with the event or at present. Denies visual disturbance, numbness, weakness, room spinning, vomiting, diarrhea, abdominal pain, or gait difficulty.   HPI  Past Medical History:  Diagnosis Date  . Arthritis   . Glaucoma   . Macular degeneration   . Thyroid disease     Patient Active Problem List   Diagnosis Date Noted  . Bladder cancer ([place]) 03/10/2015    Past Surgical History:  Procedure Laterality Date  . BLADDER SURGERY    . CATARACT EXTRACTION, BILATERAL    . CHOLECYSTECTOMY  11/30/2011   Procedure: LAPAROSCOPIC CHOLECYSTECTOMY;  Surgeon: Jamesetta So, MD;  Location: [facility];  Service: General;  Laterality: N/A;  . CYSTOSCOPY/RETROGRADE/URETEROSCOPY Bilateral 03/10/2015   Procedure: Consuela Mimes BILATERAL Concha Se;  Surgeon: Irine Seal, MD;  Location: [facility];  Service: Urology;   Laterality: Bilateral;  . LYMPH NODE BIOPSY  1975   right  . RETINAL DETACHMENT SURGERY     right eye  . TRANSURETHRAL RESECTION OF BLADDER TUMOR Bilateral 03/10/2015   Procedure: TRANSURETHRAL RESECTION OF BLADDER TUMOR (TURBT) INSTILLATION WITH MITOMYCIN C IN RECOVERY;  Surgeon: Irine Seal, MD;  Location: [facility];  Service: Urology;  Laterality: Bilateral;  . TYMPANOPLASTY     right        Home Medications    Prior to Admission medications   Medication Sig Start Date End Date Taking? Authorizing Provider  albuterol (PROVENTIL HFA;VENTOLIN HFA) 108 (90 Base) MCG/ACT inhaler Inhale 2 puffs into the lungs every 4 (four) hours as needed for wheezing or shortness of breath. Patient not taking: Reported on 11/27/2018 01/05/17   Noemi Chapel, MD  allopurinol (ZYLOPRIM) 100 MG tablet Take 100 mg by mouth daily.    [provider]  benzonatate (TESSALON) 100 MG capsule Take 1 capsule (100 mg total) by mouth every 8 (eight) hours. Patient not taking: Reported on 11/27/2018 01/05/17   Noemi Chapel, MD  brimonidine (ALPHAGAN) 0.15 % ophthalmic solution Place 1 drop into both eyes 2 (two) times daily.    [provider]  doxycycline (VIBRAMYCIN) 100 MG capsule Take 1 capsule (100 mg total) by mouth 2 (two) times daily. Patient not taking: Reported on 11/27/2018 01/05/17   Noemi Chapel, MD  Ergocalciferol (VITAMIN D2) 400 units TABS Take 1 tablet by mouth daily.  [provider]  ketotifen (ZADITOR) 0.025 % ophthalmic solution Place 1 drop into both eyes 2 (two) times daily.    [provider]  latanoprost (XALATAN) 0.005 % ophthalmic solution Place 1 drop into both eyes at bedtime.    [provider]  levothyroxine (SYNTHROID, LEVOTHROID) 75 MCG tablet Take 75 mcg by mouth daily before breakfast.    [provider]  meclizine (ANTIVERT) 12.5 MG tablet Take 12.5 mg by mouth 2 (two) times daily.    [provider]  Meth-Hyo-M Bl-Na  Phos-Ph Sal (URIBEL) 118 MG CAPS Take 1 capsule (118 mg total) by mouth every 8 (eight) hours as needed. Patient not taking: Reported on 01/05/2017 03/11/15   Franchot Gallo, MD  Multiple Vitamins-Minerals (PRESERVISION AREDS PO) Take 1 tablet by mouth 2 (two) times daily.    [provider]  phenazopyridine (PYRIDIUM) 200 MG tablet Take 1 tablet (200 mg total) by mouth 3 (three) times daily as needed for pain. Patient not taking: Reported on 01/05/2017 03/10/15   Irine Seal, MD  traMADol-acetaminophen (ULTRACET) 37.5-325 MG tablet Take 1 tablet by mouth every 6 (six) hours as needed. 03/10/15   Irine Seal, MD    Family History No family history on file.  Social History Social History   Tobacco Use  . Smoking status: Former Smoker    Quit date: 03/27/1983    Years since quitting: 35.8  . Smokeless tobacco: Never Used  Substance Use Topics  . Alcohol use: No  . Drug use: No     Allergies   Keflex [cephalexin]   Review of Systems Review of Systems  Constitutional: Negative for chills, diaphoresis and fever.  Eyes: Negative for visual disturbance.  Respiratory: Negative for cough and shortness of breath.   Cardiovascular: Negative for chest pain.  Gastrointestinal: Negative for abdominal pain, nausea and vomiting.  Genitourinary: Negative for dysuria.  Neurological: Positive for dizziness (almost resolved @ present), syncope (?\) and light-headedness (almost resolved @ present). Negative for seizures, facial asymmetry, speech difficulty, weakness, numbness and headaches.  All other systems reviewed and are negative.    Physical Exam Updated Vital Signs BP (!) 109/42 (BP Location: Left Arm)   Pulse 90   Temp (!) 97.5 F (36.4 C) (Oral)   Resp (!) 21   Ht 5\' 11"  (1.803 m)   Wt 99.8 kg   SpO2 98%   BMI 30.68 kg/m   Physical Exam Vitals signs and nursing note reviewed.  Constitutional:      General: He is not in acute distress.    Appearance: He is  well-developed. He is not toxic-appearing.  HENT:     Head: Normocephalic and atraumatic.     Comments: No raccoon eyes/battle sign.     Ears:     Comments: No hemotympanum.  Eyes:     General:        Right eye: No discharge.        Left eye: No discharge.     Extraocular Movements: Extraocular movements intact.     Conjunctiva/sclera: Conjunctivae normal.     Comments: Asymmetric reactive pupils- hx of prior surgery.  No nystagmus.  Neck:     Musculoskeletal: Normal range of motion and neck supple. No muscular tenderness.  Cardiovascular:     Rate and Rhythm: Normal rate and regular rhythm.  Pulmonary:     Effort: Pulmonary effort is normal. No respiratory distress.     Breath sounds: Normal breath sounds. No wheezing, rhonchi or rales.  Chest:  Chest wall: No tenderness.  Abdominal:     General: There is no distension.     Palpations: Abdomen is soft.     Tenderness: There is no abdominal tenderness. There is no guarding or rebound.  Musculoskeletal:     Comments: Upper/lower extremities: Intact AROM without point/focal bony tenderness.  Back: No midline tenderness.   Skin:    General: Skin is warm and dry.     Findings: No rash.  Neurological:     Mental Status: He is alert.     Comments: Alert. Clear speech. No facial droop. CNIII-XII grossly intact. Bilateral upper and lower extremities' sensation grossly intact. 5/5 symmetric strength with grip strength and with plantar and dorsi flexion bilaterally.  Normal finger to nose bilaterally. Negative pronator drift.   Psychiatric:        Behavior: Behavior normal.    ED Treatments / Results  Labs (all labs ordered are listed, but only abnormal results are displayed) Labs Reviewed  CBC - Abnormal; Notable for the following components:      Result Value   RBC 3.86 (*)    Hemoglobin 12.4 (*)    HCT 38.8 (*)    MCV 100.5 (*)    Platelets 103 (*)    All other components within normal limits  BASIC METABOLIC PANEL -  Abnormal; Notable for the following components:   CO2 21 (*)    Glucose, Bld 137 (*)    BUN 36 (*)    Creatinine, Ser 1.80 (*)    GFR calc non Af Amer 34 (*)    GFR calc Af Amer 39 (*)    All other components within normal limits  URINALYSIS, ROUTINE W REFLEX MICROSCOPIC - Abnormal; Notable for the following components:   APPearance HAZY (*)    Nitrite POSITIVE (*)    Leukocytes,Ua TRACE (*)    WBC, UA >50 (*)    All other components within normal limits  URINE CULTURE  SARS CORONAVIRUS 2 (TAT 6-24 HRS)    EKG EKG Interpretation  Date/Time:  Saturday January 10 2019 17:01:11 EDT Ventricular Rate:  88 PR Interval:    QRS Duration: 132 QT Interval:  420 QTC Calculation: 509 R Axis:   -32 Text Interpretation:  Sinus rhythm Prolonged PR interval Nonspecific intraventricular conduction delay Confirmed by Fredia Sorrow [phone]) on 01/10/2019 6:23:44 PM   Radiology No results found.  Procedures Procedures (including critical care time)  Medications Ordered in ED Medications - No data to display   Initial Impression / Assessment and Plan / ED Course  I have reviewed the triage vital signs and the nursing notes.  Pertinent labs & imaging results that were available during my care of the patient were reviewed by me and considered in my medical decision making (see chart for details).   Patient presents status post near syncope versus syncope after position change for lightheadedness/dizziness.  Patient nontoxic-appearing, no apparent distress, vitals with soft BP otherwise without significant abnormalities.  No signs of head/neck/back/chest/abdominal trauma.  No focal neurologic deficits.  Plan for labs and cardiac monitoring.  Orthostatic VS for the past 24 hrs:  BP- Lying Pulse- Lying BP- Sitting Pulse- Sitting BP- Standing at 0 minutes Pulse- Standing at 0 minutes  01/10/19 1712 123/50 86 135/53 82 109/42 89    No significant symptoms with orthostatics, however does  have change in BP, states he has had minimal p.o. intake today, will order 1 L of fluids.  EKG: Sinus rhythm Prolonged PR interval Nonspecific  intraventricular conduction delay CBC: anemia improved from prior.  No leukocytosis.  Thrombocytopenia slightly worse than previously. BMP: Creatinine/BUN elevated consistent with AKI, today 1.80 and 36 one month prior 1.12 and 22 respectively.  Mild hyperglycemia.  No significant electrolyte derangement. Urinalysis: Concerning for infectious process.  Patient denies urinary symptoms, testicular pain/swelling, or rectal pain with bowel movements -we will send for culture & discuss abx w/ pharmacy given hx of allergic reaction to keflex & QTc 509.   Discussed findings and plan of care with supervising physician Dr. Ronie Spies admission for AKI with syncope versus near syncope.  I am in agreement.  Consult placed to hospitalist service..   18:42: CONSULT: Discussed with hospitalist Dr. Waldron Labs who accepts admission.   18:50: CONSULT: Discussed with pharmacist Apolonio Schneiders- reviewed prior cultures, given allergies & QTc- recommends vanc per pharmacy consult to adjust for his renal function which has been ordered.   This is a shared visit with supervising physician Dr. Rogene Houston who has independently evaluated patient & provided guidance in evaluation/management/disposition, in agreement with care   EHAB LEANDRO was evaluated in Emergency Department on 01/10/2019 for the symptoms described in the history of present illness. He/she was evaluated in the context of the global COVID-19 pandemic, which necessitated consideration that the patient might be at risk for infection with the SARS-CoV-2 virus that causes COVID-19. Institutional protocols and algorithms that pertain to the evaluation of patients at risk for COVID-19 are in a state of rapid change based on information released by regulatory bodies including the CDC and federal and state organizations. These  policies and algorithms were followed during the patient's care in the ED.  Final Clinical Impressions(s) / ED Diagnoses   Final diagnoses:  Syncope, unspecified syncope type  AKI (acute kidney injury) [facility])  Acute cystitis without hematuria    ED Discharge Orders    None       Amaryllis Dyke, PA-C 01/10/19 1852    Fredia Sorrow, MD 01/20/19 [phone]

## 2019-01-10 NOTE — H&P (Signed)
TRH H&P   Patient Demographics:    Gabriel Spencer, is a 85 y.o. male  MRN: [id number]   DOB - 01-05-1932  Admit Date - 01/10/2019  Outpatient Primary MD for the patient is Doree Albee, MD  Referring MD/NP/PA: PA Samantha  Patient coming from: Home  Chief Complaint  Patient presents with  . Near Syncope      HPI:    Gabriel Spencer  is a 85 y.o. male, with past medical history of macular degeneration, glaucoma, preretinal detachment on the right repaired many years ago, hypothyroidism, bladder cancer status post transurethral resection, history of recurrent dizziness spell, hypothyroidism, presents to ED secondary to syncopal episode, patient report he was working on his lawnmower today, he bent over to put in new bag, and upon standing he felt dizzy, lightheaded, and he passed out, reports he did lose consciousness, for a few seconds, wife called 911, by then patient back to baseline, he denies any nausea, vomiting, diaphoresis, chest pain, fever, hemoptysis, recent travel, focal deficits, tingling or numbness. - in ED he was noted to be mildly orthostatic, sitting 135/53, standing 109/42, with worsening renal function, creatinine at 1.8, he has positive urine analysis, started on IV fluid and I was called to admit.    Review of systems:    In addition to the HPI above, No Fever-chills, he reports an episode of syncope. No Headache, No changes with Vision or hearing, No problems swallowing food or Liquids, No Chest pain, Cough or Shortness of Breath, No Abdominal pain, No Nausea or Vommitting, Bowel movements are regular, No Blood in stool or Urine, No dysuria, No new skin rashes or bruises, No new joints pains-aches,  No new weakness, tingling, numbness in any extremity, No recent weight gain or loss, No polyuria, polydypsia or polyphagia, No significant Mental  Stressors.  A full 10 point Review of Systems was done, except as stated above, all other Review of Systems were negative.   With Past History of the following :    Past Medical History:  Diagnosis Date  . Acute lower UTI 01/10/2019  . Arthritis   . Glaucoma   . Macular degeneration   . Thyroid disease       Past Surgical History:  Procedure Laterality Date  . BLADDER SURGERY    . CATARACT EXTRACTION, BILATERAL    . CHOLECYSTECTOMY  11/30/2011   Procedure: LAPAROSCOPIC CHOLECYSTECTOMY;  Surgeon: Jamesetta So, MD;  Location: [facility];  Service: General;  Laterality: N/A;  . CYSTOSCOPY/RETROGRADE/URETEROSCOPY Bilateral 03/10/2015   Procedure: Consuela Mimes BILATERAL [place];  Surgeon: Irine Seal, MD;  Location: [facility];  Service: Urology;  Laterality: Bilateral;  . LYMPH NODE BIOPSY  1975   right  . RETINAL DETACHMENT SURGERY     right eye  . TRANSURETHRAL RESECTION OF BLADDER TUMOR Bilateral 03/10/2015   Procedure: TRANSURETHRAL RESECTION OF BLADDER TUMOR (TURBT) INSTILLATION WITH MITOMYCIN  C IN RECOVERY;  Surgeon: Irine Seal, MD;  Location: [facility];  Service: Urology;  Laterality: Bilateral;  . TYMPANOPLASTY     right      Social History:     Social History   Tobacco Use  . Smoking status: Former Smoker    Quit date: 03/27/1983    Years since quitting: 35.8  . Smokeless tobacco: Never Used  Substance Use Topics  . Alcohol use: No      Family History :   Reports family history of diabetes mellitus, hypertension in his sister who passed away for few years ago.   Home Medications:   Prior to Admission medications   Medication Sig Start Date End Date Taking? Authorizing Provider  albuterol (PROVENTIL HFA;VENTOLIN HFA) 108 (90 Base) MCG/ACT inhaler Inhale 2 puffs into the lungs every 4 (four) hours as needed for wheezing or shortness of breath. 01/05/17  Yes Noemi Chapel, MD  allopurinol (ZYLOPRIM) 100 MG tablet Take 100 mg by mouth daily.   Yes [provider]  brimonidine (ALPHAGAN) 0.15 % ophthalmic solution Place 1 drop into both eyes 2 (two) times daily.   Yes [provider]  Ergocalciferol (VITAMIN D2) 400 units TABS Take 1 tablet by mouth daily.   Yes [provider]  ketotifen (ZADITOR) 0.025 % ophthalmic solution Place 1 drop into both eyes 2 (two) times daily.   Yes [provider]  latanoprost (XALATAN) 0.005 % ophthalmic solution Place 1 drop into both eyes at bedtime.   Yes [provider]  levothyroxine (SYNTHROID, LEVOTHROID) 75 MCG tablet Take 75 mcg by mouth daily before breakfast.   Yes [provider]  Multiple Vitamins-Minerals (PRESERVISION AREDS PO) Take 1 tablet by mouth 2 (two) times daily.   Yes [provider]  traMADol-acetaminophen (ULTRACET) 37.5-325 MG tablet Take 1 tablet by mouth every 6 (six) hours as needed. 03/10/15  Yes Irine Seal, MD  benzonatate (TESSALON) 100 MG capsule Take 1 capsule (100 mg total) by mouth every 8 (eight) hours. Patient not taking: Reported on 11/27/2018 01/05/17   Noemi Chapel, MD  doxycycline (VIBRAMYCIN) 100 MG capsule Take 1 capsule (100 mg total) by mouth 2 (two) times daily. Patient not taking: Reported on 11/27/2018 01/05/17   Noemi Chapel, MD  meclizine (ANTIVERT) 12.5 MG tablet Take 12.5 mg by mouth 2 (two) times daily.    [provider]  Meth-Hyo-M Bl-Na Phos-Ph Sal (URIBEL) 118 MG CAPS Take 1 capsule (118 mg total) by mouth every 8 (eight) hours as needed. Patient not taking: Reported on 01/05/2017 03/11/15   Franchot Gallo, MD  phenazopyridine (PYRIDIUM) 200 MG tablet Take 1 tablet (200 mg total) by mouth 3 (three) times daily as needed for pain. Patient not taking: Reported on 01/05/2017 03/10/15   Irine Seal, MD     Allergies:     Allergies  Allergen Reactions  . Keflex [Cephalexin] Hives     Physical Exam:   Vitals  Blood pressure (!) 144/69, pulse 88, temperature (!) 97.5 F (36.4 C), temperature  source Oral, resp. rate 16, height 5\' 11"  (1.803 m), weight 99.8 kg, SpO2 99 %.   1. General well-developed male, laying in bed, in no apparent distress.  2. Normal affect and insight, Not Suicidal or Homicidal, Awake Alert, Oriented X 3.  3. No F.N deficits, ALL C.Nerves Intact, Strength 5/5 all 4 extremities, Sensation intact all 4 extremities, Plantars down going.  4. Ears and Eyes appear Normal, Conjunctivae clear, PERRLA. Moist Oral Mucosa.  5. Supple Neck, No JVD, No cervical lymphadenopathy appriciated, No Carotid Bruits.  6. Symmetrical Chest wall movement, Good air movement bilaterally, CTAB.  7. RRR, No Gallops, Rubs or Murmurs, No Parasternal Heave.  8. Positive Bowel Sounds, Abdomen Soft, No tenderness, No organomegaly appriciated,No rebound -guarding or rigidity.  9.  No Cyanosis, Normal Skin Turgor, No Skin Rash or Bruise.  +1 edema  10. Good muscle tone,  joints appear normal , no effusions, Normal ROM.  11. No Palpable Lymph Nodes in Neck or Axillae     Data Review:    CBC Recent Labs  Lab 01/10/19 1728  WBC 6.9  HGB 12.4*  HCT 38.8*  PLT 103*  MCV 100.5*  MCH 32.1  MCHC 32.0  RDW 14.4   ------------------------------------------------------------------------------------------------------------------  Chemistries  Recent Labs  Lab 01/10/19 1728  NA 141  K 4.5  CL 110  CO2 21*  GLUCOSE 137*  BUN 36*  CREATININE 1.80*  CALCIUM 9.5   ------------------------------------------------------------------------------------------------------------------ estimated creatinine clearance is 36.1 mL/min (A) (by C-G formula based on SCr of 1.8 mg/dL (H)). ------------------------------------------------------------------------------------------------------------------ No results for input(s): TSH, T4TOTAL, T3FREE, THYROIDAB in the last 72 hours.  Invalid input(s): FREET3  Coagulation profile No results for input(s): INR, PROTIME in the last 168  hours. ------------------------------------------------------------------------------------------------------------------- No results for input(s): DDIMER in the last 72 hours. -------------------------------------------------------------------------------------------------------------------  Cardiac Enzymes No results for input(s): CKMB, TROPONINI, MYOGLOBIN in the last 168 hours.  Invalid input(s): CK ------------------------------------------------------------------------------------------------------------------ No results found for: BNP   ---------------------------------------------------------------------------------------------------------------  Urinalysis    Component Value Date/Time   COLORURINE YELLOW 01/10/2019 1721   APPEARANCEUR HAZY (A) 01/10/2019 1721   LABSPEC 1.017 01/10/2019 1721   PHURINE 5.0 01/10/2019 1721   GLUCOSEU NEGATIVE 01/10/2019 1721   HGBUR NEGATIVE 01/10/2019 1721   BILIRUBINUR NEGATIVE 01/10/2019 1721   [place] 01/10/2019 1721   PROTEINUR NEGATIVE 01/10/2019 1721   UROBILINOGEN 1.0 01/28/2015 0925   NITRITE POSITIVE (A) 01/10/2019 1721   LEUKOCYTESUR TRACE (A) 01/10/2019 1721    ----------------------------------------------------------------------------------------------------------------   Imaging Results:    No results found.  My personal review of EKG: Rhythm NSR, Rate  88 /min, QTc 509 , nonspecific intraventricular conduction delay   Assessment & Plan:    Active Problems:   Syncope   Acute lower UTI   AKI (acute kidney injury) ([place])   Syncope -This is most likely in the setting of dehydration, volume depletion, and orthostasis secondary to UTI, monitor on telemetry, continue with IV fluid, monitor orthostatics closely. -Patient EKG showing nonspecific intraventricular delay, will monitor on telemetry  UTI -Given his allergy to cephalosporin, and history of staph epidermidis UTI in the past resistant to quinolones,  as well prolonged QTC, I will start on Bactrim DS 1 tablet twice daily.  AKI -Baseline creatinine is 1, elevated 1.8 on admission. -Secondary to volume depletion and dehydration, avoid nephrotoxic medication and continue with IV fluids.  Glaucoma -Continue with home medication  Hypothyroidism -Continue with home dose Synthroid   DVT Prophylaxis Heparin -SCDs   AM Labs Ordered, also please review Full Orders  Family Communication: Admission, patients condition and plan of care including tests being ordered have been discussed with the patient who indicate understanding and agree with the plan and Code Status.  Code Status Full  Likely DC to  Home  Condition GUARDED    Consults called: None  Admission status: Observation  Time spent in minutes : 52 minutes   Phillips Climes M.D on 01/10/2019 at 7:10 PM  Between  7am to 7pm - Pager - [phone]. After 7pm go to [email or web address] - password [facility]  [facility] - Office  [phone]

## 2019-01-11 ENCOUNTER — Inpatient Hospital Stay (HOSPITAL_COMMUNITY): Payer: Medicare Other

## 2019-01-11 DIAGNOSIS — Z79899 Other long term (current) drug therapy: Secondary | ICD-10-CM | POA: Diagnosis not present

## 2019-01-11 DIAGNOSIS — R55 Syncope and collapse: Secondary | ICD-10-CM

## 2019-01-11 DIAGNOSIS — Z79891 Long term (current) use of opiate analgesic: Secondary | ICD-10-CM | POA: Diagnosis not present

## 2019-01-11 DIAGNOSIS — E860 Dehydration: Secondary | ICD-10-CM | POA: Diagnosis present

## 2019-01-11 DIAGNOSIS — Z8249 Family history of ischemic heart disease and other diseases of the circulatory system: Secondary | ICD-10-CM | POA: Diagnosis not present

## 2019-01-11 DIAGNOSIS — Z20828 Contact with and (suspected) exposure to other viral communicable diseases: Secondary | ICD-10-CM | POA: Diagnosis present

## 2019-01-11 DIAGNOSIS — N179 Acute kidney failure, unspecified: Secondary | ICD-10-CM | POA: Diagnosis present

## 2019-01-11 DIAGNOSIS — H3530 Unspecified macular degeneration: Secondary | ICD-10-CM | POA: Diagnosis present

## 2019-01-11 DIAGNOSIS — Z87891 Personal history of nicotine dependence: Secondary | ICD-10-CM | POA: Diagnosis not present

## 2019-01-11 DIAGNOSIS — Z833 Family history of diabetes mellitus: Secondary | ICD-10-CM | POA: Diagnosis not present

## 2019-01-11 DIAGNOSIS — E039 Hypothyroidism, unspecified: Secondary | ICD-10-CM | POA: Diagnosis present

## 2019-01-11 DIAGNOSIS — Z881 Allergy status to other antibiotic agents status: Secondary | ICD-10-CM | POA: Diagnosis not present

## 2019-01-11 DIAGNOSIS — N390 Urinary tract infection, site not specified: Secondary | ICD-10-CM | POA: Diagnosis not present

## 2019-01-11 DIAGNOSIS — Z79890 Hormone replacement therapy (postmenopausal): Secondary | ICD-10-CM | POA: Diagnosis not present

## 2019-01-11 DIAGNOSIS — H409 Unspecified glaucoma: Secondary | ICD-10-CM | POA: Diagnosis present

## 2019-01-11 DIAGNOSIS — N3000 Acute cystitis without hematuria: Secondary | ICD-10-CM | POA: Diagnosis present

## 2019-01-11 LAB — BASIC METABOLIC PANEL
Anion gap: 6 (ref 5–15)
BUN: 33 mg/dL — ABNORMAL HIGH (ref 8–23)
CO2: 22 mmol/L (ref 22–32)
Calcium: 8.8 mg/dL — ABNORMAL LOW (ref 8.9–10.3)
Chloride: 113 mmol/L — ABNORMAL HIGH (ref 98–111)
Creatinine, Ser: 1.36 mg/dL — ABNORMAL HIGH (ref 0.61–1.24)
GFR calc Af Amer: 55 mL/min — ABNORMAL LOW (ref >60)
GFR calc non Af Amer: 47 mL/min — ABNORMAL LOW (ref >60)
Glucose, Bld: 84 mg/dL (ref 70–99)
Potassium: 4.1 mmol/L (ref 3.5–5.1)
Sodium: 141 mmol/L (ref 135–145)

## 2019-01-11 LAB — CBC
HCT: 34.6 % — ABNORMAL LOW (ref 39.0–52.0)
Hemoglobin: 11 g/dL — ABNORMAL LOW (ref 13.0–17.0)
MCH: 31.8 pg (ref 26.0–34.0)
MCHC: 31.8 g/dL (ref 30.0–36.0)
MCV: 100 fL (ref 80.0–100.0)
Platelets: 95 10*3/uL — ABNORMAL LOW (ref 150–400)
RBC: 3.46 MIL/uL — ABNORMAL LOW (ref 4.22–5.81)
RDW: 14.3 % (ref 11.5–15.5)
WBC: 6 10*3/uL (ref 4.0–10.5)
nRBC: 0 % (ref 0.0–0.2)

## 2019-01-11 LAB — SARS CORONAVIRUS 2 (TAT 6-24 HRS): SARS Coronavirus 2: NEGATIVE

## 2019-01-11 LAB — ECHOCARDIOGRAM COMPLETE
Height: 71 in
Weight: 3432.12 oz

## 2019-01-11 NOTE — Progress Notes (Signed)
*  PRELIMINARY RESULTS* Echocardiogram 2D Echocardiogram has been performed.  Leavy Cella 01/11/2019, 10:09 AM

## 2019-01-11 NOTE — Progress Notes (Signed)
**Note De-Identified Gabriel Spencer Obfuscation** EKG completed and placed in patient chart. RN aware

## 2019-01-11 NOTE — Progress Notes (Signed)
[facility]  PROGRESS NOTE  Gabriel Spencer  [id number]  DOB: 01/22/1932  DOA: 01/10/2019 PCP: Doree Albee, MD   Brief Admission Hx: 85 y.o. male, with past medical history of macular degeneration, glaucoma, preretinal detachment on the right repaired many years ago, hypothyroidism, bladder cancer status post transurethral resection, history of recurrent dizziness spell, hypothyroidism, presents to ED after syncopal episode.    MDM/Assessment & Plan:   1. Orthostatic syncope - Pt was clearly volume depleted and orthostatic on arrival. Continue gentle hydration. Follow up Echo results.  Treat UTI.  PT evaluation.  2. UTI - treated with bactrim DS. Follow Culture.  3. AKI - prerenal, treating with IV fluids.  Creatinine trending down.  4. Hypothryroidism - stable, resumed home levothyroxine. 5. Glaucoma - resumed home medications.    DVT prophylaxis: SCDs Code Status: Full  Family Communication:  Disposition Plan: continue inpatient management   Consultants:    Procedures:    Antimicrobials:  Bactrim DS   Subjective: Pt reports that he feels weak.  He is urinating.  Denies dysuria.   Objective: Vitals:   01/10/19 2013 01/10/19 2015 01/11/19 0629 01/11/19 1017  BP:  (!) 161/71 (!) 120/55   Pulse:  95 77   Resp:  16    Temp:  98 F (36.7 C) 97.8 F (36.6 C)   TempSrc:  Oral Oral   SpO2:  99% 99% 98%  Weight: 97.3 kg     Height: 5\' 11"  (1.803 m)       Intake/Output Summary (Last 24 hours) at 01/11/2019 1043 Last data filed at 01/11/2019 0500 Gross per 24 hour  Intake 1691.54 ml  Output 620 ml  Net 1071.54 ml   Filed Weights   01/10/19 1647 01/10/19 2013  Weight: 99.8 kg 97.3 kg    REVIEW OF SYSTEMS  As per history otherwise all reviewed and reported negative  Exam:  General exam: chronically ill appearing male, awake, alert, NAD.  Respiratory system:  No increased work of breathing. Cardiovascular system: normal S1 & S2 heard.   Gastrointestinal system: Abdomen is nondistended, soft and nontender. Normal bowel sounds heard. Central nervous system: Alert and oriented. No focal neurological deficits. Extremities: no cyanosis or clubbing.   Data Reviewed: Basic Metabolic Panel: Recent Labs  Lab 01/10/19 1728 01/11/19 0701  NA 141 141  K 4.5 4.1  CL 110 113*  CO2 21* 22  GLUCOSE 137* 84  BUN 36* 33*  CREATININE 1.80* 1.36*  CALCIUM 9.5 8.8*   Liver Function Tests: No results for input(s): AST, ALT, ALKPHOS, BILITOT, PROT, ALBUMIN in the last 168 hours. No results for input(s): LIPASE, AMYLASE in the last 168 hours. No results for input(s): AMMONIA in the last 168 hours. CBC: Recent Labs  Lab 01/10/19 1728 01/11/19 0701  WBC 6.9 6.0  HGB 12.4* 11.0*  HCT 38.8* 34.6*  MCV 100.5* 100.0  PLT 103* 95*   Cardiac Enzymes: No results for input(s): CKTOTAL, CKMB, CKMBINDEX, TROPONINI in the last 168 hours. CBG (last 3)  No results for input(s): GLUCAP in the last 72 hours. No results found for this or any previous visit (from the past 240 hour(s)).   Studies: No results found.   Scheduled Meds: . allopurinol  100 mg Oral Daily  . brimonidine  1 drop Both Eyes BID  . heparin  5,000 Units Subcutaneous Q8H  . ketotifen  1 drop Both Eyes BID  . latanoprost  1 drop Both Eyes QHS  . levothyroxine  75 mcg Oral QAC breakfast  . meclizine  12.5 mg Oral BID  . sulfamethoxazole-trimethoprim  1 tablet Oral Q12H   Continuous Infusions: . sodium chloride 75 mL/hr at 01/10/19 1938    Active Problems:   Syncope   Acute lower UTI   AKI (acute kidney injury) ([place])   ARF (acute renal failure) ([place])  Time spent:   Irwin Brakeman, MD [facility] 01/11/2019, 10:43 AM    LOS: 0 days  How to contact the [facility] Attending or Consulting provider [place] or covering provider during after hours [place], for this patient?  1. Check the care team in [facility] and look for a) attending/consulting [facility] provider  listed and b) the [facility] team listed 2. Log into [email or web address] and use [place]'s universal password to access. If you do not have the password, please contact the hospital operator. 3. Locate the [facility] provider you are looking for under [facility] and page to a number that you can be directly reached. 4. If you still have difficulty reaching the provider, please page the [facility] (Director on Call) for the Hospitalists listed on amion for assistance.

## 2019-01-12 DIAGNOSIS — N390 Urinary tract infection, site not specified: Secondary | ICD-10-CM | POA: Diagnosis not present

## 2019-01-12 DIAGNOSIS — N179 Acute kidney failure, unspecified: Secondary | ICD-10-CM | POA: Diagnosis not present

## 2019-01-12 LAB — BASIC METABOLIC PANEL
Anion gap: 7 (ref 5–15)
BUN: 26 mg/dL — ABNORMAL HIGH (ref 8–23)
CO2: 21 mmol/L — ABNORMAL LOW (ref 22–32)
Calcium: 8.4 mg/dL — ABNORMAL LOW (ref 8.9–10.3)
Chloride: 109 mmol/L (ref 98–111)
Creatinine, Ser: 1.32 mg/dL — ABNORMAL HIGH (ref 0.61–1.24)
GFR calc Af Amer: 57 mL/min — ABNORMAL LOW (ref >60)
GFR calc non Af Amer: 49 mL/min — ABNORMAL LOW (ref >60)
Glucose, Bld: 87 mg/dL (ref 70–99)
Potassium: 4.1 mmol/L (ref 3.5–5.1)
Sodium: 137 mmol/L (ref 135–145)

## 2019-01-12 LAB — URINE CULTURE

## 2019-01-12 MED ORDER — SULFAMETHOXAZOLE-TRIMETHOPRIM 800-160 MG PO TABS: 1.0000 | ORAL_TABLET | Freq: Two times a day (BID) | ORAL | 0 refills | Status: AC

## 2019-01-12 NOTE — Discharge Summary (Signed)
Physician Discharge Summary  Gabriel Spencer [id number] DOB: 1931/09/14 DOA: 01/10/2019  PCP: Doree Albee, MD  Admit date: 01/10/2019 Discharge date: 01/12/2019  Admitted From:  Home  Disposition: Home   Recommendations for Outpatient Follow-up:  1. Follow up with PCP in 1 weeks  Discharge Condition: STABLE   CODE STATUS: FULL    Brief Hospitalization Summary: Please see all hospital notes, images, labs for full details of the hospitalization. Dr. Graciela Husbands [id number] [place]  is a 85 y.o. male, with past medical history of macular degeneration, glaucoma, preretinal detachment on the right repaired many years ago, hypothyroidism, bladder cancer status post transurethral resection, history of recurrent dizziness spell, hypothyroidism, presents to ED secondary to syncopal episode, patient report he was working on his lawnmower today, he bent over to put in new bag, and upon standing he felt dizzy, lightheaded, and he passed out, reports he did lose consciousness, for a few seconds, wife called 911, by then patient back to baseline, he denies any nausea, vomiting, diaphoresis, chest pain, fever, hemoptysis, recent travel, focal deficits, tingling or numbness. - in ED he was noted to be mildly orthostatic, sitting 135/53, standing 109/42, with worsening renal function, creatinine at 1.8, he has positive urine analysis, started on IV fluid and I was called to admit.  Brief Admission Hx: 85 y.o.male,with past medical history of macular degeneration, glaucoma, preretinal detachment on the right repaired many years ago, hypothyroidism, bladder cancer status post transurethral resection, history of recurrent dizziness spell, hypothyroidism, presents to ED after syncopal episode.    MDM/Assessment & Plan:   1. Orthostatic syncope - Pt was clearly volume depleted and orthostatic on arrival.  He was treated with gentle hydration and is feeling much better now.  He was evaluated by physical  therapy and he did very well and no follow-up was recommended. His UTI was treated.  A 2D echocardiogram was done with results noted below. 2. UTI - treated with bactrim DS. Follow Culture.  3. AKI - prerenal, treating with IV fluids.  Creatinine trending down.  4. Hypothryroidism - stable, resumed home levothyroxine. 5. Glaucoma - resumed home medications.   DVT prophylaxis: SCDs Code Status: Full  Family Communication:  Disposition Plan  2D Echocardiogram IMPRESSIONS  1. Left ventricular ejection fraction, by visual estimation, is 55 to 60%. The left ventricle has normal function. Normal left ventricular size. There is mildly increased left ventricular hypertrophy.  2. Global right ventricle has normal systolic function.The right ventricular size is normal. No increase in right ventricular wall thickness.  3. Left atrial size was mildly dilated.  4. Right atrial size was normal.  5. Mild aortic valve annular calcification.  6. Moderate mitral annular calcification.  7. The mitral valve is grossly normal. Trace mitral valve regurgitation.  8. The tricuspid valve is grossly normal. Tricuspid valve regurgitation is trivial.  9. The aortic valve is tricuspid Aortic valve regurgitation was not visualized by color flow Doppler. 10. The pulmonic valve was grossly normal. Pulmonic valve regurgitation is trivial by color flow Doppler. 11. Moderately elevated pulmonary artery systolic pressure. 12. The inferior vena cava is dilated in size with >50% respiratory variability, suggesting right atrial pressure of 8 mmHg.   Discharge Diagnoses:  Active Problems:   Syncope   Acute lower UTI   AKI (acute kidney injury) ([place])   ARF (acute renal failure) ([place])   Discharge Instructions:  Allergies as of 01/12/2019      Reactions   Keflex [cephalexin] Hives  Medication List    STOP taking these medications   benzonatate 100 MG capsule Commonly known as: TESSALON   doxycycline 100  MG capsule Commonly known as: VIBRAMYCIN   phenazopyridine 200 MG tablet Commonly known as: Pyridium   Uribel 118 MG Caps     TAKE these medications   albuterol 108 (90 Base) MCG/ACT inhaler Commonly known as: VENTOLIN HFA Inhale 2 puffs into the lungs every 4 (four) hours as needed for wheezing or shortness of breath.   allopurinol 100 MG tablet Commonly known as: ZYLOPRIM Take 100 mg by mouth daily.   brimonidine 0.15 % ophthalmic solution Commonly known as: ALPHAGAN Place 1 drop into both eyes 2 (two) times daily.   ketotifen 0.025 % ophthalmic solution Commonly known as: ZADITOR Place 1 drop into both eyes 2 (two) times daily.   latanoprost 0.005 % ophthalmic solution Commonly known as: XALATAN Place 1 drop into both eyes at bedtime.   levothyroxine 75 MCG tablet Commonly known as: SYNTHROID Take 75 mcg by mouth daily before breakfast.   meclizine 12.5 MG tablet Commonly known as: ANTIVERT Take 12.5 mg by mouth 2 (two) times daily.   PRESERVISION AREDS PO Take 1 tablet by mouth 2 (two) times daily.   sulfamethoxazole-trimethoprim 800-160 MG tablet Commonly known as: BACTRIM DS Take 1 tablet by mouth every 12 (twelve) hours for 2 days.   traMADol-acetaminophen 37.5-325 MG tablet Commonly known as: Ultracet Take 1 tablet by mouth every 6 (six) hours as needed.   Vitamin D2 10 MCG (400 UNIT) Tabs Take 1 tablet by mouth daily.      Follow-up Information    Doree Albee, MD. Schedule an appointment as soon as possible for a visit in 1 week(s).   Specialty: Internal Medicine Why: Hospital follow-up Contact information: [address] [address] [phone]          Allergies  Allergen Reactions  . Keflex [Cephalexin] Hives   Allergies as of 01/12/2019      Reactions   Keflex [cephalexin] Hives      Medication List    STOP taking these medications   benzonatate 100 MG capsule Commonly known as: TESSALON    doxycycline 100 MG capsule Commonly known as: VIBRAMYCIN   phenazopyridine 200 MG tablet Commonly known as: Pyridium   Uribel 118 MG Caps     TAKE these medications   albuterol 108 (90 Base) MCG/ACT inhaler Commonly known as: VENTOLIN HFA Inhale 2 puffs into the lungs every 4 (four) hours as needed for wheezing or shortness of breath.   allopurinol 100 MG tablet Commonly known as: ZYLOPRIM Take 100 mg by mouth daily.   brimonidine 0.15 % ophthalmic solution Commonly known as: ALPHAGAN Place 1 drop into both eyes 2 (two) times daily.   ketotifen 0.025 % ophthalmic solution Commonly known as: ZADITOR Place 1 drop into both eyes 2 (two) times daily.   latanoprost 0.005 % ophthalmic solution Commonly known as: XALATAN Place 1 drop into both eyes at bedtime.   levothyroxine 75 MCG tablet Commonly known as: SYNTHROID Take 75 mcg by mouth daily before breakfast.   meclizine 12.5 MG tablet Commonly known as: ANTIVERT Take 12.5 mg by mouth 2 (two) times daily.   PRESERVISION AREDS PO Take 1 tablet by mouth 2 (two) times daily.   sulfamethoxazole-trimethoprim 800-160 MG tablet Commonly known as: BACTRIM DS Take 1 tablet by mouth every 12 (twelve) hours for 2 days.   traMADol-acetaminophen 37.5-325 MG tablet Commonly  known as: Ultracet Take 1 tablet by mouth every 6 (six) hours as needed.   Vitamin D2 10 MCG (400 UNIT) Tabs Take 1 tablet by mouth daily.       Procedures/Studies:  No results found.   Subjective: Pt reports that he feels much better.  He ambulated with PT and he did very well.  He wants to go home.   Discharge Exam: Vitals:   01/11/19 2151 01/12/19 0519  BP: 115/63 123/60  Pulse: 69 67  Resp:  17  Temp: 98.1 F (36.7 C) 97.7 F (36.5 C)  SpO2: 97% 95%   Vitals:   01/11/19 1017 01/11/19 1509 01/11/19 2151 01/12/19 0519  BP:  (!) 110/50 115/63 123/60  Pulse:  70 69 67  Resp:  18  17  Temp:  98 F (36.7 C) 98.1 F (36.7 C) 97.7 F  (36.5 C)  TempSrc:  Oral Oral Oral  SpO2: 98% 97% 97% 95%  Weight:      Height:       General: Pt is alert, awake, not in acute distress Cardiovascular: RRR, S1/S2 +, no rubs, no gallops Respiratory: CTA bilaterally, no wheezing, no rhonchi Abdominal: Soft, NT, ND, bowel sounds + Extremities: no edema, no cyanosis   The results of significant diagnostics from this hospitalization (including imaging, microbiology, ancillary and laboratory) are listed below for reference.     Microbiology: Recent Results (from the past 240 hour(s))  Urine culture     Status: Abnormal   Collection Time: 01/10/19  5:21 PM   Specimen: Urine, Clean Catch  Result Value Ref Range Status   Specimen Description   Final    URINE, CLEAN CATCH Performed at [facility], [address] [place], [place] [address]    Special Requests   Final    NONE Performed at [facility], [address], [place], [place] [address]    Culture MULTIPLE SPECIES PRESENT, SUGGEST RECOLLECTION (A)  Final   Report Status 01/12/2019 FINAL  Final  SARS CORONAVIRUS 2 (TAT 6-24 HRS) Nasopharyngeal Nasopharyngeal Swab     Status: None   Collection Time: 01/10/19  7:00 PM   Specimen: Nasopharyngeal Swab  Result Value Ref Range Status   SARS Coronavirus 2 NEGATIVE NEGATIVE Final    Comment: (NOTE) SARS-CoV-2 target nucleic acids are NOT DETECTED. The SARS-CoV-2 RNA is generally detectable in upper and lower respiratory specimens during the acute phase of infection. Negative results do not preclude SARS-CoV-2 infection, do not rule out co-infections with other pathogens, and should not be used as the sole basis for treatment or other patient management decisions. Negative results must be combined with clinical observations, patient history, and epidemiological information. The expected result is Negative. Fact Sheet for Patients: SugarRoll.be Fact Sheet for Healthcare  Providers: https://www.woods-mathews.com/ This test is not yet approved or cleared by the [place] FDA and  has been authorized for detection and/or diagnosis of SARS-CoV-2 by FDA under an Emergency Use Authorization (EUA). This EUA will remain  in effect (meaning this test can be used) for the duration of the COVID-19 declaration under Section 56 4(b)(1) of the Act, 21 U.S.C. section 360bbb-3(b)(1), unless the authorization is terminated or revoked sooner. Performed at [facility], [place] [address], [place], [place] [address]      Labs: BNP (last 3 results) No results for input(s): BNP in the last 8760 hours. Basic Metabolic Panel: Recent Labs  Lab 01/10/19 1728 01/11/19 0701 01/12/19 0826  NA 141 141 137  K  4.5 4.1 4.1  CL 110 113* 109  CO2 21* 22 21*  GLUCOSE 137* 84 87  BUN 36* 33* 26*  CREATININE 1.80* 1.36* 1.32*  CALCIUM 9.5 8.8* 8.4*   Liver Function Tests: No results for input(s): AST, ALT, ALKPHOS, BILITOT, PROT, ALBUMIN in the last 168 hours. No results for input(s): LIPASE, AMYLASE in the last 168 hours. No results for input(s): AMMONIA in the last 168 hours. CBC: Recent Labs  Lab 01/10/19 1728 01/11/19 0701  WBC 6.9 6.0  HGB 12.4* 11.0*  HCT 38.8* 34.6*  MCV 100.5* 100.0  PLT 103* 95*   Cardiac Enzymes: No results for input(s): CKTOTAL, CKMB, CKMBINDEX, TROPONINI in the last 168 hours. BNP: Invalid input(s): POCBNP CBG: No results for input(s): GLUCAP in the last 168 hours. D-Dimer No results for input(s): DDIMER in the last 72 hours. Hgb A1c No results for input(s): HGBA1C in the last 72 hours. Lipid Profile No results for input(s): CHOL, HDL, LDLCALC, TRIG, CHOLHDL, LDLDIRECT in the last 72 hours. Thyroid function studies No results for input(s): TSH, T4TOTAL, T3FREE, THYROIDAB in the last 72 hours.  Invalid input(s): FREET3 Anemia work up No results for input(s): VITAMINB12, FOLATE, FERRITIN, TIBC, IRON, RETICCTPCT  in the last 72 hours. Urinalysis    Component Value Date/Time   COLORURINE YELLOW 01/10/2019 1721   APPEARANCEUR HAZY (A) 01/10/2019 1721   LABSPEC 1.017 01/10/2019 1721   PHURINE 5.0 01/10/2019 1721   GLUCOSEU NEGATIVE 01/10/2019 1721   HGBUR NEGATIVE 01/10/2019 1721   BILIRUBINUR NEGATIVE 01/10/2019 1721   KETONESUR NEGATIVE 01/10/2019 1721   PROTEINUR NEGATIVE 01/10/2019 1721   UROBILINOGEN 1.0 01/28/2015 0925   NITRITE POSITIVE (A) 01/10/2019 1721   LEUKOCYTESUR TRACE (A) 01/10/2019 1721   Sepsis Labs Invalid input(s): PROCALCITONIN,  WBC,  LACTICIDVEN Microbiology Recent Results (from the past 240 hour(s))  Urine culture     Status: Abnormal   Collection Time: 01/10/19  5:21 PM   Specimen: Urine, Clean Catch  Result Value Ref Range Status   Specimen Description   Final    URINE, CLEAN CATCH Performed at [facility], [address] [place], [place] [address]    Special Requests   Final    NONE Performed at [facility], [address], [place], [place] [address]    Culture MULTIPLE SPECIES PRESENT, SUGGEST RECOLLECTION (A)  Final   Report Status 01/12/2019 FINAL  Final  SARS CORONAVIRUS 2 (TAT 6-24 HRS) Nasopharyngeal Nasopharyngeal Swab     Status: None   Collection Time: 01/10/19  7:00 PM   Specimen: Nasopharyngeal Swab  Result Value Ref Range Status   SARS Coronavirus 2 NEGATIVE NEGATIVE Final    Comment: (NOTE) SARS-CoV-2 target nucleic acids are NOT DETECTED. The SARS-CoV-2 RNA is generally detectable in upper and lower respiratory specimens during the acute phase of infection. Negative results do not preclude SARS-CoV-2 infection, do not rule out co-infections with other pathogens, and should not be used as the sole basis for treatment or other patient management decisions. Negative results must be combined with clinical observations, patient history, and epidemiological information. The expected result is Negative. Fact Sheet for  Patients: SugarRoll.be Fact Sheet for Healthcare Providers: https://www.woods-mathews.com/ This test is not yet approved or cleared by the [place] FDA and  has been authorized for detection and/or diagnosis of SARS-CoV-2 by FDA under an Emergency Use Authorization (EUA). This EUA will remain  in effect (meaning this test can be used) for the duration of the COVID-19 declaration under Section 56  4(b)(1) of the Act, 21 U.S.C. section 360bbb-3(b)(1), unless the authorization is terminated or revoked sooner. Performed at [facility], [place] [address], [place], [place] [address]    Time coordinating discharge: 33 minutes   SIGNED:  Irwin Brakeman, MD  [facility] 01/12/2019, 12:22 PM How to contact the [facility] Attending or Consulting provider [place] or covering provider during after hours [place], for this patient?  1. Check the care team in [facility] and look for a) attending/consulting [facility] provider listed and b) the [facility] team listed 2. Log into [email or web address] and use [place]'s universal password to access. If you do not have the password, please contact the hospital operator. 3. Locate the [facility] provider you are looking for under [facility] and page to a number that you can be directly reached. 4. If you still have difficulty reaching the provider, please page the [facility] (Director on Call) for the Hospitalists listed on amion for assistance.

## 2019-01-12 NOTE — Evaluation (Signed)
Physical Therapy Evaluation Patient Details Name: Gabriel Spencer MRN: [id number] DOB: Apr 05, 1931 Today's Date: 01/12/2019   History of Present Illness  Gabriel Spencer  is a 85 y.o. male, with past medical history of macular degeneration, glaucoma, preretinal detachment on the right repaired many years ago, hypothyroidism, bladder cancer status post transurethral resection, history of recurrent dizziness spell, hypothyroidism, presents to ED secondary to syncopal episode, patient report he was working on his lawnmower today, he bent over to put in new bag, and upon standing he felt dizzy, lightheaded, and he passed out, reports he did lose consciousness, for a few seconds, wife called 911, by then patient back to baseline, he denies any nausea, vomiting, diaphoresis, chest pain, fever, hemoptysis, recent travel, focal deficits, tingling or numbness.- in ED he was noted to be mildly orthostatic, sitting 135/53, standing 109/42, with worsening renal function, creatinine at 1.8, he has positive urine analysis, started on IV fluid and I was called to admit.    Clinical Impression  Patient limited for functional mobility as stated below secondary to BLE weakness, fatigue and poor standing balance. Patient able to complete transfers and bed mobility slowly but safely and remains most limited with activity tolerance as demonstrated during ambulation. Orthostatics were assessed as seated: 123/64, standing: 128/54; patient denies symptoms. Patient ambulates slowly with wide base of support without use of AD and requires UE support on wall to maintain balance. Patient ambulates with moderately improved gait pattern with RW but continues to ambulate slowly and requires some verbal cueing to ambulate with walker close. Patient left in chair - RN notified.  Patient will benefit from continued physical therapy in hospital and recommended venue below to increase strength, balance, endurance for safe ADLs and gait.     Follow  Up Recommendations No PT follow up    Equipment Recommendations  Rolling walker with 5" wheels    Recommendations for Other Services       Precautions / Restrictions Precautions Precautions: Fall Restrictions Weight Bearing Restrictions: No      Mobility  Bed Mobility Overal bed mobility: Needs Assistance Bed Mobility: Supine to Sit     Supine to sit: Modified independent (Device/Increase time)     General bed mobility comments: transition to seated at bedside, slow,  Transfers Overall transfer level: Modified independent Equipment used: None Transfers: Sit to/from Stand Sit to Stand: Modified independent (Device/Increase time)         General transfer comment: blood pressure after transition to seated: 123/64; after transition to standing 128/54; slow, labored, slightly unsteady upon standing; denies dizziness/symptoms  Ambulation/Gait Ambulation/Gait assistance: Modified independent (Device/Increase time);Min assist Gait Distance (Feet): 40 Feet Assistive device: None;Rolling walker (2 wheeled) Gait Pattern/deviations: Wide base of support;Decreased step length - right;Decreased step length - left;Step-through pattern;Decreased stride length Gait velocity: decreased   General Gait Details: without AD: ambulates with wide base of support with decresed step and stride length, slow, labored, used intermittent wall for support with UE; with RW: gait improves, decreased wide LE base of support, stride/step length remains decreased, slow, labored, becomes mildly SOB and minimal weezing  Stairs            Wheelchair Mobility    Modified Rankin (Stroke Patients Only)       Balance Overall balance assessment: Needs assistance Sitting-balance support: Feet supported;No upper extremity supported Sitting balance-Leahy Scale: Fair Sitting balance - Comments: at bedside     Standing balance-Leahy Scale: Poor Standing balance comment: requires use of RW/Wall  Pertinent Vitals/Pain Pain Assessment: No/denies pain    Home Living Family/patient expects to be discharged to:: Private residence Living Arrangements: Spouse/significant other Available Help at Discharge: Family Type of Home: House Home Access: Stairs to enter Entrance Stairs-Rails: Psychiatric nurse of Steps: 5 Home Layout: Two level Home Equipment: Grab bars - tub/shower      Prior Function Level of Independence: Needs assistance   Gait / Transfers Assistance Needed: 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  ADL's / Homemaking Assistance Needed: wife assists        Hand Dominance        Extremity/Trunk Assessment   Upper Extremity Assessment Upper Extremity Assessment: Generalized weakness    Lower Extremity Assessment Lower Extremity Assessment: Generalized weakness       Communication   Communication: No difficulties  Cognition Arousal/Alertness: Awake/alert Behavior During Therapy: WFL for tasks assessed/performed Overall Cognitive Status: Within Functional Limits for tasks assessed                                        General Comments      Exercises     Assessment/Plan    PT Assessment Patient needs continued PT services  PT Problem List Decreased strength;Decreased activity tolerance;Decreased balance;Decreased mobility;Decreased knowledge of use of DME;Decreased safety awareness       PT Treatment Interventions DME instruction;Gait training;Functional mobility training;Stair training;Therapeutic activities;Therapeutic exercise;Balance training;Patient/family education    PT Goals (Current goals can be found in the Care Plan section)  Acute Rehab PT Goals Patient Stated Goal: return home with wife PT Goal Formulation: With patient Time For Goal Achievement: 01/26/19 Potential to Achieve Goals: Good    Frequency Min 3X/week   Barriers to discharge         Co-evaluation               AM-PAC PT "6 Clicks" Mobility  Outcome Measure Help needed turning from your back to your side while in a flat bed without using bedrails?: None Help needed moving from lying on your back to sitting on the side of a flat bed without using bedrails?: None Help needed moving to and from a bed to a chair (including a wheelchair)?: A Little Help needed standing up from a chair using your arms (e.g., wheelchair or bedside chair)?: None Help needed to walk in hospital room?: A Little Help needed climbing 3-5 steps with a railing? : A Lot 6 Click Score: 20    End of Session Equipment Utilized During Treatment: Gait belt   Patient left: in chair;with call bell/phone within reach;with chair alarm set Nurse Communication: Mobility status PT Visit Diagnosis: Unsteadiness on feet (R26.81);Muscle weakness (generalized) (M62.81);Other abnormalities of gait and mobility (R26.89)    Time: [id number] PT Time Calculation (min) (ACUTE ONLY): 33 min   Charges:   PT Evaluation $PT Eval Moderate Complexity: 1 Mod PT Treatments $Therapeutic Activity: 23-37 mins        11:44 AM, 01/12/19 Mearl Latin PT, DPT Physical Therapist at [facility]

## 2019-01-12 NOTE — Discharge Instructions (Signed)
Syncope Syncope is when you pass out (faint) for a short time. It is caused by a sudden decrease in blood flow to the brain. Signs that you may be about to pass out include:  Feeling dizzy or light-headed.  Feeling sick to your stomach (nauseous).  Seeing all white or all black.  Having cold, clammy skin. If you pass out, get help right away. Call your local emergency services (911 in the [place] Do not drive yourself to the hospital. Follow these instructions at home: Watch for any changes in your symptoms. Take these actions to stay safe and help with your symptoms: Lifestyle  Do not drive, use machinery, or play sports until your doctor says it is okay.  Do not drink alcohol.  Do not use any products that contain nicotine or tobacco, such as cigarettes and e-cigarettes. If you need help quitting, ask your doctor.  Drink enough fluid to keep your pee (urine) pale yellow. General instructions  Take over-the-counter and prescription medicines only as told by your doctor.  If you are taking blood pressure or heart medicine, sit up and stand up slowly. Spend a few minutes getting ready to sit and then stand. This can help you feel less dizzy.  Have someone stay with you until you feel stable.  If you start to feel like you might pass out, lie down right away and raise (elevate) your feet above the level of your heart. Breathe deeply and steadily. Wait until all of the symptoms are gone.  Keep all follow-up visits as told by your doctor. This is important. Get help right away if:  You have a very bad headache.  You pass out once or more than once.  You have pain in your chest, belly, or back.  You have a very fast or uneven heartbeat (palpitations).  It hurts to breathe.  You are bleeding from your mouth or your bottom (rectum).  You have black or tarry poop (stool).  You have jerky movements that you cannot control (seizure).  You are confused.  You have trouble  walking.  You are very weak.  You have vision problems. These symptoms may be an emergency. Do not wait to see if the symptoms will go away. Get medical help right away. Call your local emergency services (911 in the [place] Do not drive yourself to the hospital. Summary  Syncope is when you pass out (faint) for a short time. It is caused by a sudden decrease in blood flow to the brain.  Signs that you may be about to faint include feeling dizzy, light-headed, or sick to your stomach, seeing all white or all black, or having cold, clammy skin.  If you start to feel like you might pass out, lie down right away and raise (elevate) your feet above the level of your heart. Breathe deeply and steadily. Wait until all of the symptoms are gone. This information is not intended to replace advice given to you by your health care provider. Make sure you discuss any questions you have with your health care provider. Document Released: 08/29/2007 Document Revised: 04/24/2017 Document Reviewed: 04/24/2017 Elsevier Patient Education  2020 [place].   IMPORTANT INFORMATION: PAY CLOSE ATTENTION   PHYSICIAN DISCHARGE INSTRUCTIONS  Follow with Primary care provider  Doree Albee, MD  and other consultants as instructed by your Hospitalist Physician  [place] IF SYMPTOMS COME BACK, WORSEN OR NEW PROBLEM DEVELOPS   Please note: You were  cared for by a hospitalist during your hospital stay. Every effort will be made to forward records to your primary care provider.  You can request that your primary care provider send for your hospital records if they have not received them.  Once you are discharged, your primary care physician will handle any further medical issues. Please note that NO REFILLS for any discharge medications will be authorized once you are discharged, as it is imperative that you return to your primary care physician (or establish a relationship  with a primary care physician if you do not have one) for your post hospital discharge needs so that they can reassess your need for medications and monitor your lab values.  Please get a complete blood count and chemistry panel checked by your Primary MD at your next visit, and again as instructed by your Primary MD.  Get Medicines reviewed and adjusted: Please take all your medications with you for your next visit with your Primary MD  Laboratory/radiological data: Please request your Primary MD to go over all hospital tests and procedure/radiological results at the follow up, please ask your primary care provider to get all Hospital records sent to his/her office.  In some cases, they will be blood work, cultures and biopsy results pending at the time of your discharge. Please request that your primary care provider follow up on these results.  If you are diabetic, please bring your blood sugar readings with you to your follow up appointment with primary care.    Please call and make your follow up appointments as soon as possible.    Also Note the following: If you experience worsening of your admission symptoms, develop shortness of breath, life threatening emergency, suicidal or homicidal thoughts you must seek medical attention immediately by calling 911 or calling your MD immediately  if symptoms less severe.  You must read complete instructions/literature along with all the possible adverse reactions/side effects for all the Medicines you take and that have been prescribed to you. Take any new Medicines after you have completely understood and accpet all the possible adverse reactions/side effects.   Do not drive when taking Pain medications or sleeping medications (Benzodiazepines)  Do not take more than prescribed Pain, Sleep and Anxiety Medications. It is not advisable to combine anxiety,sleep and pain medications without talking with your primary care practitioner  Special  Instructions: If you have smoked or chewed Tobacco  in the last 2 yrs please stop smoking, stop any regular Alcohol  and or any Recreational drug use.  Wear Seat belts while driving.  Do not drive if taking any narcotic, mind altering or controlled substances or recreational drugs or alcohol.         Acute Kidney Injury, Adult  Acute kidney injury is a sudden worsening of kidney function. The kidneys are organs that have several jobs. They filter the blood to remove waste products and extra fluid. They also maintain a healthy balance of minerals and hormones in the body, which helps control blood pressure and keep bones strong. With this condition, your kidneys do not do their jobs as well as they should. This condition ranges from mild to severe. Over time it may develop into long-lasting (chronic) kidney disease. Early detection and treatment may prevent acute kidney injury from developing into a chronic condition. What are the causes? Common causes of this condition include:  A problem with blood flow to the kidneys. This may be caused by: ? Low blood pressure (hypotension) or  shock. ? Blood loss. ? Heart and blood vessel (cardiovascular) disease. ? Severe burns. ? Liver disease.  Direct damage to the kidneys. This may be caused by: ? Certain medicines. ? A kidney infection. ? Poisoning. ? Being around or in contact with toxic substances. ? A surgical wound. ? A hard, direct hit to the kidney area.  A sudden blockage of urine flow. This may be caused by: ? Cancer. ? Kidney stones. ? An enlarged prostate in males. What are the signs or symptoms? Symptoms of this condition may not be obvious until the condition becomes severe. Symptoms of this condition can include:  Tiredness (lethargy), or difficulty staying awake.  Nausea or vomiting.  Swelling (edema) of the face, legs, ankles, or feet.  Problems with urination, such as: ? Abdominal pain, or pain along the side of  your stomach (flank). ? Decreased urine production. ? Decrease in the force of urine flow.  Muscle twitches and cramps, especially in the legs.  Confusion or trouble concentrating.  Loss of appetite.  Fever. How is this diagnosed? This condition may be diagnosed with tests, including:  Blood tests.  Urine tests.  Imaging tests.  A test in which a sample of tissue is removed from the kidneys to be examined under a microscope (kidney biopsy). How is this treated? Treatment for this condition depends on the cause and how severe the condition is. In mild cases, treatment may not be needed. The kidneys may heal on their own. In more severe cases, treatment will involve:  Treating the cause of the kidney injury. This may involve changing any medicines you are taking or adjusting your dosage.  Fluids. You may need specialized IV fluids to balance your body's needs.  Having a catheter placed to drain urine and prevent blockages.  Preventing problems from occurring. This may mean avoiding certain medicines or procedures that can cause further injury to the kidneys. In some cases treatment may also require:  A procedure to remove toxic wastes from the body (dialysis or continuous renal replacement therapy - CRRT).  Surgery. This may be done to repair a torn kidney, or to remove the blockage from the urinary system. Follow these instructions at home: Medicines  Take over-the-counter and prescription medicines only as told by your health care provider.  Do not take any new medicines without your health care provider's approval. Many medicines can worsen your kidney damage.  Do not take any vitamin and mineral supplements without your health care provider's approval. Many nutritional supplements can worsen your kidney damage. Lifestyle  If your health care provider prescribed changes to your diet, follow them. You may need to decrease the amount of protein you eat.  Achieve and  maintain a healthy weight. If you need help with this, ask your health care provider.  Start or continue an exercise plan. Try to exercise at least 30 minutes a day, 5 days a week.  Do not use any tobacco products, such as cigarettes, chewing tobacco, and e-cigarettes. If you need help quitting, ask your health care provider. General instructions  Keep track of your blood pressure. Report changes in your blood pressure as told by your health care provider.  Stay up to date with immunizations. Ask your health care provider which immunizations you need.  Keep all follow-up visits as told by your health care provider. This is important. Where to find more information  American Association of Kidney Patients: BombTimer.gl  National Kidney Foundation: www.kidney.[employer]: https://mathis.com/  Life Options Rehabilitation Program: ? www.lifeoptions.org ? www.kidneyschool.org Contact a health care provider if:  Your symptoms get worse.  You develop new symptoms. Get help right away if:  You develop symptoms of worsening kidney disease, which include: ? Headaches. ? Abnormally dark or light skin. ? Easy bruising. ? Frequent hiccups. ? Chest pain. ? Shortness of breath. ? End of menstruation in women. ? Seizures. ? Confusion or altered mental status. ? Abdominal or back pain. ? Itchiness.  You have a fever.  Your body is producing less urine.  You have pain or bleeding when you urinate. Summary  Acute kidney injury is a sudden worsening of kidney function.  Acute kidney injury can be caused by problems with blood flow to the kidneys, direct damage to the kidneys, and sudden blockage of urine flow.  Symptoms of this condition may not be obvious until it becomes severe. Symptoms may include edema, lethargy, confusion, nausea or vomiting, and problems passing urine.  This condition can usually be diagnosed with blood tests, urine tests, and imaging tests.  Sometimes a kidney biopsy is done to diagnose this condition.  Treatment for this condition often involves treating the underlying cause. It is treated with fluids, medicines, dialysis, diet changes, or surgery. This information is not intended to replace advice given to you by your health care provider. Make sure you discuss any questions you have with your health care provider. Document Released: 09/25/2010 Document Revised: 02/22/2017 Document Reviewed: 03/02/2016 Elsevier Patient Education  2020 [employer].

## 2019-01-12 NOTE — Plan of Care (Signed)
  Problem: Acute Rehab PT Goals(only PT should resolve) Goal: Patient Will Transfer Sit To/From Stand Outcome: Progressing Flowsheets (Taken 01/12/2019 1146) Patient will transfer sit to/from stand: with modified independence Goal: Pt Will Transfer Bed To Chair/Chair To Bed Outcome: Progressing Flowsheets (Taken 01/12/2019 1146) Pt will Transfer Bed to Chair/Chair to Bed:  with modified independence  Other (comment) Note: With RW Goal: Pt Will Perform Standing Balance Or Pre-Gait Outcome: Progressing Flowsheets (Taken 01/12/2019 1146) Pt will perform standing balance or pre-gait:  with Modified Independent  with Supervision Note: With RW Goal: Pt Will Ambulate Outcome: Progressing Flowsheets (Taken 01/12/2019 1146) Pt will Ambulate:  100 feet  with rolling walker  with min guard assist Goal: Pt Will Go Up/Down Stairs Outcome: Progressing Flowsheets (Taken 01/12/2019 1146) Pt will Go Up / Down Stairs:  3-5 stairs  with modified independence  with minimal assist  with rail(s)  11:48 AM, 01/12/19 Mearl Latin PT, DPT Physical Therapist at [facility]

## 2020-02-14 ENCOUNTER — Ambulatory Visit
Admission: EM | Admit: 2020-02-14 | Discharge: 2020-02-14 | Disposition: A | Discharge status: Home / Self Care | Payer: TRICARE For Life (TFL) | Attending: Emergency Medicine | Admitting: Emergency Medicine

## 2020-02-14 ENCOUNTER — Encounter: Payer: Self-pay | Admitting: Emergency Medicine

## 2020-02-14 DIAGNOSIS — N5089 Other specified disorders of the male genital organs: Secondary | ICD-10-CM

## 2020-02-14 MED ORDER — SULFAMETHOXAZOLE-TRIMETHOPRIM 800-160 MG PO TABS: 1.0000 | ORAL_TABLET | Freq: Two times a day (BID) | ORAL | 0 refills | Status: AC

## 2020-02-14 NOTE — ED Triage Notes (Signed)
Pt is currently being tx for UTI. Yesterday pt's testicles started to swell and states they are twice as big as normal.

## 2020-02-14 NOTE — Discharge Instructions (Addendum)
Bactrim DS was prescribed Was advised to stop Macrobid.  Advised to return to normal activity as sedentary lifestyle can cause fluid to stay in the groin and caused scrotal edema Push fluids and get plenty of rest.   Take antibiotic as directed and to completion Follow up with PCP if symptoms persists Return here or go to ER if you have any new or worsening symptoms such as fever, worsening abdominal pain, nausea/vomiting, flank pain, etc..Marland Kitchen

## 2020-02-14 NOTE — ED Provider Notes (Signed)
[facility]   Chief Complaint  Patient presents with  . Testicle Pain     SUBJECTIVE:  Gabriel Spencer is a 86 y.o. male who presented to the urgent care with a complaint of swelling testicle 2 days ago after starting Macrobid.  Reports he has UTI  and was given Macrobid few days ago by PCP.  Has started the medication and develop a swollen testicle.  Daughter states he has been sedentary recently due to UTI.  Denies testicular pain.  Has not tried any OTC medication.  Denies alleviating or aggravating factors.  Denies similar symptoms in the past.  Denies fever, chills, nausea, vomiting, abdominal pain, flank pain, abnormal penile discharge, hematuria.  LMP: No LMP for male patient.  ROS: As in HPI.  All other pertinent ROS negative.     Past Medical History:  Diagnosis Date  . Acute lower UTI 01/10/2019  . Arthritis   . Glaucoma   . Macular degeneration   . Thyroid disease    Past Surgical History:  Procedure Laterality Date  . BLADDER SURGERY    . CATARACT EXTRACTION, BILATERAL    . CHOLECYSTECTOMY  11/30/2011   Procedure: LAPAROSCOPIC CHOLECYSTECTOMY;  Surgeon: Jamesetta So, MD;  Location: [facility];  Service: General;  Laterality: N/A;  . CYSTOSCOPY/RETROGRADE/URETEROSCOPY Bilateral 03/10/2015   Procedure: Consuela Mimes BILATERAL Concha Se;  Surgeon: Irine Seal, MD;  Location: [facility];  Service: Urology;  Laterality: Bilateral;  . LYMPH NODE BIOPSY  1975   right  . RETINAL DETACHMENT SURGERY     right eye  . TRANSURETHRAL RESECTION OF BLADDER TUMOR Bilateral 03/10/2015   Procedure: TRANSURETHRAL RESECTION OF BLADDER TUMOR (TURBT) INSTILLATION WITH MITOMYCIN C IN RECOVERY;  Surgeon: Irine Seal, MD;  Location: [facility];  Service: Urology;  Laterality: Bilateral;  . TYMPANOPLASTY     right   Allergies  Allergen Reactions  . Keflex [Cephalexin] Hives   No current facility-administered medications on file prior to encounter.   Current Outpatient Medications on File  Prior to Encounter  Medication Sig Dispense Refill  . albuterol (PROVENTIL HFA;VENTOLIN HFA) 108 (90 Base) MCG/ACT inhaler Inhale 2 puffs into the lungs every 4 (four) hours as needed for wheezing or shortness of breath. 1 Inhaler 3  . allopurinol (ZYLOPRIM) 100 MG tablet Take 100 mg by mouth daily.    . brimonidine (ALPHAGAN) 0.15 % ophthalmic solution Place 1 drop into both eyes 2 (two) times daily.    . Ergocalciferol (VITAMIN D2) 400 units TABS Take 1 tablet by mouth daily.    Marland Kitchen ketotifen (ZADITOR) 0.025 % ophthalmic solution Place 1 drop into both eyes 2 (two) times daily.    Marland Kitchen latanoprost (XALATAN) 0.005 % ophthalmic solution Place 1 drop into both eyes at bedtime.    Marland Kitchen levothyroxine (SYNTHROID, LEVOTHROID) 75 MCG tablet Take 75 mcg by mouth daily before breakfast.    . meclizine (ANTIVERT) 12.5 MG tablet Take 12.5 mg by mouth 2 (two) times daily.    . Multiple Vitamins-Minerals (PRESERVISION AREDS PO) Take 1 tablet by mouth 2 (two) times daily.    . traMADol-acetaminophen (ULTRACET) 37.5-325 MG tablet Take 1 tablet by mouth every 6 (six) hours as needed. 12 tablet 0   Social History   Socioeconomic History  . Marital status: Married    Spouse name: Not on file  . Number of children: Not on file  . Years of education: Not on file  . Highest education level: Not on file  Occupational History  . Not  on file  Tobacco Use  . Smoking status: Former Smoker    Quit date: 03/27/1983    Years since quitting: 36.9  . Smokeless tobacco: Never Used  Vaping Use  . Vaping Use: Never used  Substance and Sexual Activity  . Alcohol use: No  . Drug use: No  . Sexual activity: Not on file  Other Topics Concern  . Not on file  Social History Narrative  . Not on file   Social Determinants of Health   Financial Resource Strain:   . Difficulty of Paying Living Expenses: Not on file  Food Insecurity:   . Worried About Charity fundraiser in the Last Year: Not on file  . Ran Out of Food in  the Last Year: Not on file  Transportation Needs:   . Lack of Transportation (Medical): Not on file  . Lack of Transportation (Non-Medical): Not on file  Physical Activity:   . Days of Exercise per Week: Not on file  . Minutes of Exercise per Session: Not on file  Stress:   . Feeling of Stress : Not on file  Social Connections:   . Frequency of Communication with Friends and Family: Not on file  . Frequency of Social Gatherings with Friends and Family: Not on file  . Attends Religious Services: Not on file  . Active Member of Clubs or Organizations: Not on file  . Attends Archivist Meetings: Not on file  . Marital Status: Not on file  Intimate Partner Violence:   . Fear of Current or Ex-Partner: Not on file  . Emotionally Abused: Not on file  . Physically Abused: Not on file  . Sexually Abused: Not on file   No family history on file.  OBJECTIVE:  Vitals:   02/14/20 1101 02/14/20 1103  BP:  134/69  Pulse:  86  Resp:  19  Temp:  97.8 F (36.6 C)  TempSrc:  Oral  SpO2:  94%  Weight: 220 lb (99.8 kg)   Height: 5\' 11"  (1.803 m)    General appearance: AOx3 in no acute distress HEENT: NCAT.  Oropharynx clear.  Lungs: clear to auscultation bilaterally without adventitious breath sounds Heart: regular rate and rhythm.  Radial pulses 2+ symmetrical bilaterally Abdomen: soft; non-distended; no tenderness; bowel sounds present; no guarding or rebound tenderness Back: no CVA tenderness Extremities: no edema; symmetrical with no gross deformities Skin: warm and dry Neurologic: Ambulates from chair to exam table without difficulty Psychological: alert and cooperative; normal mood and affect  Labs Reviewed - No data to display  ASSESSMENT & PLAN:  1. Swollen testicle     Meds ordered this encounter  Medications  . sulfamethoxazole-trimethoprim (BACTRIM DS) 800-160 MG tablet    Sig: Take 1 tablet by mouth 2 (two) times daily for 7 days.    Dispense:  14 tablet      Refill:  0   Discharge instructions  Bactrim DS was prescribed Was advised to stop Macrobid.   Advised to return to normal activity as a dental obstruction cause fluid to staining groin and cause scrotal edema. Push fluids and get plenty of rest.   Take antibiotic as directed and to completion Follow up with PCP if symptoms persists Return here or go to ER if you have any new or worsening symptoms such as fever, worsening abdominal pain, nausea/vomiting, flank pain, etc...  Outlined signs and symptoms indicating need for more acute intervention. Patient verbalized understanding. After Visit Summary given.  Emerson Monte, [place] 02/14/20 1129

## 2020-03-29 ENCOUNTER — Inpatient Hospital Stay (HOSPITAL_COMMUNITY)
Admission: EM | Admit: 2020-03-29 | Discharge: 2020-04-01 | DRG: 293 | Disposition: A | Discharge status: Skilled Nursing Facility | Payer: Medicare PPO | Attending: Internal Medicine | Admitting: Internal Medicine

## 2020-03-29 ENCOUNTER — Other Ambulatory Visit: Payer: Self-pay

## 2020-03-29 ENCOUNTER — Emergency Department (HOSPITAL_COMMUNITY): Payer: Medicare PPO

## 2020-03-29 ENCOUNTER — Encounter (HOSPITAL_COMMUNITY): Payer: Self-pay

## 2020-03-29 ENCOUNTER — Inpatient Hospital Stay (HOSPITAL_COMMUNITY): Payer: Medicare PPO

## 2020-03-29 DIAGNOSIS — Z9841 Cataract extraction status, right eye: Secondary | ICD-10-CM | POA: Diagnosis not present

## 2020-03-29 DIAGNOSIS — R059 Cough, unspecified: Secondary | ICD-10-CM | POA: Diagnosis present

## 2020-03-29 DIAGNOSIS — Z888 Allergy status to other drugs, medicaments and biological substances status: Secondary | ICD-10-CM | POA: Diagnosis not present

## 2020-03-29 DIAGNOSIS — Z8551 Personal history of malignant neoplasm of bladder: Secondary | ICD-10-CM

## 2020-03-29 DIAGNOSIS — I5033 Acute on chronic diastolic (congestive) heart failure: Secondary | ICD-10-CM | POA: Diagnosis present

## 2020-03-29 DIAGNOSIS — E663 Overweight: Secondary | ICD-10-CM | POA: Diagnosis present

## 2020-03-29 DIAGNOSIS — R7989 Other specified abnormal findings of blood chemistry: Secondary | ICD-10-CM

## 2020-03-29 DIAGNOSIS — R600 Localized edema: Secondary | ICD-10-CM | POA: Diagnosis not present

## 2020-03-29 DIAGNOSIS — Z79899 Other long term (current) drug therapy: Secondary | ICD-10-CM | POA: Diagnosis not present

## 2020-03-29 DIAGNOSIS — E039 Hypothyroidism, unspecified: Secondary | ICD-10-CM | POA: Diagnosis not present

## 2020-03-29 DIAGNOSIS — I509 Heart failure, unspecified: Secondary | ICD-10-CM

## 2020-03-29 DIAGNOSIS — Z20822 Contact with and (suspected) exposure to covid-19: Secondary | ICD-10-CM | POA: Diagnosis present

## 2020-03-29 DIAGNOSIS — J810 Acute pulmonary edema: Secondary | ICD-10-CM

## 2020-03-29 DIAGNOSIS — L89312 Pressure ulcer of right buttock, stage 2: Secondary | ICD-10-CM | POA: Diagnosis present

## 2020-03-29 DIAGNOSIS — I361 Nonrheumatic tricuspid (valve) insufficiency: Secondary | ICD-10-CM | POA: Diagnosis not present

## 2020-03-29 DIAGNOSIS — Z79890 Hormone replacement therapy (postmenopausal): Secondary | ICD-10-CM | POA: Diagnosis not present

## 2020-03-29 DIAGNOSIS — Z6828 Body mass index (BMI) 28.0-28.9, adult: Secondary | ICD-10-CM | POA: Diagnosis not present

## 2020-03-29 DIAGNOSIS — R531 Weakness: Secondary | ICD-10-CM | POA: Diagnosis not present

## 2020-03-29 DIAGNOSIS — L8990 Pressure ulcer of unspecified site, unspecified stage: Secondary | ICD-10-CM | POA: Diagnosis present

## 2020-03-29 DIAGNOSIS — I5032 Chronic diastolic (congestive) heart failure: Secondary | ICD-10-CM | POA: Insufficient documentation

## 2020-03-29 DIAGNOSIS — Z9842 Cataract extraction status, left eye: Secondary | ICD-10-CM

## 2020-03-29 DIAGNOSIS — R0602 Shortness of breath: Secondary | ICD-10-CM | POA: Diagnosis not present

## 2020-03-29 DIAGNOSIS — H409 Unspecified glaucoma: Secondary | ICD-10-CM | POA: Diagnosis present

## 2020-03-29 DIAGNOSIS — J811 Chronic pulmonary edema: Secondary | ICD-10-CM | POA: Diagnosis present

## 2020-03-29 DIAGNOSIS — J189 Pneumonia, unspecified organism: Secondary | ICD-10-CM

## 2020-03-29 DIAGNOSIS — Z87440 Personal history of urinary (tract) infections: Secondary | ICD-10-CM

## 2020-03-29 LAB — CBC WITH DIFFERENTIAL/PLATELET
Abs Immature Granulocytes: 0.01 10*3/uL (ref 0.00–0.07)
Basophils Absolute: 0.1 10*3/uL (ref 0.0–0.1)
Basophils Relative: 1 %
Eosinophils Absolute: 0.4 10*3/uL (ref 0.0–0.5)
Eosinophils Relative: 6 %
HCT: 33 % — ABNORMAL LOW (ref 39.0–52.0)
Hemoglobin: 11 g/dL — ABNORMAL LOW (ref 13.0–17.0)
Immature Granulocytes: 0 %
Lymphocytes Relative: 19 %
Lymphs Abs: 1.2 10*3/uL (ref 0.7–4.0)
MCH: 33.3 pg (ref 26.0–34.0)
MCHC: 33.3 g/dL (ref 30.0–36.0)
MCV: 100 fL (ref 80.0–100.0)
Monocytes Absolute: 0.5 10*3/uL (ref 0.1–1.0)
Monocytes Relative: 9 %
Neutro Abs: 4 10*3/uL (ref 1.7–7.7)
Neutrophils Relative %: 65 %
Platelets: 121 10*3/uL — ABNORMAL LOW (ref 150–400)
RBC: 3.3 MIL/uL — ABNORMAL LOW (ref 4.22–5.81)
RDW: 15.9 % — ABNORMAL HIGH (ref 11.5–15.5)
WBC: 6.1 10*3/uL (ref 4.0–10.5)
nRBC: 0 % (ref 0.0–0.2)

## 2020-03-29 LAB — URINALYSIS, ROUTINE W REFLEX MICROSCOPIC
Bilirubin Urine: NEGATIVE
Glucose, UA: NEGATIVE mg/dL
Ketones, ur: NEGATIVE mg/dL
Nitrite: NEGATIVE
Protein, ur: NEGATIVE mg/dL
Specific Gravity, Urine: 1.019 (ref 1.005–1.030)
pH: 5 (ref 5.0–8.0)

## 2020-03-29 LAB — COMPREHENSIVE METABOLIC PANEL
ALT: 21 U/L (ref 0–44)
AST: 52 U/L — ABNORMAL HIGH (ref 15–41)
Albumin: 2.3 g/dL — ABNORMAL LOW (ref 3.5–5.0)
Alkaline Phosphatase: 118 U/L (ref 38–126)
Anion gap: 7 (ref 5–15)
BUN: 19 mg/dL (ref 8–23)
CO2: 24 mmol/L (ref 22–32)
Calcium: 8.7 mg/dL — ABNORMAL LOW (ref 8.9–10.3)
Chloride: 106 mmol/L (ref 98–111)
Creatinine, Ser: 1 mg/dL (ref 0.61–1.24)
GFR, Estimated: >60 mL/min (ref >60)
Glucose, Bld: 92 mg/dL (ref 70–99)
Potassium: 4.1 mmol/L (ref 3.5–5.1)
Sodium: 137 mmol/L (ref 135–145)
Total Bilirubin: 3.2 mg/dL — ABNORMAL HIGH (ref 0.3–1.2)
Total Protein: 6.1 g/dL — ABNORMAL LOW (ref 6.5–8.1)

## 2020-03-29 LAB — RESP PANEL BY RT-PCR (FLU A&B, COVID) ARPGX2
Influenza A by PCR: NEGATIVE
Influenza B by PCR: NEGATIVE
SARS Coronavirus 2 by RT PCR: NEGATIVE

## 2020-03-29 LAB — GLUCOSE, CAPILLARY: Glucose-Capillary: 99 mg/dL (ref 70–99)

## 2020-03-29 LAB — BRAIN NATRIURETIC PEPTIDE: B Natriuretic Peptide: 129 pg/mL — ABNORMAL HIGH (ref 0.0–100.0)

## 2020-03-29 LAB — TSH: TSH: 6.366 u[IU]/mL — ABNORMAL HIGH (ref 0.350–4.500)

## 2020-03-29 MED ORDER — SODIUM CHLORIDE 0.9 % IV BOLUS
1000.0000 mL | Freq: Once | INTRAVENOUS | Status: AC
  Administered 2020-03-29: 1000 mL via INTRAVENOUS

## 2020-03-29 MED ORDER — ALLOPURINOL 100 MG PO TABS
100.0000 mg | ORAL_TABLET | Freq: Every day | ORAL | Status: DC
  Administered 2020-03-29 – 2020-04-01 (×4): 100 mg via ORAL
  Filled 2020-03-29 (×4): qty 1

## 2020-03-29 MED ORDER — OCUVITE-LUTEIN PO CAPS
1.0000 | ORAL_CAPSULE | Freq: Two times a day (BID) | ORAL | Status: DC
  Administered 2020-03-29 – 2020-04-01 (×6): 1 via ORAL
  Filled 2020-03-29 (×6): qty 1

## 2020-03-29 MED ORDER — SODIUM CHLORIDE 0.9% FLUSH: 3.0000 mL | INTRAVENOUS | Status: DC | PRN

## 2020-03-29 MED ORDER — ALBUTEROL SULFATE HFA 108 (90 BASE) MCG/ACT IN AERS: 2.0000 | INHALATION_SPRAY | RESPIRATORY_TRACT | Status: DC | PRN

## 2020-03-29 MED ORDER — SODIUM CHLORIDE 0.9% FLUSH
3.0000 mL | Freq: Two times a day (BID) | INTRAVENOUS | Status: DC
  Administered 2020-03-29 – 2020-03-31 (×4): 3 mL via INTRAVENOUS

## 2020-03-29 MED ORDER — SODIUM CHLORIDE 0.9 % IV SOLN: 250.0000 mL | INTRAVENOUS | Status: DC | PRN

## 2020-03-29 MED ORDER — CHOLECALCIFEROL 10 MCG (400 UNIT) PO TABS
400.0000 [IU] | ORAL_TABLET | Freq: Every day | ORAL | Status: DC
  Administered 2020-03-29 – 2020-04-01 (×4): 400 [IU] via ORAL
  Filled 2020-03-29 (×3): qty 1

## 2020-03-29 MED ORDER — ONDANSETRON HCL 4 MG/2ML IJ SOLN: 4.0000 mg | Freq: Four times a day (QID) | INTRAMUSCULAR | Status: DC | PRN

## 2020-03-29 MED ORDER — KETOTIFEN FUMARATE 0.025 % OP SOLN
1.0000 [drp] | Freq: Two times a day (BID) | OPHTHALMIC | Status: DC
  Administered 2020-03-29 – 2020-04-01 (×6): 1 [drp] via OPHTHALMIC
  Filled 2020-03-29: qty 5

## 2020-03-29 MED ORDER — LEVOFLOXACIN IN D5W 750 MG/150ML IV SOLN
750.0000 mg | Freq: Once | INTRAVENOUS | Status: AC
  Administered 2020-03-29: 750 mg via INTRAVENOUS
  Filled 2020-03-29: qty 150

## 2020-03-29 MED ORDER — ENOXAPARIN SODIUM 40 MG/0.4ML ~~LOC~~ SOLN
40.0000 mg | SUBCUTANEOUS | Status: DC
  Administered 2020-03-29 – 2020-03-31 (×3): 40 mg via SUBCUTANEOUS
  Filled 2020-03-29 (×3): qty 0.4

## 2020-03-29 MED ORDER — FUROSEMIDE 10 MG/ML IJ SOLN
20.0000 mg | Freq: Three times a day (TID) | INTRAMUSCULAR | Status: DC
  Administered 2020-03-29 – 2020-03-31 (×6): 20 mg via INTRAVENOUS
  Filled 2020-03-29 (×6): qty 2

## 2020-03-29 MED ORDER — BRIMONIDINE TARTRATE 0.15 % OP SOLN
1.0000 [drp] | Freq: Two times a day (BID) | OPHTHALMIC | Status: DC
  Administered 2020-03-29 – 2020-04-01 (×6): 1 [drp] via OPHTHALMIC
  Filled 2020-03-29: qty 5

## 2020-03-29 MED ORDER — ACETAMINOPHEN 325 MG PO TABS
650.0000 mg | ORAL_TABLET | ORAL | Status: DC | PRN
  Administered 2020-03-31 (×2): 650 mg via ORAL
  Filled 2020-03-29 (×2): qty 2

## 2020-03-29 MED ORDER — LEVOTHYROXINE SODIUM 50 MCG PO TABS
50.0000 ug | ORAL_TABLET | Freq: Every day | ORAL | Status: DC
  Administered 2020-03-30 – 2020-03-31 (×2): 50 ug via ORAL
  Filled 2020-03-29 (×2): qty 1

## 2020-03-29 MED ORDER — LATANOPROST 0.005 % OP SOLN
1.0000 [drp] | Freq: Every day | OPHTHALMIC | Status: DC
  Administered 2020-03-29 – 2020-03-31 (×3): 1 [drp] via OPHTHALMIC
  Filled 2020-03-29: qty 2.5

## 2020-03-29 NOTE — ED Triage Notes (Addendum)
Pt lives at home with wife. Brought in by EMS due to progressive weakness over the last couple weeks. Ems reported pt had been treated for UTI

## 2020-03-29 NOTE — ED Provider Notes (Signed)
[facility] EMERGENCY DEPARTMENT Provider Note  CSN: [id number] Arrival date & time: 03/29/20 1057    History Chief Complaint  Patient presents with  . Weakness    HPI  Gabriel Spencer is a 87 y.o. male with history of UTI and thyroid disease reports a couple of weeks of progressive weakness. He denies any specific area of weakness just states he is having trouble getting up from a chair or walking in his house. Per EMS he was treated for UTI recently. He reports poor appetite and dry mouth. Denies any CP but has had some cough and SOB for a few weeks. No known fevers, he is vaccinated for Covid.    Past Medical History:  Diagnosis Date  . Acute lower UTI 01/10/2019  . Arthritis   . Glaucoma   . Macular degeneration   . Thyroid disease     Past Surgical History:  Procedure Laterality Date  . BLADDER SURGERY    . CATARACT EXTRACTION, BILATERAL    . CHOLECYSTECTOMY  11/30/2011   Procedure: LAPAROSCOPIC CHOLECYSTECTOMY;  Surgeon: Jamesetta So, MD;  Location: [facility];  Service: General;  Laterality: N/A;  . CYSTOSCOPY/RETROGRADE/URETEROSCOPY Bilateral 03/10/2015   Procedure: Consuela Mimes BILATERAL Concha Se;  Surgeon: Irine Seal, MD;  Location: [facility];  Service: Urology;  Laterality: Bilateral;  . LYMPH NODE BIOPSY  1975   right  . RETINAL DETACHMENT SURGERY     right eye  . TRANSURETHRAL RESECTION OF BLADDER TUMOR Bilateral 03/10/2015   Procedure: TRANSURETHRAL RESECTION OF BLADDER TUMOR (TURBT) INSTILLATION WITH MITOMYCIN C IN RECOVERY;  Surgeon: Irine Seal, MD;  Location: [facility];  Service: Urology;  Laterality: Bilateral;  . TYMPANOPLASTY     right    No family history on file.  Social History   Tobacco Use  . Smoking status: Former Smoker    Quit date: 03/27/1983    Years since quitting: 37.0  . Smokeless tobacco: Never Used  Vaping Use  . Vaping Use: Never used  Substance Use Topics  . Alcohol use: No  . Drug use: No     Home Medications Prior to Admission  medications   Medication Sig Start Date End Date Taking? Authorizing Provider  albuterol (PROVENTIL HFA;VENTOLIN HFA) 108 (90 Base) MCG/ACT inhaler Inhale 2 puffs into the lungs every 4 (four) hours as needed for wheezing or shortness of breath. 01/05/17  Yes Noemi Chapel, MD  allopurinol (ZYLOPRIM) 100 MG tablet Take 100 mg by mouth daily.   Yes [provider]  brimonidine (ALPHAGAN) 0.15 % ophthalmic solution Place 1 drop into both eyes 2 (two) times daily.   Yes [provider]  Ergocalciferol (VITAMIN D2) 400 units TABS Take 1 tablet by mouth daily.   Yes [provider]  ketotifen (ZADITOR) 0.025 % ophthalmic solution Place 1 drop into both eyes 2 (two) times daily.   Yes [provider]  latanoprost (XALATAN) 0.005 % ophthalmic solution Place 1 drop into both eyes at bedtime.   Yes [provider]  levothyroxine (SYNTHROID) 50 MCG tablet Take 50 mcg by mouth daily. 12/24/19  Yes [provider]  meclizine (ANTIVERT) 12.5 MG tablet Take 12.5 mg by mouth 2 (two) times daily.   Yes [provider]  Multiple Vitamins-Minerals (PRESERVISION AREDS PO) Take 1 tablet by mouth 2 (two) times daily.   Yes [provider]  traMADol-acetaminophen (ULTRACET) 37.5-325 MG tablet Take 1 tablet by mouth every 6 (six) hours as needed. Patient not taking: Reported on 03/29/2020 03/10/15  Bjorn Pippin, MD     Allergies    Keflex [cephalexin]   Review of Systems   Review of Systems A comprehensive review of systems was completed and negative except as noted in HPI.    Physical Exam BP 135/74   Pulse 91   Temp 98.1 F (36.7 C) (Oral)   Resp (!) 21   SpO2 95%   Physical Exam Vitals and nursing note reviewed.  Constitutional:      Appearance: Normal appearance.  HENT:     Head: Normocephalic and atraumatic.     Nose: Nose normal.     Mouth/Throat:     Mouth: Mucous membranes are dry.  Eyes:     Extraocular Movements:  Extraocular movements intact.     Conjunctiva/sclera: Conjunctivae normal.  Cardiovascular:     Rate and Rhythm: Normal rate.  Pulmonary:     Effort: Pulmonary effort is normal.     Breath sounds: Normal breath sounds.  Abdominal:     General: Abdomen is flat.     Palpations: Abdomen is soft.     Tenderness: There is no abdominal tenderness.  Musculoskeletal:        General: No swelling. Normal range of motion.     Cervical back: Neck supple.     Right lower leg: Edema present.     Left lower leg: Edema present.  Skin:    General: Skin is warm and dry.  Neurological:     General: No focal deficit present.     Mental Status: He is alert and oriented to person, place, and time.     Sensory: No sensory deficit.     Motor: No weakness.  Psychiatric:        Mood and Affect: Mood normal.      ED Results / Procedures / Treatments   Labs (all labs ordered are listed, but only abnormal results are displayed) Labs Reviewed  COMPREHENSIVE METABOLIC PANEL - Abnormal; Notable for the following components:      Result Value   Calcium 8.7 (*)    Total Protein 6.1 (*)    Albumin 2.3 (*)    AST 52 (*)    Total Bilirubin 3.2 (*)    All other components within normal limits  CBC WITH DIFFERENTIAL/PLATELET - Abnormal; Notable for the following components:   RBC 3.30 (*)    Hemoglobin 11.0 (*)    HCT 33.0 (*)    RDW 15.9 (*)    Platelets 121 (*)    All other components within normal limits  TSH - Abnormal; Notable for the following components:   TSH 6.366 (*)    All other components within normal limits  BRAIN NATRIURETIC PEPTIDE - Abnormal; Notable for the following components:   B Natriuretic Peptide 129.0 (*)    All other components within normal limits  RESP PANEL BY RT-PCR (FLU A&B, COVID) ARPGX2  URINALYSIS, ROUTINE W REFLEX MICROSCOPIC    EKG EKG Interpretation  Date/Time:  Tuesday March 29 2020 11:28:58 EST Ventricular Rate:  94 PR Interval:    QRS  Duration: 108 QT Interval:  381 QTC Calculation: 472 R Axis:   -26 Text Interpretation: Sinus rhythm Multiple premature complexes, vent & supraven Short PR interval Anterior infarct, old Minimal ST depression, inferior leads Since last tracing QRS amplitude is diminished in limb leads Confirmed by Susy Frizzle [phone]) on 03/29/2020 1:03:48 PM   Radiology DG Chest Port 1 View  Result Date: 03/29/2020 CLINICAL DATA:  Cough. EXAM: PORTABLE CHEST  1 VIEW COMPARISON:  Chest x-ray 01/05/2017. FINDINGS: Cardiomegaly. Diffuse bilateral pulmonary infiltrates/edema bilateral pleural effusions. Findings most consistent CHF. Bilateral pneumonia cannot be excluded. No pneumothorax. IMPRESSION: Cardiomegaly with diffuse bilateral pulmonary infiltrates/edema and bilateral pleural effusions. Findings most consistent CHF. Bilateral pneumonia cannot be excluded. Electronically Signed   By: Marcello Moores  Register   On: 03/29/2020 12:32    Procedures Procedures  Medications Ordered in the ED Medications  levofloxacin (LEVAQUIN) IVPB 750 mg (has no administration in time range)  sodium chloride 0.9 % bolus 1,000 mL (1,000 mLs Intravenous New Bag/Given 03/29/20 1234)     MDM Rules/Calculators/A&P MDM Patient with nonspecific weakness, has had some URI symptoms and reportedly a UTI recently as well. Will check labs, EKG and CXR. Covid swab.  ED Course  I have reviewed the triage vital signs and the nursing notes.  Pertinent labs & imaging results that were available during my care of the patient were reviewed by me and considered in my medical decision making (see chart for details).  Clinical Course as of 03/29/20 1520  Tue Mar 29, 2020  1229 CBC with mild anemia [CS]  1301 CMP shows mildly elevated bilirubin of unclear significance. Creatinine and elytes are normal.  [CS]  1350 TSH is mildly elevated, may contribute to his weakness.  [CS]  1500 Covid is neg. CXR concerning for CHF vs pneumonia. BNP is not  significantly elevated so will treat for CAP. Plan admission to hospital as well.  [CS]  16 Spoke with Dr. Jonnie Finner, Hospitalist, who will evaluate for admission.  [CS]    Clinical Course User Index [CS] Truddie Hidden, MD    Final Clinical Impression(s) / ED Diagnoses Final diagnoses:  Cough  Generalized weakness  Community acquired pneumonia, unspecified laterality    Rx / DC Orders ED Discharge Orders    None       Truddie Hidden, MD 03/29/20 1520

## 2020-03-29 NOTE — H&P (Addendum)
[facility] History & Physical  Rob Doctor, hospital MD  Gabriel Spencer 03/29/2020  Chief Complaint: Gen weakness HPI: The patient is a 87 y.o. year-old w/ hx of macular degeneration, glaucoma, hypothryoid, DJD, UTI, presented to ED this am w/ 2 wks hx of gen'd weakness.  Recently treated in OP setting for UTI.  Trouble getting up to stand and walking around the house. Dry mouth , poor appetite, no CP, +some cough and SOB recently as well. No fevers. +vaccinated.  ED w/u showed afeb, VSS, SpO2 95%. CXR showed possible CHF/ edema. BNP wnl, ED gave abx for suspected CAP and 1 L bolus.  Asked to see for admission.   Pt seen in room, dtr here she is a Marine scientist.  Pt lives his wife at home, did 22 yrs in the [employer]. Notes difficutly getting around primarily, no orthopnea or PND. + new leg swelling for around 2 mos. No cough, no prod cough, no hemoptysis. No hx heart disease.    Not using tobacco, no hx DVT. Hist bladder cancer, not sure details. No HTN or DM.   ROS  denies CP  no joint pain   no HA  no blurry vision  no rash  no diarrhea  no nausea/ vomiting  no dysuria  no difficulty voiding  no change in urine color   Past Medical History  Past Medical History:  Diagnosis Date  . Acute lower UTI 01/10/2019  . Arthritis   . Glaucoma   . Macular degeneration   . Thyroid disease    Past Surgical History  Past Surgical History:  Procedure Laterality Date  . BLADDER SURGERY    . CATARACT EXTRACTION, BILATERAL    . CHOLECYSTECTOMY  11/30/2011   Procedure: LAPAROSCOPIC CHOLECYSTECTOMY;  Surgeon: Jamesetta So, MD;  Location: [facility];  Service: General;  Laterality: N/A;  . CYSTOSCOPY/RETROGRADE/URETEROSCOPY Bilateral 03/10/2015   Procedure: Consuela Mimes BILATERAL Concha Se;  Surgeon: Irine Seal, MD;  Location: [facility];  Service: Urology;  Laterality: Bilateral;  . LYMPH NODE BIOPSY  1975   right  . RETINAL DETACHMENT SURGERY     right eye  . TRANSURETHRAL RESECTION OF BLADDER TUMOR  Bilateral 03/10/2015   Procedure: TRANSURETHRAL RESECTION OF BLADDER TUMOR (TURBT) INSTILLATION WITH MITOMYCIN C IN RECOVERY;  Surgeon: Irine Seal, MD;  Location: [facility];  Service: Urology;  Laterality: Bilateral;  . TYMPANOPLASTY     right   Family History No family history on file. Social History  reports that he quit smoking about 37 years ago. He has never used smokeless tobacco. He reports that he does not drink alcohol and does not use drugs. Allergies  Allergies  Allergen Reactions  . Keflex [Cephalexin] Hives   Home medications Prior to Admission medications   Medication Sig Start Date End Date Taking? Authorizing Provider  albuterol (PROVENTIL HFA;VENTOLIN HFA) 108 (90 Base) MCG/ACT inhaler Inhale 2 puffs into the lungs every 4 (four) hours as needed for wheezing or shortness of breath. 01/05/17  Yes Noemi Chapel, MD  allopurinol (ZYLOPRIM) 100 MG tablet Take 100 mg by mouth daily.   Yes [provider]  brimonidine (ALPHAGAN) 0.15 % ophthalmic solution Place 1 drop into both eyes 2 (two) times daily.   Yes [provider]  Ergocalciferol (VITAMIN D2) 400 units TABS Take 1 tablet by mouth daily.   Yes [provider]  ketotifen (ZADITOR) 0.025 % ophthalmic solution Place 1 drop into both eyes 2 (two) times daily.   Yes [provider]  latanoprost (XALATAN) 0.005 % ophthalmic solution Place 1 drop into both eyes at bedtime.   Yes [provider]  levothyroxine (SYNTHROID) 50 MCG tablet Take 50 mcg by mouth daily. 12/24/19  Yes [provider]  meclizine (ANTIVERT) 12.5 MG tablet Take 12.5 mg by mouth 2 (two) times daily.   Yes [provider]  Multiple Vitamins-Minerals (PRESERVISION AREDS PO) Take 1 tablet by mouth 2 (two) times daily.   Yes [provider]  traMADol-acetaminophen (ULTRACET) 37.5-325 MG tablet Take 1 tablet by mouth every 6 (six) hours as needed. Patient not taking: Reported on 03/29/2020  03/10/15   Irine Seal, MD       Exam Gen chron ill appearing elderly WM, no distress, nasal O2 No rash, cyanosis or gangrene Sclera anicteric, throat clear  +JVD Chest soft subtle rales 1/3- 1/2 bilat RRR no rub or gallop, soft sem Abd soft ntnd no mass, question possibley some ascites +bs GU normal male MS no joint effusions or deformity Ext diffuse bilat edema 2-3+ lower legs and pitting up into lateral hips Neuro is alert, Ox 3 , nf    Home meds:  - zyloprim 100/ synthroid 50 ug  - ultracet qid prn  - albuterol hfa prn  - prn's/ vitamins/ supplements/ eyedrops        Temp 98.1  RR 18  , HR 90,  BP 130/ 70   95% on RA    Na 137  K 4.1  CO2 24  BN 19  Cr 1.00  Ca 8.7   Alb 2.3  Tbili 3.1    BNP 129    WBC 6K Hb 11 mcv 100  plt 121  TSH 6.3     CXR overpenetrated, new +RLL infiltrate c/t 2018 film, read as CHF but that may be technique-related          COVID neg, flu neg  Assessment/ Plan: 1. SOB/ gen'd weakness - exam/ CXR/ history most consistent with new onset CHF.  No fever/ WBC or pna symptoms, will hold on further abx. Will plan IV lasix low -dose, get ECHO and consult cardiology. Not in any distress. Admit to telemetry.  2. ^LFT's - could be related to #1, get hepatitis panel and RUQ [place]  3. H/o gout 4. H/o hypothyroidism - cont synthroid at 50 ug, TSH up a little 5. Poor vision - hx glaucoma + mac degeneration, cont drops      Kelly Splinter  MD 03/29/2020, 3:20 PM

## 2020-03-30 ENCOUNTER — Inpatient Hospital Stay (HOSPITAL_COMMUNITY): Payer: Medicare PPO

## 2020-03-30 DIAGNOSIS — I5033 Acute on chronic diastolic (congestive) heart failure: Secondary | ICD-10-CM

## 2020-03-30 DIAGNOSIS — R600 Localized edema: Secondary | ICD-10-CM | POA: Diagnosis not present

## 2020-03-30 DIAGNOSIS — I361 Nonrheumatic tricuspid (valve) insufficiency: Secondary | ICD-10-CM | POA: Diagnosis not present

## 2020-03-30 DIAGNOSIS — H409 Unspecified glaucoma: Secondary | ICD-10-CM | POA: Diagnosis present

## 2020-03-30 DIAGNOSIS — L8990 Pressure ulcer of unspecified site, unspecified stage: Secondary | ICD-10-CM | POA: Diagnosis present

## 2020-03-30 DIAGNOSIS — E039 Hypothyroidism, unspecified: Secondary | ICD-10-CM

## 2020-03-30 DIAGNOSIS — E663 Overweight: Secondary | ICD-10-CM | POA: Diagnosis present

## 2020-03-30 LAB — BASIC METABOLIC PANEL
Anion gap: 9 (ref 5–15)
BUN: 19 mg/dL (ref 8–23)
CO2: 25 mmol/L (ref 22–32)
Calcium: 8.2 mg/dL — ABNORMAL LOW (ref 8.9–10.3)
Chloride: 104 mmol/L (ref 98–111)
Creatinine, Ser: 0.95 mg/dL (ref 0.61–1.24)
GFR, Estimated: >60 mL/min (ref >60)
Glucose, Bld: 82 mg/dL (ref 70–99)
Potassium: 3.7 mmol/L (ref 3.5–5.1)
Sodium: 138 mmol/L (ref 135–145)

## 2020-03-30 LAB — T4, FREE: Free T4: 1.32 ng/dL — ABNORMAL HIGH (ref 0.61–1.12)

## 2020-03-30 LAB — ECHOCARDIOGRAM COMPLETE
Area-P 1/2: 2.08 cm2
Height: 71 in
S' Lateral: 2.8 cm
Weight: 3305.14 oz

## 2020-03-30 LAB — HEPATITIS PANEL, ACUTE
HCV Ab: NONREACTIVE
Hep A IgM: NONREACTIVE
Hep B C IgM: NONREACTIVE
Hepatitis B Surface Ag: NONREACTIVE

## 2020-03-30 NOTE — Progress Notes (Signed)
*  PRELIMINARY RESULTS* Echocardiogram 2D Echocardiogram has been performed.  Stacey Drain 03/30/2020, 10:33 AM

## 2020-03-30 NOTE — Progress Notes (Addendum)
PROGRESS NOTE  Gabriel Spencer [id number] DOB: [birth year] DOA: 03/29/2020 PCP: Doree Albee, MD  HPI/Recap of past 24 hours: Patient is an 87 year old male past medical history of hypothyroidism and glaucoma who presented to the emergency room on 1/4 with complaints of shortness of breath and nonproductive cough which have been going on for several days.  Chest x-ray revealed bilateral pulmonary edema consistent with CHF.  Patient has no previous history.  Started on IV Lasix and admitted to the hospitalist service.  The following day, patient has diuresed quite a bit according to him. (Urine output not recorded in epic.)  He says he is feeling better although not yet back to normal.  Still feels very weak.  Echocardiogram done noted preserved ejection fraction, but was not able to comment on diastolic dysfunction.  Assessment/Plan: Principal Problem:   Acute on chronic diastolic CHF (congestive heart failure) (HCC) causing pulmonary edema: Continue IV Lasix.  Not yet euvolemic.  Looks to be mild based off of echocardiogram and mildly elevated BNP.  Should be fully diuresed by tomorrow.  Physical therapy to see and will provide education. Active Problems:   History of bladder cancer, 2016   SOB (shortness of breath)   Hypothyroidism: Continue Synthroid.  TSH mildly elevated so checking free T4 which may just be simply due to acute illness  Glaucoma: Continue drops.  Overweight: Patient meets criteria with BMI greater than 25    Generalized weakness: Physical therapy to see    Pressure injury of skin   Code Status: Full code  Family Communication: Updated wife by phone  Disposition Plan: Anticipate discharge to home, possibly with home health tomorrow once he is fully diuresed and evaluated by PT   Consultants:  None  Procedures:  Echocardiogram done 1/5: Preserved ejection fraction unable to comment on diastolic function  Antimicrobials:  None  DVT  prophylaxis: Lovenox   Objective: Vitals:   03/30/20 0206 03/30/20 0608  BP: (!) 128/53 118/68  Pulse: 88 96  Resp: 20 20  Temp: 97.8 F (36.6 C) 97.6 F (36.4 C)  SpO2: 96% 96%   No intake or output data in the 24 hours ending 03/30/20 1138 Filed Weights   03/29/20 2213  Weight: 93.7 kg   Body mass index is 28.81 kg/m.  Exam:   General: Alert and oriented x3, fatigued  HEENT: Normocephalic and atraumatic, mucous membranes are moist  Cardiovascular: Regular rate and rhythm, S1-S2  Respiratory: Decreased breath sounds bibasilar  Abdomen: Soft, nontender, nondistended, positive bowel sounds  Musculoskeletal: 1-2+ pitting edema from the knees down Skin: No skin breaks, tears or lesions.  Anterior aspect of his legs are quite dry with some early signs of venous stasis from the knees down bilaterally  Psychiatry: Appropriate, no evidence of psychoses   Data Reviewed: CBC: Recent Labs  Lab 03/29/20 1151  WBC 6.1  NEUTROABS 4.0  HGB 11.0*  HCT 33.0*  MCV 100.0  PLT 123XX123*   Basic Metabolic Panel: Recent Labs  Lab 03/29/20 1151 03/30/20 0612  NA 137 138  K 4.1 3.7  CL 106 104  CO2 24 25  GLUCOSE 92 82  BUN 19 19  CREATININE 1.00 0.95  CALCIUM 8.7* 8.2*   GFR: Estimated Creatinine Clearance: 65.3 mL/min (by C-G formula based on SCr of 0.95 mg/dL). Liver Function Tests: Recent Labs  Lab 03/29/20 1151  AST 52*  ALT 21  ALKPHOS 118  BILITOT 3.2*  PROT 6.1*  ALBUMIN 2.3*   No results for  input(s): LIPASE, AMYLASE in the last 168 hours. No results for input(s): AMMONIA in the last 168 hours. Coagulation Profile: No results for input(s): INR, PROTIME in the last 168 hours. Cardiac Enzymes: No results for input(s): CKTOTAL, CKMB, CKMBINDEX, TROPONINI in the last 168 hours. BNP (last 3 results) No results for input(s): PROBNP in the last 8760 hours. HbA1C: No results for input(s): HGBA1C in the last 72 hours. CBG: Recent Labs  Lab  03/29/20 2213  GLUCAP 99   Lipid Profile: No results for input(s): CHOL, HDL, LDLCALC, TRIG, CHOLHDL, LDLDIRECT in the last 72 hours. Thyroid Function Tests: Recent Labs    03/29/20 1151  TSH 6.366*   Anemia Panel: No results for input(s): VITAMINB12, FOLATE, FERRITIN, TIBC, IRON, RETICCTPCT in the last 72 hours. Urine analysis:    Component Value Date/Time   COLORURINE YELLOW 03/29/2020 1614   APPEARANCEUR HAZY (A) 03/29/2020 1614   LABSPEC 1.019 03/29/2020 1614   PHURINE 5.0 03/29/2020 1614   GLUCOSEU NEGATIVE 03/29/2020 1614   HGBUR SMALL (A) 03/29/2020 1614   BILIRUBINUR NEGATIVE 03/29/2020 1614   KETONESUR NEGATIVE 03/29/2020 1614   PROTEINUR NEGATIVE 03/29/2020 1614   UROBILINOGEN 1.0 01/28/2015 0925   NITRITE NEGATIVE 03/29/2020 1614   LEUKOCYTESUR LARGE (A) 03/29/2020 1614   Sepsis Labs: @LABRCNTIP (procalcitonin:4,lacticidven:4)  ) Recent Results (from the past 240 hour(s))  Resp Panel by RT-PCR (Flu A&B, Covid) Nasopharyngeal Swab     Status: None   Collection Time: 03/29/20 12:00 PM   Specimen: Nasopharyngeal Swab; Nasopharyngeal(NP) swabs in vial transport medium  Result Value Ref Range Status   SARS Coronavirus 2 by RT PCR NEGATIVE NEGATIVE Final    Comment: (NOTE) SARS-CoV-2 target nucleic acids are NOT DETECTED.  The SARS-CoV-2 RNA is generally detectable in upper respiratory specimens during the acute phase of infection. The lowest concentration of SARS-CoV-2 viral copies this assay can detect is 138 copies/mL. A negative result does not preclude SARS-Cov-2 infection and should not be used as the sole basis for treatment or other patient management decisions. A negative result may occur with  improper specimen collection/handling, submission of specimen other than nasopharyngeal swab, presence of viral mutation(s) within the areas targeted by this assay, and inadequate number of viral copies(<138 copies/mL). A negative result must be combined  with clinical observations, patient history, and epidemiological information. The expected result is Negative.  Fact Sheet for Patients:  05/27/20  Fact Sheet for Healthcare Providers:  BloggerCourse.com  This test is no t yet approved or cleared by the [employer] FDA and  has been authorized for detection and/or diagnosis of SARS-CoV-2 by FDA under an Emergency Use Authorization (EUA). This EUA will remain  in effect (meaning this test can be used) for the duration of the COVID-19 declaration under Section 564(b)(1) of the Act, 21 U.S.C.section 360bbb-3(b)(1), unless the authorization is terminated  or revoked sooner.       Influenza A by PCR NEGATIVE NEGATIVE Final   Influenza B by PCR NEGATIVE NEGATIVE Final    Comment: (NOTE) The Xpert Xpress SARS-CoV-2/FLU/RSV plus assay is intended as an aid in the diagnosis of influenza from Nasopharyngeal swab specimens and should not be used as a sole basis for treatment. Nasal washings and aspirates are unacceptable for Xpert Xpress SARS-CoV-2/FLU/RSV testing.  Fact Sheet for Patients: [place]  Fact Sheet for Healthcare Providers: BloggerCourse.com  This test is not yet approved or cleared by the [employer].[place] FDA and has been authorized for detection and/or diagnosis of SARS-CoV-2 by FDA under an  Emergency Use Authorization (EUA). This EUA will remain in effect (meaning this test can be used) for the duration of the COVID-19 declaration under Section 564(b)(1) of the Act, 21 U.S.C. section 360bbb-3(b)(1), unless the authorization is terminated or revoked.  Performed at [facility], [address], [place], [place] [address]       Studies: DG Chest [facility] 1 View  Result Date: 03/29/2020 CLINICAL DATA:  Cough. EXAM: PORTABLE CHEST 1 VIEW COMPARISON:  Chest x-ray 01/05/2017. FINDINGS: Cardiomegaly. Diffuse  bilateral pulmonary infiltrates/edema bilateral pleural effusions. Findings most consistent CHF. Bilateral pneumonia cannot be excluded. No pneumothorax. IMPRESSION: Cardiomegaly with diffuse bilateral pulmonary infiltrates/edema and bilateral pleural effusions. Findings most consistent CHF. Bilateral pneumonia cannot be excluded. Electronically Signed   By: Marcello Moores  Register   On: 03/29/2020 12:32   ECHOCARDIOGRAM COMPLETE  Result Date: 03/30/2020    ECHOCARDIOGRAM REPORT   Patient Name:   Gabriel Spencer Date of Exam: 03/30/2020 Medical Rec #:  [id number]    Height:       71.0 in Accession #:    [id number]   Weight:       206.6 lb Date of Birth:  07/13/1931    BSA:          2.138 m Patient Age:    26 years     BP:           118/68 mmHg Patient Gender: M            HR:           73 bpm. Exam Location:  [facility] Procedure: 2D Echo, Cardiac Doppler and Color Doppler Indications:    Acute CHF (congestive heart failure) ([place]) [id number]  History:        Patient has prior history of Echocardiogram examinations, most                 recent 01/11/2019. CHF. AKI (acute kidney injury, Bladder                 cancer.  Sonographer:    Alvino Chapel RCS Referring Phys: [id number] [place]  1. Left ventricular ejection fraction, by estimation, is 70 to 75%. The left ventricle has hyperdynamic function. The left ventricle has no regional wall motion abnormalities. There is mild left ventricular hypertrophy. Left ventricular diastolic parameters are indeterminate.  2. Right ventricular systolic function is normal. The right ventricular size is normal. There is mildly elevated pulmonary artery systolic pressure. The estimated right ventricular systolic pressure is [id number] mmHg.  3. Left atrial size was mildly dilated.  4. The mitral valve is grossly normal. Trivial mitral valve regurgitation. Moderate to severe mitral annular calcification.  5. The aortic valve is tricuspid. There is mild calcification of the aortic valve.  Aortic valve regurgitation is not visualized.  6. The inferior vena cava is dilated in size with >50% respiratory variability, suggesting right atrial pressure of 8 mmHg. FINDINGS  Left Ventricle: Left ventricular ejection fraction, by estimation, is 70 to 75%. The left ventricle has hyperdynamic function. The left ventricle has no regional wall motion abnormalities. The left ventricular internal cavity size was normal in size. There is mild left ventricular hypertrophy. Left ventricular diastolic parameters are indeterminate. Right Ventricle: The right ventricular size is normal. No increase in right ventricular wall thickness. Right ventricular systolic function is normal. There is mildly elevated pulmonary artery systolic pressure. The tricuspid regurgitant velocity is 2.88  m/s, and with an assumed right atrial pressure of 8 mmHg,  the estimated right ventricular systolic pressure is [id number] mmHg. Left Atrium: Left atrial size was mildly dilated. Right Atrium: Right atrial size was normal in size. Pericardium: There is no evidence of pericardial effusion. Mitral Valve: The mitral valve is grossly normal. Moderate to severe mitral annular calcification. Trivial mitral valve regurgitation. Tricuspid Valve: The tricuspid valve is grossly normal. Tricuspid valve regurgitation is mild. Aortic Valve: The aortic valve is tricuspid. There is mild calcification of the aortic valve. There is mild aortic valve annular calcification. Aortic valve regurgitation is not visualized. Pulmonic Valve: The pulmonic valve was grossly normal. Pulmonic valve regurgitation is trivial. Aorta: The aortic root is normal in size and structure. Venous: The inferior vena cava is dilated in size with greater than 50% respiratory variability, suggesting right atrial pressure of 8 mmHg. IAS/Shunts: No atrial level shunt detected by color flow Doppler.  LEFT VENTRICLE PLAX 2D LVIDd:         4.50 cm  Diastology LVIDs:         2.80 cm  LV e' medial:     7.40 cm/s LV PW:         1.10 cm  LV E/e' medial:  11.6 LV IVS:        0.90 cm  LV e' lateral:   8.92 cm/s LVOT diam:     2.00 cm  LV E/e' lateral: 9.6 LV SV:         100 LV SV Index:   47 LVOT Area:     3.14 cm  RIGHT VENTRICLE RV S prime:     13.10 cm/s TAPSE (M-mode): 2.9 cm LEFT ATRIUM             Index       RIGHT ATRIUM           Index LA diam:        4.70 cm 2.20 cm/m  RA Area:     19.10 cm LA Vol (A2C):   73.6 ml 34.43 ml/m RA Volume:   52.80 ml  24.70 ml/m LA Vol (A4C):   70.6 ml 33.03 ml/m LA Biplane Vol: 76.3 ml 35.69 ml/m  AORTIC VALVE LVOT Vmax:   123.00 cm/s LVOT Vmean:  83.550 cm/s LVOT VTI:    0.318 m  AORTA Ao Root diam: 3.30 cm MITRAL VALVE                TRICUSPID VALVE MV Area (PHT): 2.08 cm     TR Peak grad:   33.2 mmHg MV Decel Time: 366 msec     TR Vmax:        288.00 cm/s MV E velocity: 85.60 cm/s MV A velocity: 103.60 cm/s  SHUNTS MV E/A ratio:  0.83         Systemic VTI:  0.32 m                             Systemic Diam: 2.00 cm Rozann Lesches MD Electronically signed by Rozann Lesches MD Signature Date/Time: 03/30/2020/11:01:53 AM    Final    US Abdomen Limited RUQ (LIVER/GB)  Result Date: 03/29/2020 CLINICAL DATA:  Elevated liver function tests. Status post prior cholecystectomy. EXAM: ULTRASOUND ABDOMEN LIMITED RIGHT UPPER QUADRANT COMPARISON:  11/28/2011 FINDINGS: Gallbladder: Surgically absent Common bile duct: Diameter: 4 mm. Liver: The liver is shrunken, nodular and grossly cirrhotic. Parenchyma is very heterogeneous. No gross lesion or intrahepatic biliary ductal dilatation. The portal vein is not very  well visualized. Color Doppler flow is demonstrated in the main portal vein with limited evaluation of the intrahepatic portal veins. Other: Moderate ascites is present surrounding the liver in the right upper quadrant. IMPRESSION: 1. The liver demonstrates evidence of advanced cirrhosis by ultrasound. 2. Moderate ascites surrounding the liver in the right upper quadrant.  Electronically Signed   By: Aletta Edouard M.D.   On: 03/29/2020 17:01    Scheduled Meds: . allopurinol  100 mg Oral Daily  . brimonidine  1 drop Both Eyes BID  . cholecalciferol  400 Units Oral Daily  . enoxaparin (LOVENOX) injection  40 mg Subcutaneous Q24H  . furosemide  20 mg Intravenous Q8H  . ketotifen  1 drop Both Eyes BID  . latanoprost  1 drop Both Eyes QHS  . levothyroxine  50 mcg Oral QAC breakfast  . multivitamin-lutein  1 capsule Oral BID  . sodium chloride flush  3 mL Intravenous Q12H    Continuous Infusions: . sodium chloride       LOS: 1 day     Annita Brod, MD [facility]   03/30/2020, 11:38 AM

## 2020-03-30 NOTE — Plan of Care (Signed)
  Problem: Acute Rehab PT Goals(only PT should resolve) Goal: Pt Will Go Supine/Side To Sit Outcome: Progressing Flowsheets (Taken 03/30/2020 1628) Pt will go Supine/Side to Sit: with supervision Goal: Patient Will Transfer Sit To/From Stand Outcome: Progressing Flowsheets (Taken 03/30/2020 1628) Patient will transfer sit to/from stand: with minimal assist Goal: Pt Will Transfer Bed To Chair/Chair To Bed Outcome: Progressing Flowsheets (Taken 03/30/2020 1628) Pt will Transfer Bed to Chair/Chair to Bed: with min assist Goal: Pt Will Ambulate Outcome: Progressing Flowsheets (Taken 03/30/2020 1628) Pt will Ambulate:  25 feet  with minimal assist  with rolling walker  with moderate assist   4:28 PM, 03/30/20 Ocie Bob, MPT Physical Therapist with [facility] [phone] office [phone] mobile phone

## 2020-03-30 NOTE — TOC Initial Note (Signed)
Transition of Care [facility]) - Initial/Assessment Note   Patient Details  Name: Gabriel Spencer MRN: [id number] Date of Birth: 1931-05-02  [facility] CM/SW Contact:    Sherie Don, LCSW Phone Number: 03/30/2020, 9:26 AM  Clinical Narrative: Patient is an 87 year old male who was admitted for CHF. TOC received consult for CHF screening. CSW completed assessment with patient's wife, Carlosmanuel Priddy. Per wife, patient resides at home with her and was independent with his ADLs until a few days ago. Patient's current DME includes a walker and cane, which he uses only as needed. Patient is able to afford his medications each month, which wife reports he takes as prescribed. Wife and daughter provide transportation to appointments.  Per CHF screening, wife stated "as much as I know" the patient is following a heart healthy diet. Patient is not currently restricting his salt or fluid intake. Wife reported patient is not completing daily weights to monitor fluid retention as CHF is a new diagnosis. TOC to follow for discharge needs.  Expected Discharge Plan: Home/Self Care Barriers to Discharge: Continued Medical Work up  Patient Goals and CMS Choice Patient states their goals for this hospitalization and ongoing recovery are:: Get better CMS Medicare.gov Compare Post Acute Care list provided to:: Patient Represenative (must comment) Jayzeon Whoolery (wife)) Choice offered to / list presented to : Spouse  Expected Discharge Plan and Services Expected Discharge Plan: Home/Self Care In-house Referral: Clinical Social Work Discharge Planning Services: NA Post Acute Care Choice: NA Living arrangements for the past 2 months: Single Family Home              DME Arranged: N/A DME Agency: NA HH Arranged: NA [place] Agency: NA  Prior Living Arrangements/Services Living arrangements for the past 2 months: [place] Lives with:: Spouse Patient language and need for interpreter reviewed:: Yes Do you  feel safe going back to the place where you live?: Yes      Need for Family Participation in Patient Care: Yes (Comment) Care giver support system in place?: Yes (comment) Current home services: DME (Walker, cane) Criminal Activity/Legal Involvement Pertinent to Current Situation/Hospitalization: No - Comment as needed  Activities of Daily Living Home Assistive Devices/Equipment: Environmental consultant (specify type) ADL Screening (condition at time of admission) Patient's cognitive ability adequate to safely complete daily activities?: Yes Is the patient deaf or have difficulty hearing?: No Does the patient have difficulty seeing, even when wearing glasses/contacts?: No Does the patient have difficulty concentrating, remembering, or making decisions?: No Patient able to express need for assistance with ADLs?: Yes Does the patient have difficulty dressing or bathing?: Yes Independently performs ADLs?: No Communication: Independent Dressing (OT): Needs assistance Is this a change from baseline?: Pre-admission baseline Grooming: Needs assistance Feeding: Independent Bathing: Needs assistance Is this a change from baseline?: Pre-admission baseline Toileting: Needs assistance Is this a change from baseline?: Pre-admission baseline In/Out Bed: Needs assistance Is this a change from baseline?: Pre-admission baseline Walks in Home: Needs assistance Is this a change from baseline?: Pre-admission baseline Does the patient have difficulty walking or climbing stairs?: Yes Weakness of Legs: Both Weakness of Arms/Hands: None  Permission Sought/Granted Permission sought to share information with : Family Supports  Emotional Assessment Appearance:: Appears stated age Orientation: : Oriented to Self,Oriented to Place,Oriented to Situation Alcohol / Substance Use: Not Applicable Psych Involvement: No (comment)  Admission diagnosis:  Cough [R05.9] Acute CHF (congestive heart failure) ([place])  [I50.9] Generalized weakness [R53.1] LFT elevation [R79.89] Community  acquired pneumonia, unspecified laterality [J18.9] Patient Active Problem List   Diagnosis Date Noted  . SOB (shortness of breath) 03/29/2020  . Hypothyroidism 03/29/2020  . Generalized weakness 03/29/2020  . Edema of both legs 03/29/2020  . Pulmonary edema 03/29/2020  . CHF (congestive heart failure) (HCC) 03/29/2020  . Acute CHF (congestive heart failure) (HCC) 03/29/2020  . LFT elevation   . ARF (acute renal failure) (HCC) 01/11/2019  . Syncope 01/10/2019  . Acute lower UTI 01/10/2019  . AKI (acute kidney injury) (HCC) 01/10/2019  . History of bladder cancer, 2016 03/10/2015   PCP:  Wilson Singer, MD Pharmacy:   [facility] - [place], [place] - [address] [place] [address] Phone: [phone] Fax: [phone]  [facility] [phone] - [place], [place] - [address] AT [facility] [address] [place] [address] Phone: [phone] Fax: [phone]  Readmission Risk Interventions No flowsheet data found.

## 2020-03-30 NOTE — Evaluation (Signed)
Physical Therapy Evaluation Patient Details Name: Gabriel Spencer MRN: [id number] DOB: 08/24/1931 Today's Date: 03/30/2020   History of Present Illness  HPI: The patient is a 87 y.o. year-old w/ hx of macular degeneration, glaucoma, hypothryoid, DJD, UTI, presented to ED this am w/ 2 wks hx of gen'd weakness.  Recently treated in OP setting for UTI.  Trouble getting up to stand and walking around the house. Dry mouth , poor appetite, no CP, +some cough and SOB recently as well. No fevers. +vaccinated.  ED w/u showed afeb, VSS, SpO2 95%. CXR showed possible CHF/ edema. BNP wnl, ED gave abx for suspected CAP and 1 L bolus.  Asked to see for admission.    Clinical Impression  Patient requires increased time for sitting up at bedside, unable to complete sit to stands without AD due to weakness, required use of RW and Mod assist to for standing, limited to a few unsteady labored steps at bedside due to fatigue and tolerated sitting up in chair after therapy - nursing staff notified.  Patient will benefit from continued physical therapy in hospital and recommended venue below to increase strength, balance, endurance for safe ADLs and gait.    Follow Up Recommendations SNF    Equipment Recommendations  None recommended by PT    Recommendations for Other Services       Precautions / Restrictions Precautions Precautions: Fall Restrictions Weight Bearing Restrictions: No      Mobility  Bed Mobility Overal bed mobility: Needs Assistance Bed Mobility: Supine to Sit     Supine to sit: Min guard;Min assist     General bed mobility comments: increased time, labored movement    Transfers Overall transfer level: Needs assistance Equipment used: Rolling walker (2 wheeled) Transfers: Sit to/from [facility] Sit to Stand: Mod assist Stand pivot transfers: Mod assist       General transfer comment: slow labored movement, unable to sit to stand without AD due to  weakness  Ambulation/Gait Ambulation/Gait assistance: Mod assist Gait Distance (Feet): 5 Feet Assistive device: Rolling walker (2 wheeled) Gait Pattern/deviations: Decreased step length - right;Decreased step length - left;Decreased stride length Gait velocity: slow   General Gait Details: limited to 5-6 slow labored steps at bedside due to generalized weakness, poor standing balance  Stairs            Wheelchair Mobility    Modified Rankin (Stroke Patients Only)       Balance Overall balance assessment: Needs assistance Sitting-balance support: Feet supported;No upper extremity supported Sitting balance-Leahy Scale: Fair Sitting balance - Comments: fair/good seated at EOB   Standing balance support: During functional activity;Bilateral upper extremity supported Standing balance-Leahy Scale: Poor Standing balance comment: fair/poor using RW                             Pertinent Vitals/Pain Pain Assessment: No/denies pain    Home Living Family/patient expects to be discharged to:: Private residence Living Arrangements: Spouse/significant other;Children Available Help at Discharge: Family;Available PRN/intermittently Type of Home: House Home Access: Stairs to enter Entrance Stairs-Rails: Right;Left;Can reach both Entrance Stairs-Number of Steps: 5 Home Layout: Two level Home Equipment: Walker - 2 wheels;Cane - single point;Grab bars - tub/shower      Prior Function Level of Independence: Independent with assistive device(s)         Comments: household and short distance community ambulator using RW PRN     Hand Dominance  Extremity/Trunk Assessment   Upper Extremity Assessment Upper Extremity Assessment: Generalized weakness    Lower Extremity Assessment Lower Extremity Assessment: Generalized weakness    Cervical / Trunk Assessment Cervical / Trunk Assessment: Normal  Communication   Communication: No difficulties   Cognition Arousal/Alertness: Awake/alert Behavior During Therapy: WFL for tasks assessed/performed Overall Cognitive Status: Within Functional Limits for tasks assessed                                        General Comments      Exercises     Assessment/Plan    PT Assessment Patient needs continued PT services  PT Problem List Decreased strength;Decreased activity tolerance;Decreased balance;Decreased mobility       PT Treatment Interventions DME instruction;Gait training;Stair training;Functional mobility training;Therapeutic activities;Therapeutic exercise;Patient/family education;Balance training    PT Goals (Current goals can be found in the Care Plan section)  Acute Rehab PT Goals Patient Stated Goal: return home with family to assist PT Goal Formulation: With patient Time For Goal Achievement: 04/13/20 Potential to Achieve Goals: Good    Frequency Min 3X/week   Barriers to discharge        Co-evaluation               AM-PAC PT "6 Clicks" Mobility  Outcome Measure Help needed turning from your back to your side while in a flat bed without using bedrails?: A Little Help needed moving from lying on your back to sitting on the side of a flat bed without using bedrails?: A Little Help needed moving to and from a bed to a chair (including a wheelchair)?: A Lot Help needed standing up from a chair using your arms (e.g., wheelchair or bedside chair)?: A Lot Help needed to walk in hospital room?: A Lot Help needed climbing 3-5 steps with a railing? : Total 6 Click Score: 13    End of Session   Activity Tolerance: Patient tolerated treatment well;Other (comment) Patient left: in chair;with call bell/phone within reach Nurse Communication: Mobility status PT Visit Diagnosis: Unsteadiness on feet (R26.81);Other abnormalities of gait and mobility (R26.89);Muscle weakness (generalized) (M62.81)    Time: 0160-1093 PT Time Calculation (min) (ACUTE  ONLY): 28 min   Charges:   PT Evaluation $PT Eval Moderate Complexity: 1 Mod PT Treatments $Therapeutic Activity: 23-37 mins        4:27 PM, 03/30/20 Ocie Bob, MPT Physical Therapist with [facility] [phone] office [phone] mobile phone

## 2020-03-31 DIAGNOSIS — E663 Overweight: Secondary | ICD-10-CM | POA: Diagnosis not present

## 2020-03-31 DIAGNOSIS — I5033 Acute on chronic diastolic (congestive) heart failure: Secondary | ICD-10-CM | POA: Diagnosis not present

## 2020-03-31 DIAGNOSIS — R531 Weakness: Secondary | ICD-10-CM | POA: Diagnosis not present

## 2020-03-31 DIAGNOSIS — R600 Localized edema: Secondary | ICD-10-CM | POA: Diagnosis not present

## 2020-03-31 LAB — BASIC METABOLIC PANEL
Anion gap: 9 (ref 5–15)
BUN: 18 mg/dL (ref 8–23)
CO2: 27 mmol/L (ref 22–32)
Calcium: 8.3 mg/dL — ABNORMAL LOW (ref 8.9–10.3)
Chloride: 101 mmol/L (ref 98–111)
Creatinine, Ser: 1 mg/dL (ref 0.61–1.24)
GFR, Estimated: >60 mL/min (ref >60)
Glucose, Bld: 85 mg/dL (ref 70–99)
Potassium: 3.9 mmol/L (ref 3.5–5.1)
Sodium: 137 mmol/L (ref 135–145)

## 2020-03-31 LAB — SARS CORONAVIRUS 2 (TAT 6-24 HRS): SARS Coronavirus 2: NEGATIVE

## 2020-03-31 MED ORDER — FUROSEMIDE 20 MG PO TABS: 20.0000 mg | ORAL_TABLET | Freq: Every day | ORAL | 11 refills | Status: DC

## 2020-03-31 MED ORDER — SODIUM CHLORIDE 0.9 % IV SOLN: INTRAVENOUS | Status: DC

## 2020-03-31 NOTE — Discharge Summary (Addendum)
Discharge Summary  Gabriel Spencer [id number] DOB: May 20, 1931  PCP: Doree Albee, MD  Admit date: 03/29/2020 Anticipated discharge date: 04/01/2020  Time spent: 25 minutes  Recommendations for Outpatient Follow-up:  1. New medication: Lasix 20 mg p.o. day 2. Patient will go to the [facility] for short-term skilled nursing  Discharge Diagnoses:  Active Hospital Problems   Diagnosis Date Noted  . Acute on chronic diastolic CHF (congestive heart failure) ([place]) 03/29/2020  . Pressure injury of skin 03/30/2020  . Glaucoma 03/30/2020  . Overweight (BMI 25.0-29.9) 03/30/2020  . SOB (shortness of breath) 03/29/2020  . Hypothyroidism 03/29/2020  . Generalized weakness 03/29/2020  . Edema of both legs 03/29/2020  . Pulmonary edema 03/29/2020  . History of bladder cancer, 2016 03/10/2015    Resolved Hospital Problems  No resolved problems to display.    Discharge Condition: Improved, being discharged to skilled nursing  Diet recommendation: Heart healthy  Vitals:   03/31/20 0508 03/31/20 1359  BP: (!) 96/48 (!) 87/56  Pulse: 82 72  Resp: 20 17  Temp: (!) 97.5 F (36.4 C) 97.6 F (36.4 C)  SpO2: 97% 98%    History of present illness:  Patient is an 87 year old male past medical history of hypothyroidism and glaucoma who presented to the emergency room on 1/4 with complaints of shortness of breath and nonproductive cough which have been going on for several days.  Chest x-ray revealed bilateral pulmonary edema consistent with CHF.  Patient has no previous history.  Started on IV Lasix and admitted to the hospitalist service.  Hospital Course:  Principal Problem:   Acute on chronic diastolic CHF (congestive heart failure) [facility]): Patient has diuresed over 2-1/2 L and weight is down 6 pounds from admission.  Blood pressure now slightly soft so he likely has been fully diuresed at this point.  Given softer blood pressures, concerned about putting him on an ACE inhibitor.  We will  go with gentle Lasix 20 mg daily long-term.  Echocardiogram done 1/5 noted preserved ejection fraction but unable to comment on diastolic dysfunction.  Considering his response to Lasix, he most certainly does have diastolic heart failure Active Problems:   History of bladder cancer, 2016    Hypothyroidism: Continue Synthroid.    Generalized weakness: Deconditioning brought on by CHF.  Seen by PT who recommended skilled nursing  Pressure ulcer of right buttock: Stage II.  Present on admission.  Frequent turning and should improve with increased mobility    Glaucoma: Continue home eyedrops.    Overweight (BMI 25.0-29.9): Patient meets criteria with BMI greater than 25  Consultants:  None  Procedures:  Echocardiogram done 1/5: Preserved ejection fraction unable to comment on diastolic function  Discharge Exam: BP (!) 87/56 (BP Location: Right Arm)   Pulse 72   Temp 97.6 F (36.4 C) (Oral)   Resp 17   Ht 5\' 11"  (1.803 m)   Wt 91.1 kg   SpO2 98%   BMI 28.01 kg/m   General: Fatigued, oriented x2, no acute distress Cardiovascular: Regular rate and rhythm, S1-S2 Respiratory: Clear to auscultation bilaterally  Discharge Instructions You were cared for by a hospitalist during your hospital stay. If you have any questions about your discharge medications or the care you received while you were in the hospital after you are discharged, you can call the unit and asked to speak with the hospitalist on call if the hospitalist that took care of you is not available. Once you are discharged, your primary care  physician will handle any further medical issues. Please note that NO REFILLS for any discharge medications will be authorized once you are discharged, as it is imperative that you return to your primary care physician (or establish a relationship with a primary care physician if you do not have one) for your aftercare needs so that they can reassess your need for medications and monitor  your lab values.   Allergies as of 03/31/2020      Reactions   Keflex [cephalexin] Hives      Medication List    TAKE these medications   albuterol 108 (90 Base) MCG/ACT inhaler Commonly known as: VENTOLIN HFA Inhale 2 puffs into the lungs every 4 (four) hours as needed for wheezing or shortness of breath.   allopurinol 100 MG tablet Commonly known as: ZYLOPRIM Take 100 mg by mouth daily.   brimonidine 0.15 % ophthalmic solution Commonly known as: ALPHAGAN Place 1 drop into both eyes 2 (two) times daily.   furosemide 20 MG tablet Commonly known as: Lasix Take 1 tablet (20 mg total) by mouth daily.   ketotifen 0.025 % ophthalmic solution Commonly known as: ZADITOR Place 1 drop into both eyes 2 (two) times daily.   latanoprost 0.005 % ophthalmic solution Commonly known as: XALATAN Place 1 drop into both eyes at bedtime.   levothyroxine 50 MCG tablet Commonly known as: SYNTHROID Take 50 mcg by mouth daily.   meclizine 12.5 MG tablet Commonly known as: ANTIVERT Take 12.5 mg by mouth 2 (two) times daily.   PRESERVISION AREDS PO Take 1 tablet by mouth 2 (two) times daily.   Vitamin D2 10 MCG (400 UNIT) Tabs Take 1 tablet by mouth daily.      Allergies  Allergen Reactions  . Keflex [Cephalexin] Hives    Contact information for after-discharge care    Destination    [facility] Preferred SNF .   Service: Skilled Nursing Contact information: [address] [place] [address] [phone]                   The results of significant diagnostics from this hospitalization (including imaging, microbiology, ancillary and laboratory) are listed below for reference.    Significant Diagnostic Studies: DG Chest Port 1 View  Result Date: 03/29/2020 CLINICAL DATA:  Cough. EXAM: PORTABLE CHEST 1 VIEW COMPARISON:  Chest x-ray 01/05/2017. FINDINGS: Cardiomegaly. Diffuse bilateral pulmonary infiltrates/edema bilateral pleural  effusions. Findings most consistent CHF. Bilateral pneumonia cannot be excluded. No pneumothorax. IMPRESSION: Cardiomegaly with diffuse bilateral pulmonary infiltrates/edema and bilateral pleural effusions. Findings most consistent CHF. Bilateral pneumonia cannot be excluded. Electronically Signed   By: Maisie Fus  Register   On: 03/29/2020 12:32   ECHOCARDIOGRAM COMPLETE  Result Date: 03/30/2020    ECHOCARDIOGRAM REPORT   Patient Name:   LEALON VANPUTTEN Date of Exam: 03/30/2020 Medical Rec #:  [id number]    Height:       71.0 in Accession #:    [id number]   Weight:       206.6 lb Date of Birth:  [birth year]    BSA:          2.138 m Patient Age:    88 years     BP:           118/68 mmHg Patient Gender: M            HR:           73 bpm. [facility] Procedure: 2D Echo,  Cardiac Doppler and Color Doppler Indications:    Acute CHF (congestive heart failure) ([place]) [id number]  History:        Patient has prior history of Echocardiogram examinations, most                 recent 01/11/2019. CHF. AKI (acute kidney injury, Bladder                 cancer.  Sonographer:    Alvino Chapel RCS Referring Phys: [id number] [place]  1. Left ventricular ejection fraction, by estimation, is 70 to 75%. The left ventricle has hyperdynamic function. The left ventricle has no regional wall motion abnormalities. There is mild left ventricular hypertrophy. Left ventricular diastolic parameters are indeterminate.  2. Right ventricular systolic function is normal. The right ventricular size is normal. There is mildly elevated pulmonary artery systolic pressure. The estimated right ventricular systolic pressure is [id number] mmHg.  3. Left atrial size was mildly dilated.  4. The mitral valve is grossly normal. Trivial mitral valve regurgitation. Moderate to severe mitral annular calcification.  5. The aortic valve is tricuspid. There is mild calcification of the aortic valve. Aortic valve regurgitation is not visualized.  6. The  inferior vena cava is dilated in size with >50% respiratory variability, suggesting right atrial pressure of 8 mmHg. FINDINGS  Left Ventricle: Left ventricular ejection fraction, by estimation, is 70 to 75%. The left ventricle has hyperdynamic function. The left ventricle has no regional wall motion abnormalities. The left ventricular internal cavity size was normal in size. There is mild left ventricular hypertrophy. Left ventricular diastolic parameters are indeterminate. Right Ventricle: The right ventricular size is normal. No increase in right ventricular wall thickness. Right ventricular systolic function is normal. There is mildly elevated pulmonary artery systolic pressure. The tricuspid regurgitant velocity is 2.88  m/s, and with an assumed right atrial pressure of 8 mmHg, the estimated right ventricular systolic pressure is [id number] mmHg. Left Atrium: Left atrial size was mildly dilated. Right Atrium: Right atrial size was normal in size. Pericardium: There is no evidence of pericardial effusion. Mitral Valve: The mitral valve is grossly normal. Moderate to severe mitral annular calcification. Trivial mitral valve regurgitation. Tricuspid Valve: The tricuspid valve is grossly normal. Tricuspid valve regurgitation is mild. Aortic Valve: The aortic valve is tricuspid. There is mild calcification of the aortic valve. There is mild aortic valve annular calcification. Aortic valve regurgitation is not visualized. Pulmonic Valve: The pulmonic valve was grossly normal. Pulmonic valve regurgitation is trivial. Aorta: The aortic root is normal in size and structure. Venous: The inferior vena cava is dilated in size with greater than 50% respiratory variability, suggesting right atrial pressure of 8 mmHg. IAS/Shunts: No atrial level shunt detected by color flow Doppler.  LEFT VENTRICLE PLAX 2D LVIDd:         4.50 cm  Diastology LVIDs:         2.80 cm  LV e' medial:    7.40 cm/s LV PW:         1.10 cm  LV E/e' medial:   11.6 LV IVS:        0.90 cm  LV e' lateral:   8.92 cm/s LVOT diam:     2.00 cm  LV E/e' lateral: 9.6 LV SV:         100 LV SV Index:   47 LVOT Area:     3.14 cm  RIGHT VENTRICLE RV S prime:     13.10 cm/s TAPSE (  M-mode): 2.9 cm LEFT ATRIUM             Index       RIGHT ATRIUM           Index LA diam:        4.70 cm 2.20 cm/m  RA Area:     19.10 cm LA Vol (A2C):   73.6 ml 34.43 ml/m RA Volume:   52.80 ml  24.70 ml/m LA Vol (A4C):   70.6 ml 33.03 ml/m LA Biplane Vol: 76.3 ml 35.69 ml/m  AORTIC VALVE LVOT Vmax:   123.00 cm/s LVOT Vmean:  83.550 cm/s LVOT VTI:    0.318 m  AORTA Ao Root diam: 3.30 cm MITRAL VALVE                TRICUSPID VALVE MV Area (PHT): 2.08 cm     TR Peak grad:   33.2 mmHg MV Decel Time: 366 msec     TR Vmax:        288.00 cm/s MV E velocity: 85.60 cm/s MV A velocity: 103.60 cm/s  SHUNTS MV E/A ratio:  0.83         Systemic VTI:  0.32 m                             Systemic Diam: 2.00 cm Rozann Lesches MD Electronically signed by Rozann Lesches MD Signature Date/Time: 03/30/2020/11:01:53 AM    Final    US Abdomen Limited RUQ (LIVER/GB)  Result Date: 03/29/2020 CLINICAL DATA:  Elevated liver function tests. Status post prior cholecystectomy. EXAM: ULTRASOUND ABDOMEN LIMITED RIGHT UPPER QUADRANT COMPARISON:  11/28/2011 FINDINGS: Gallbladder: Surgically absent Common bile duct: Diameter: 4 mm. Liver: The liver is shrunken, nodular and grossly cirrhotic. Parenchyma is very heterogeneous. No gross lesion or intrahepatic biliary ductal dilatation. The portal vein is not very well visualized. Color Doppler flow is demonstrated in the main portal vein with limited evaluation of the intrahepatic portal veins. Other: Moderate ascites is present surrounding the liver in the right upper quadrant. IMPRESSION: 1. The liver demonstrates evidence of advanced cirrhosis by ultrasound. 2. Moderate ascites surrounding the liver in the right upper quadrant. Electronically Signed   By: Aletta Edouard M.D.    On: 03/29/2020 17:01    Microbiology: Recent Results (from the past 240 hour(s))  Resp Panel by RT-PCR (Flu A&B, Covid) Nasopharyngeal Swab     Status: None   Collection Time: 03/29/20 12:00 PM   Specimen: Nasopharyngeal Swab; Nasopharyngeal(NP) swabs in vial transport medium  Result Value Ref Range Status   SARS Coronavirus 2 by RT PCR NEGATIVE NEGATIVE Final    Comment: (NOTE) SARS-CoV-2 target nucleic acids are NOT DETECTED.  The SARS-CoV-2 RNA is generally detectable in upper respiratory specimens during the acute phase of infection. The lowest concentration of SARS-CoV-2 viral copies this assay can detect is 138 copies/mL. A negative result does not preclude SARS-Cov-2 infection and should not be used as the sole basis for treatment or other patient management decisions. A negative result may occur with  improper specimen collection/handling, submission of specimen other than nasopharyngeal swab, presence of viral mutation(s) within the areas targeted by this assay, and inadequate number of viral copies(<138 copies/mL). A negative result must be combined with clinical observations, patient history, and epidemiological information. The expected result is Negative.  Fact Sheet for Patients:  EntrepreneurPulse.com.au  Fact Sheet for Healthcare Providers:  IncredibleEmployment.be  This test is no t yet  approved or cleared by the [place] and  has been authorized for detection and/or diagnosis of SARS-CoV-2 by FDA under an Emergency Use Authorization (EUA). This EUA will remain  in effect (meaning this test can be used) for the duration of the COVID-19 declaration under Section 564(b)(1) of the Act, 21 U.S.C.section 360bbb-3(b)(1), unless the authorization is terminated  or revoked sooner.       Influenza A by PCR NEGATIVE NEGATIVE Final   Influenza B by PCR NEGATIVE NEGATIVE Final    Comment: (NOTE) The Xpert Xpress  SARS-CoV-2/FLU/RSV plus assay is intended as an aid in the diagnosis of influenza from Nasopharyngeal swab specimens and should not be used as a sole basis for treatment. Nasal washings and aspirates are unacceptable for Xpert Xpress SARS-CoV-2/FLU/RSV testing.  Fact Sheet for Patients: EntrepreneurPulse.com.au  Fact Sheet for Healthcare Providers: IncredibleEmployment.be  This test is not yet approved or cleared by the [place] FDA and has been authorized for detection and/or diagnosis of SARS-CoV-2 by FDA under an Emergency Use Authorization (EUA). This EUA will remain in effect (meaning this test can be used) for the duration of the COVID-19 declaration under Section 564(b)(1) of the Act, 21 U.S.C. section 360bbb-3(b)(1), unless the authorization is terminated or revoked.  Performed at [facility], [address] [place], [place] [address]      Labs: Basic Metabolic Panel: Recent Labs  Lab 03/29/20 1151 03/30/20 0612 03/31/20 0745  NA 137 138 137  K 4.1 3.7 3.9  CL 106 104 101  CO2 24 25 27   GLUCOSE 92 82 85  BUN 19 19 18   CREATININE 1.00 0.95 1.00  CALCIUM 8.7* 8.2* 8.3*   Liver Function Tests: Recent Labs  Lab 03/29/20 1151  AST 52*  ALT 21  ALKPHOS 118  BILITOT 3.2*  PROT 6.1*  ALBUMIN 2.3*   No results for input(s): LIPASE, AMYLASE in the last 168 hours. No results for input(s): AMMONIA in the last 168 hours. CBC: Recent Labs  Lab 03/29/20 1151  WBC 6.1  NEUTROABS 4.0  HGB 11.0*  HCT 33.0*  MCV 100.0  PLT 121*   Cardiac Enzymes: No results for input(s): CKTOTAL, CKMB, CKMBINDEX, TROPONINI in the last 168 hours. BNP: BNP (last 3 results) Recent Labs    03/29/20 1151  BNP 129.0*    ProBNP (last 3 results) No results for input(s): PROBNP in the last 8760 hours.  CBG: Recent Labs  Lab 03/29/20 2213  GLUCAP 99       Signed:  Annita Brod, MD [facility] 03/31/2020, 3:16  PM

## 2020-03-31 NOTE — Plan of Care (Signed)

## 2020-03-31 NOTE — TOC Progression Note (Addendum)
Transition of Care [facility]) - Progression Note   Patient Details  Name: Gabriel Spencer MRN: [id number] Date of Birth: October 21, 1931  Transition of Care [facility]) CM/SW Contact  Ewing Schlein, LCSW Phone Number: 03/31/2020, 11:22 AM  Clinical Narrative: PT evaluation recommended SNF. CSW spoke with patient and wife. Both are agreeable to SNF. Family reported patient is vaccinated for COVID and [facility] would have a copy of the vaccine record. CSW called [facility] and spoke with Malachi Bonds to get a copy of patient's COVID vaccine record. CSW received vaccine record. FL2 completed and PASRR received. Initial referral faxed out. CSW called [facility] Him to start insurance authorization. Reference ID is: [id number]. Clinicals faxed to [facility]. TOC awaiting bed offers.  Addendum: CSW received call from [facility] with [facility] and [facility] can make bed offer. CSW spoke with patient and wife. Both are agreeable to [facility]. CSW updated [facility] with bed offer. CSW notified Kerri at [facility] of bed acceptance.  Expected Discharge Plan: Skilled Nursing Facility Barriers to Discharge: Continued Medical Work up  Expected Discharge Plan and Services Expected Discharge Plan: Skilled Nursing Facility In-house Referral: Clinical Social Work Discharge Planning Services: NA Post Acute Care Choice: Skilled Nursing Facility Living arrangements for the past 2 months: Single Family Home             DME Arranged: N/A DME Agency: NA HH Arranged: NA HH Agency: NA  Readmission Risk Interventions No flowsheet data found.

## 2020-03-31 NOTE — [place] FL2 (Signed)
[place] MEDICAID FL2 LEVEL OF CARE SCREENING TOOL     IDENTIFICATION  Patient Name: Gabriel Spencer Birthdate: 17-Sep-1931 Sex: male Admission Date (Current Location): 03/29/2020  [facility] and [place] Number:  [employer] and Address:  [facility],  [address], [place] [address]      Provider Number: [id number]  Attending Physician Name and Address:  Hollice Espy, MD  Relative Name and Phone Number:  Paulino Cork (wife) Ph: [phone]    Current Level of Care: Hospital Recommended Level of Care: Skilled Nursing Facility Prior Approval Number:    Date Approved/Denied:   PASRR Number: [id number]  Discharge Plan: SNF    Current Diagnoses: Patient Active Problem List   Diagnosis Date Noted  . Pressure injury of skin 03/30/2020  . Glaucoma 03/30/2020  . Overweight (BMI 25.0-29.9) 03/30/2020  . SOB (shortness of breath) 03/29/2020  . Hypothyroidism 03/29/2020  . Generalized weakness 03/29/2020  . Edema of both legs 03/29/2020  . Pulmonary edema 03/29/2020  . Chronic diastolic CHF (congestive heart failure) (HCC) 03/29/2020  . Acute on chronic diastolic CHF (congestive heart failure) (HCC) 03/29/2020  . LFT elevation   . Syncope 01/10/2019  . History of bladder cancer, 2016 03/10/2015    Orientation RESPIRATION BLADDER Height & Weight     Self,Time,Situation,Place  Normal Incontinent Weight: 200 lb 13.4 oz (91.1 kg) Height:  5\' 11"  (180.3 cm)  BEHAVIORAL SYMPTOMS/MOOD NEUROLOGICAL BOWEL NUTRITION STATUS      Incontinent Diet (Heart healthy)  AMBULATORY STATUS COMMUNICATION OF NEEDS Skin   Limited Assist Verbally Skin abrasions,Other (Comment) (Abrasion: right lower leg; has pressure injury)                       Personal Care Assistance Level of Assistance  Bathing,Feeding,Dressing Bathing Assistance: Limited assistance Feeding assistance: Independent Dressing Assistance: Limited assistance     Functional Limitations Info   Sight,Hearing,Speech Sight Info: Impaired (Patient has glaucoma) Hearing Info: Adequate Speech Info: Adequate    SPECIAL CARE FACTORS FREQUENCY  PT (By licensed PT)     PT Frequency: 5x's/week              Contractures Contractures Info: Not present    Additional Factors Info  Code Status,Allergies Code Status Info: DNR Allergies Info: Keflex (Cephalexin)           Current Medications (03/31/2020):  This is the current hospital active medication list Current Facility-Administered Medications  Medication Dose Route Frequency Provider Last Rate Last Admin  . 0.9 %  sodium chloride infusion  250 mL Intravenous PRN 05/29/2020, MD      . acetaminophen (TYLENOL) tablet 650 mg  650 mg Oral Q4H PRN Delano Metz, MD   650 mg at 03/31/20 0235  . albuterol (VENTOLIN HFA) 108 (90 Base) MCG/ACT inhaler 2 puff  2 puff Inhalation Q4H PRN 05/29/20, MD      . allopurinol (ZYLOPRIM) tablet 100 mg  100 mg Oral Daily Delano Metz, MD   100 mg at 03/31/20 1002  . brimonidine (ALPHAGAN) 0.15 % ophthalmic solution 1 drop  1 drop Both Eyes BID 05/29/20, MD   1 drop at 03/31/20 1001  . cholecalciferol (VITAMIN D3) tablet 400 Units  400 Units Oral Daily 05/29/20, MD   400 Units at 03/31/20 1002  . enoxaparin (LOVENOX) injection 40 mg  40 mg Subcutaneous Q24H 05/29/20, MD   40 mg at 03/30/20 2154  . furosemide (LASIX) injection 20  mg  20 mg Intravenous Q8H Roney Jaffe, MD   20 mg at 03/31/20 0539  . ketotifen (ZADITOR) 0.025 % ophthalmic solution 1 drop  1 drop Both Eyes BID Roney Jaffe, MD   1 drop at 03/31/20 1001  . latanoprost (XALATAN) 0.005 % ophthalmic solution 1 drop  1 drop Both Eyes QHS Roney Jaffe, MD   1 drop at 03/30/20 2154  . levothyroxine (SYNTHROID) tablet 50 mcg  50 mcg Oral QAC breakfast Roney Jaffe, MD   50 mcg at 03/31/20 0538  . multivitamin-lutein (OCUVITE-LUTEIN) capsule 1 capsule  1 capsule Oral BID Roney Jaffe, MD   1  capsule at 03/31/20 1002  . ondansetron (ZOFRAN) injection 4 mg  4 mg Intravenous Q6H PRN Roney Jaffe, MD      . sodium chloride flush (NS) 0.9 % injection 3 mL  3 mL Intravenous Q12H Roney Jaffe, MD   3 mL at 03/31/20 1002  . sodium chloride flush (NS) 0.9 % injection 3 mL  3 mL Intravenous PRN Roney Jaffe, MD         Discharge Medications: Please see discharge summary for a list of discharge medications.  Relevant Imaging Results:  Relevant Lab Results:   Additional Information SSN: [id number]  Sherie Don, LCSW

## 2020-04-01 ENCOUNTER — Inpatient Hospital Stay
Admission: RE | Admit: 2020-04-01 | Discharge: 2020-04-10 | Disposition: A | Discharge status: Other Acute Inpatient Hospital | Payer: Medicare PPO | Source: Ambulatory Visit | Attending: Internal Medicine | Admitting: Internal Medicine

## 2020-04-01 LAB — BASIC METABOLIC PANEL
Anion gap: 6 (ref 5–15)
BUN: 20 mg/dL (ref 8–23)
CO2: 28 mmol/L (ref 22–32)
Calcium: 8.3 mg/dL — ABNORMAL LOW (ref 8.9–10.3)
Chloride: 102 mmol/L (ref 98–111)
Creatinine, Ser: 1.09 mg/dL (ref 0.61–1.24)
GFR, Estimated: >60 mL/min (ref >60)
Glucose, Bld: 81 mg/dL (ref 70–99)
Potassium: 4 mmol/L (ref 3.5–5.1)
Sodium: 136 mmol/L (ref 135–145)

## 2020-04-01 NOTE — TOC Transition Note (Signed)
Transition of Care [facility]) - CM/SW Discharge Note  Patient Details  Name: Gabriel Spencer MRN: [id number] Date of Birth: 1931-03-30  Transition of Care [facility]) CM/SW Contact:  Sherie Don, LCSW Phone Number: 04/01/2020, 9:43 AM  Clinical Narrative: Patient has received insurance authorization for SNF. Authorization ID is: [id number]. Patient is approved to start 04/01/20 and next review date is 04/05/20. Clinicals will be faxed to [employer]. CSW confirmed with Marianna Fuss at [facility] that patient will go to room 153 once the patient's room is ready. Staff from [facility] will transport the patient. RN updated. Discharge summary and discharge orders faxed in [place] to [facility]. COVID vaccine record provided to RN station. CSW notified wife of transfer. TOC signing off.  Final next level of care: Skilled Nursing Facility Barriers to Discharge: Barriers Resolved  Patient Goals and CMS Choice Patient states their goals for this hospitalization and ongoing recovery are:: Get better CMS Medicare.gov Compare Post Acute Care list provided to:: Patient Gabriel Spencer (wife)) Choice offered to / list presented to : Patient,Spouse  Discharge Placement PASRR number recieved: 03/31/20         Patient chooses bed at: [facility] Patient to be transferred to facility by: Staff Name of family member notified: Gabriel Spencer Patient and family notified of of transfer: 04/01/20  Discharge Plan and Services In-house Referral: Clinical Social Work Discharge Planning Services: NA Post Acute Care Choice: [place]          DME Arranged: N/A DME Agency: NA HH Arranged: NA [place] Agency: NA  Readmission Risk Interventions No flowsheet data found.

## 2020-04-01 NOTE — Discharge Summary (Signed)
Discharge Summary  Gabriel Spencer KGU:[id number] DOB: [birth year]  PCP: Doree Albee, MD  Admit date: 03/29/2020 Discharge date: 04/01/2020  Time spent: 25 minutes  Recommendations for Outpatient Follow-up:  1. New medication: Lasix 20 mg p.o. day 2. [facility] will go to [facility] for short-term skilled nursing  Discharge Diagnoses:  Active Hospital Problems   Diagnosis Date Noted  . Acute on chronic diastolic CHF (congestive heart failure) ([place]) 03/29/2020  . Pressure injury of skin 03/30/2020  . Glaucoma 03/30/2020  . Overweight (BMI 25.0-29.9) 03/30/2020  . SOB (shortness of breath) 03/29/2020  . Hypothyroidism 03/29/2020  . Generalized weakness 03/29/2020  . Edema of both legs 03/29/2020  . Pulmonary edema 03/29/2020  . History of bladder cancer, 2016 03/10/2015    Resolved Hospital Problems  No resolved problems to display.    Discharge Condition: Improved, being discharged to skilled nursing  Diet recommendation: Heart healthy  Vitals:   03/31/20 2135 04/01/20 0518  BP:  (!) 105/54  Pulse:  84  Resp:  18  Temp:  97.6 F (36.4 C)  SpO2: 93% 96%    History of present illness:  Patient is an 87 year old male past medical history of hypothyroidism and glaucoma who presented to the emergency room on 1/4 with complaints of shortness of breath and nonproductive cough which have been going on for several days.  Chest x-ray revealed bilateral pulmonary edema consistent with CHF.  Patient has no previous history.  Started on IV Lasix and admitted to the hospitalist service.  Hospital Course:  Principal Problem:   Acute on chronic diastolic CHF (congestive heart failure) [facility]): Patient has diuresed over 2-1/2 L and weight is down 6 pounds from admission.  Blood pressure now slightly soft so he likely has been fully diuresed at this point.  Given softer blood pressures, concerned about putting him on an ACE inhibitor.  We will go with gentle Lasix 20 mg daily long-term.   Echocardiogram done 1/5 noted preserved ejection fraction but unable to comment on diastolic dysfunction.  Considering his response to Lasix, he most certainly does have diastolic heart failure Active Problems:   History of bladder cancer, 2016    Hypothyroidism: Continue Synthroid.    Generalized weakness: Deconditioning brought on by CHF.  Seen by PT who recommended skilled nursing  Pressure ulcer of right buttock: Stage II.  Present on admission.  Frequent turning and should improve with increased mobility    Glaucoma: Continue home eyedrops.    Overweight (BMI 25.0-29.9): Patient meets criteria with BMI greater than 25  Consultants:  None  Procedures:  Echocardiogram done 1/5: Preserved ejection fraction unable to comment on diastolic function  Discharge Exam: BP (!) 105/54 (BP Location: Right Arm)   Pulse 84   Temp 97.6 F (36.4 C) (Oral)   Resp 18   Ht 5\' 11"  (1.803 m)   Wt 91.1 kg   SpO2 96%   BMI 28.01 kg/m   General: Fatigued, oriented x2, no acute distress Cardiovascular: Regular rate and rhythm, S1-S2 Respiratory: Clear to auscultation bilaterally  Discharge Instructions You were cared for by a hospitalist during your hospital stay. If you have any questions about your discharge medications or the care you received while you were in the hospital after you are discharged, you can call the unit and asked to speak with the hospitalist on call if the hospitalist that took care of you is not available. Once you are discharged, your primary care physician will handle any further medical  issues. Please note that NO REFILLS for any discharge medications will be authorized once you are discharged, as it is imperative that you return to your primary care physician (or establish a relationship with a primary care physician if you do not have one) for your aftercare needs so that they can reassess your need for medications and monitor your lab values.   Allergies as of  04/01/2020      Reactions   Keflex [cephalexin] Hives      Medication List    TAKE these medications   albuterol 108 (90 Base) MCG/ACT inhaler Commonly known as: VENTOLIN HFA Inhale 2 puffs into the lungs every 4 (four) hours as needed for wheezing or shortness of breath.   allopurinol 100 MG tablet Commonly known as: ZYLOPRIM Take 100 mg by mouth daily.   brimonidine 0.15 % ophthalmic solution Commonly known as: ALPHAGAN Place 1 drop into both eyes 2 (two) times daily.   furosemide 20 MG tablet Commonly known as: Lasix Take 1 tablet (20 mg total) by mouth daily.   ketotifen 0.025 % ophthalmic solution Commonly known as: ZADITOR Place 1 drop into both eyes 2 (two) times daily.   latanoprost 0.005 % ophthalmic solution Commonly known as: XALATAN Place 1 drop into both eyes at bedtime.   levothyroxine 50 MCG tablet Commonly known as: SYNTHROID Take 50 mcg by mouth daily.   meclizine 12.5 MG tablet Commonly known as: ANTIVERT Take 12.5 mg by mouth 2 (two) times daily.   PRESERVISION AREDS PO Take 1 tablet by mouth 2 (two) times daily.   Vitamin D2 10 MCG (400 UNIT) Tabs Take 1 tablet by mouth daily.      Allergies  Allergen Reactions  . Keflex [Cephalexin] Hives    Contact information for after-discharge care    [place] [facility] .   Service: Skilled Nursing Contact information: [address] [place] [phone]                   The results of significant diagnostics from this hospitalization (including imaging, microbiology, ancillary and laboratory) are listed below for reference.    Significant Diagnostic Studies: DG Chest Port 1 View  Result Date: 03/29/2020 CLINICAL DATA:  Cough. EXAM: PORTABLE CHEST 1 VIEW COMPARISON:  Chest x-ray 01/05/2017. FINDINGS: Cardiomegaly. Diffuse bilateral pulmonary infiltrates/edema bilateral pleural effusions. Findings most consistent CHF.  Bilateral pneumonia cannot be excluded. No pneumothorax. IMPRESSION: Cardiomegaly with diffuse bilateral pulmonary infiltrates/edema and bilateral pleural effusions. Findings most consistent CHF. Bilateral pneumonia cannot be excluded. Electronically Signed   By: Marcello Moores  Register   On: 03/29/2020 12:32   ECHOCARDIOGRAM COMPLETE  Result Date: 03/30/2020    ECHOCARDIOGRAM REPORT   Patient Name:   OSWIN STOOP Date of Exam: 03/30/2020 Medical Rec #:  [id number]    Height:       71.0 in Accession #:    [id number]   Weight:       206.6 lb Date of Birth:  [birth year]    BSA:          2.138 m Patient Age:    28 years     BP:           118/68 mmHg Patient Gender: M            HR:           73 bpm. Exam Location:  [facility] Procedure: 2D Echo, Cardiac Doppler and Color Doppler Indications:  Acute CHF (congestive heart failure) ([place]) [008676]  History:        Patient has prior history of Echocardiogram examinations, most                 recent 01/11/2019. CHF. AKI (acute kidney injury, Bladder                 cancer.  Sonographer:    Alvino Chapel RCS Referring Phys: [id number]  1. Left ventricular ejection fraction, by estimation, is 70 to 75%. The left ventricle has hyperdynamic function. The left ventricle has no regional wall motion abnormalities. There is mild left ventricular hypertrophy. Left ventricular diastolic parameters are indeterminate.  2. Right ventricular systolic function is normal. The right ventricular size is normal. There is mildly elevated pulmonary artery systolic pressure. The estimated right ventricular systolic pressure is 19.5 mmHg.  3. Left atrial size was mildly dilated.  4. The mitral valve is grossly normal. Trivial mitral valve regurgitation. Moderate to severe mitral annular calcification.  5. The aortic valve is tricuspid. There is mild calcification of the aortic valve. Aortic valve regurgitation is not visualized.  6. The inferior vena cava is dilated in size with  >50% respiratory variability, suggesting right atrial pressure of 8 mmHg. FINDINGS  Left Ventricle: Left ventricular ejection fraction, by estimation, is 70 to 75%. The left ventricle has hyperdynamic function. The left ventricle has no regional wall motion abnormalities. The left ventricular internal cavity size was normal in size. There is mild left ventricular hypertrophy. Left ventricular diastolic parameters are indeterminate. Right Ventricle: The right ventricular size is normal. No increase in right ventricular wall thickness. Right ventricular systolic function is normal. There is mildly elevated pulmonary artery systolic pressure. The tricuspid regurgitant velocity is 2.88  m/s, and with an assumed right atrial pressure of 8 mmHg, the estimated right ventricular systolic pressure is 09.3 mmHg. Left Atrium: Left atrial size was mildly dilated. Right Atrium: Right atrial size was normal in size. Pericardium: There is no evidence of pericardial effusion. Mitral Valve: The mitral valve is grossly normal. Moderate to severe mitral annular calcification. Trivial mitral valve regurgitation. Tricuspid Valve: The tricuspid valve is grossly normal. Tricuspid valve regurgitation is mild. Aortic Valve: The aortic valve is tricuspid. There is mild calcification of the aortic valve. There is mild aortic valve annular calcification. Aortic valve regurgitation is not visualized. Pulmonic Valve: The pulmonic valve was grossly normal. Pulmonic valve regurgitation is trivial. Aorta: The aortic root is normal in size and structure. Venous: The inferior vena cava is dilated in size with greater than 50% respiratory variability, suggesting right atrial pressure of 8 mmHg. IAS/Shunts: No atrial level shunt detected by color flow Doppler.  LEFT VENTRICLE PLAX 2D LVIDd:         4.50 cm  Diastology LVIDs:         2.80 cm  LV e' medial:    7.40 cm/s LV PW:         1.10 cm  LV E/e' medial:  11.6 LV IVS:        0.90 cm  LV e' lateral:    8.92 cm/s LVOT diam:     2.00 cm  LV E/e' lateral: 9.6 LV SV:         100 LV SV Index:   47 LVOT Area:     3.14 cm  RIGHT VENTRICLE RV S prime:     13.10 cm/s TAPSE (M-mode): 2.9 cm LEFT ATRIUM  Index       RIGHT ATRIUM           Index LA diam:        4.70 cm 2.20 cm/m  RA Area:     19.10 cm LA Vol (A2C):   73.6 ml 34.43 ml/m RA Volume:   52.80 ml  24.70 ml/m LA Vol (A4C):   70.6 ml 33.03 ml/m LA Biplane Vol: 76.3 ml 35.69 ml/m  AORTIC VALVE LVOT Vmax:   123.00 cm/s LVOT Vmean:  83.550 cm/s LVOT VTI:    0.318 m  AORTA Ao Root diam: 3.30 cm MITRAL VALVE                TRICUSPID VALVE MV Area (PHT): 2.08 cm     TR Peak grad:   33.2 mmHg MV Decel Time: 366 msec     TR Vmax:        288.00 cm/s MV E velocity: 85.60 cm/s MV A velocity: 103.60 cm/s  SHUNTS MV E/A ratio:  0.83         Systemic VTI:  0.32 m                             Systemic Diam: 2.00 cm Rozann Lesches MD Electronically signed by Rozann Lesches MD Signature Date/Time: 03/30/2020/11:01:53 AM    Final    US Abdomen Limited RUQ (LIVER/GB)  Result Date: 03/29/2020 CLINICAL DATA:  Elevated liver function tests. Status post prior cholecystectomy. EXAM: ULTRASOUND ABDOMEN LIMITED RIGHT UPPER QUADRANT COMPARISON:  11/28/2011 FINDINGS: Gallbladder: Surgically absent Common bile duct: Diameter: 4 mm. Liver: The liver is shrunken, nodular and grossly cirrhotic. Parenchyma is very heterogeneous. No gross lesion or intrahepatic biliary ductal dilatation. The portal vein is not very well visualized. Color Doppler flow is demonstrated in the main portal vein with limited evaluation of the intrahepatic portal veins. Other: Moderate ascites is present surrounding the liver in the right upper quadrant. IMPRESSION: 1. The liver demonstrates evidence of advanced cirrhosis by ultrasound. 2. Moderate ascites surrounding the liver in the right upper quadrant. Electronically Signed   By: Aletta Edouard M.D.   On: 03/29/2020 17:01     Microbiology: Recent Results (from the past 240 hour(s))  Resp Panel by RT-PCR (Flu A&B, Covid) Nasopharyngeal Swab     Status: None   Collection Time: 03/29/20 12:00 PM   Specimen: Nasopharyngeal Swab; Nasopharyngeal(NP) swabs in vial transport medium  Result Value Ref Range Status   SARS Coronavirus 2 by RT PCR NEGATIVE NEGATIVE Final    Comment: (NOTE) SARS-CoV-2 target nucleic acids are NOT DETECTED.  The SARS-CoV-2 RNA is generally detectable in upper respiratory specimens during the acute phase of infection. The lowest concentration of SARS-CoV-2 viral copies this assay can detect is 138 copies/mL. A negative result does not preclude SARS-Cov-2 infection and should not be used as the sole basis for treatment or other patient management decisions. A negative result may occur with  improper specimen collection/handling, submission of specimen other than nasopharyngeal swab, presence of viral mutation(s) within the areas targeted by this assay, and inadequate number of viral copies(<138 copies/mL). A negative result must be combined with clinical observations, patient history, and epidemiological information. The expected result is Negative.  Fact Sheet for Patients:  EntrepreneurPulse.com.au  Fact Sheet for Healthcare Providers:  IncredibleEmployment.be  This test is no t yet approved or cleared by the [place] FDA and  has been authorized for detection and/or diagnosis  of SARS-CoV-2 by FDA under an Emergency Use Authorization (EUA). This EUA will remain  in effect (meaning this test can be used) for the duration of the COVID-19 declaration under Section 564(b)(1) of the Act, 21 U.S.C.section 360bbb-3(b)(1), unless the authorization is terminated  or revoked sooner.       Influenza A by PCR NEGATIVE NEGATIVE Final   Influenza B by PCR NEGATIVE NEGATIVE Final    Comment: (NOTE) The Xpert Xpress SARS-CoV-2/FLU/RSV plus assay is  intended as an aid in the diagnosis of influenza from Nasopharyngeal swab specimens and should not be used as a sole basis for treatment. Nasal washings and aspirates are unacceptable for Xpert Xpress SARS-CoV-2/FLU/RSV testing.  Fact Sheet for Patients: EntrepreneurPulse.com.au  Fact Sheet for Healthcare Providers: IncredibleEmployment.be  This test is not yet approved or cleared by the [place] FDA and has been authorized for detection and/or diagnosis of SARS-CoV-2 by FDA under an Emergency Use Authorization (EUA). This EUA will remain in effect (meaning this test can be used) for the duration of the COVID-19 declaration under Section 564(b)(1) of the Act, 21 U.S.C. section 360bbb-3(b)(1), unless the authorization is terminated or revoked.  Performed at [facility], [address], [place], [place] [address]   SARS CORONAVIRUS 2 (TAT 6-24 HRS) Nasopharyngeal Nasopharyngeal Swab     Status: None   Collection Time: 03/31/20 12:22 PM   Specimen: Nasopharyngeal Swab  Result Value Ref Range Status   SARS Coronavirus 2 NEGATIVE NEGATIVE Final    Comment: (NOTE) SARS-CoV-2 target nucleic acids are NOT DETECTED.  The SARS-CoV-2 RNA is generally detectable in upper and lower respiratory specimens during the acute phase of infection. Negative results do not preclude SARS-CoV-2 infection, do not rule out co-infections with other pathogens, and should not be used as the sole basis for treatment or other patient management decisions. Negative results must be combined with clinical observations, patient history, and epidemiological information. The expected result is Negative.  Fact Sheet for Patients: SugarRoll.be  Fact Sheet for Healthcare Providers: https://www.woods-mathews.com/  This test is not yet approved or cleared by the [place] FDA and  has been authorized for detection and/or diagnosis  of SARS-CoV-2 by FDA under an Emergency Use Authorization (EUA). This EUA will remain  in effect (meaning this test can be used) for the duration of the COVID-19 declaration under Se ction 564(b)(1) of the Act, 21 U.S.C. section 360bbb-3(b)(1), unless the authorization is terminated or revoked sooner.  Performed at [facility], [place] [address] [place], [place] [address]      Labs: Basic Metabolic Panel: Recent Labs  Lab 03/29/20 1151 03/30/20 0612 03/31/20 0745 04/01/20 0617  NA 137 138 137 136  K 4.1 3.7 3.9 4.0  CL 106 104 101 102  CO2 24 25 27 28   GLUCOSE 92 82 85 81  BUN 19 19 18 20   CREATININE 1.00 0.95 1.00 1.09  CALCIUM 8.7* 8.2* 8.3* 8.3*   Liver Function Tests: Recent Labs  Lab 03/29/20 1151  AST 52*  ALT 21  ALKPHOS 118  BILITOT 3.2*  PROT 6.1*  ALBUMIN 2.3*   No results for input(s): LIPASE, AMYLASE in the last 168 hours. No results for input(s): AMMONIA in the last 168 hours. CBC: Recent Labs  Lab 03/29/20 1151  WBC 6.1  NEUTROABS 4.0  HGB 11.0*  HCT 33.0*  MCV 100.0  PLT 121*   Cardiac Enzymes: No results for input(s): CKTOTAL, CKMB, CKMBINDEX, TROPONINI in the last 168 hours. BNP: BNP (last  3 results) Recent Labs    03/29/20 1151  BNP 129.0*    ProBNP (last 3 results) No results for input(s): PROBNP in the last 8760 hours.  CBG: Recent Labs  Lab 03/29/20 2213  GLUCAP 99       Signed:  Annita Brod, MD [facility] 04/01/2020, 9:28 AM

## 2020-04-01 NOTE — Care Management Important Message (Signed)
Important Message  Patient Details  Name: Gabriel Spencer MRN: [id number] Date of Birth: 10-Dec-1931   Medicare Important Message Given:  Yes     Tommy Medal 04/01/2020, 12:55 PM

## 2020-04-04 ENCOUNTER — Encounter: Payer: Self-pay | Admitting: Adult Health

## 2020-04-04 ENCOUNTER — Non-Acute Institutional Stay (INDEPENDENT_AMBULATORY_CARE_PROVIDER_SITE_OTHER): Payer: Self-pay | Admitting: Adult Health

## 2020-04-04 DIAGNOSIS — D696 Thrombocytopenia, unspecified: Secondary | ICD-10-CM

## 2020-04-04 DIAGNOSIS — Z8551 Personal history of malignant neoplasm of bladder: Secondary | ICD-10-CM

## 2020-04-04 DIAGNOSIS — H8110 Benign paroxysmal vertigo, unspecified ear: Secondary | ICD-10-CM

## 2020-04-04 DIAGNOSIS — R188 Other ascites: Secondary | ICD-10-CM | POA: Insufficient documentation

## 2020-04-04 DIAGNOSIS — E663 Overweight: Secondary | ICD-10-CM

## 2020-04-04 DIAGNOSIS — H401130 Primary open-angle glaucoma, bilateral, stage unspecified: Secondary | ICD-10-CM

## 2020-04-04 DIAGNOSIS — K7460 Unspecified cirrhosis of liver: Secondary | ICD-10-CM | POA: Insufficient documentation

## 2020-04-04 DIAGNOSIS — I5033 Acute on chronic diastolic (congestive) heart failure: Secondary | ICD-10-CM

## 2020-04-04 DIAGNOSIS — E039 Hypothyroidism, unspecified: Secondary | ICD-10-CM

## 2020-04-04 DIAGNOSIS — J810 Acute pulmonary edema: Secondary | ICD-10-CM

## 2020-04-04 DIAGNOSIS — R531 Weakness: Secondary | ICD-10-CM

## 2020-04-04 DIAGNOSIS — M1A09X0 Idiopathic chronic gout, multiple sites, without tophus (tophi): Secondary | ICD-10-CM

## 2020-04-04 DIAGNOSIS — E43 Unspecified severe protein-calorie malnutrition: Secondary | ICD-10-CM

## 2020-04-04 NOTE — Progress Notes (Signed)
Location:  [place] Room Number: 153/P Place of Service:  SNF (31)   CODE STATUS: Full Code  Allergies  Allergen Reactions  . Cephalexin Hives and Other (See Comments)    Chief Complaint  Patient presents with  . Hospitalization Follow-up    Hospitalization Follow Up     HPI:  He is a 87 year old man who has been hospitalized from 03-29-20 through 04-01-20. He has a medical history which includes macular degeneration; glaucoma; hypothyroidism; DJD. He presented to the ED for 2 weeks of increased weakness; several days of increased shortness of breath; and cough. He had increased weakness with increased difficulty with ambulation.  He had been treated for an uti on an outpatient basis.  His chest x-ray demonstrated bilateral pulmonary edema consistent with CHF. There is no known previous history of chf. He was started on lasix. His 2-d echo done on 03-30-19 demonstrated EF of 70-75%. He responded well to diuresis. He was not started on ACE due to his soft blood pressures. He is here for short term rehab with his goal to return home with his wife. He denies any cough or shortness of breath; no nausea or vomiting. He does complain of weakness in bilateral lower extremities. 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 Acute lower UTI 01/10/2019  . Arthritis   . Glaucoma   . Macular degeneration   . Thyroid disease     Past Surgical History:  Procedure Laterality Date  . BLADDER SURGERY    . CATARACT EXTRACTION, BILATERAL    . CHOLECYSTECTOMY  11/30/2011   Procedure: LAPAROSCOPIC CHOLECYSTECTOMY;  Surgeon: Jamesetta So, MD;  Location: [facility];  Service: General;  Laterality: N/A;  . CYSTOSCOPY/RETROGRADE/URETEROSCOPY Bilateral 03/10/2015   Procedure: Consuela Mimes  BILATERAL Concha Se;  Surgeon: Irine Seal, MD;  Location: [facility];  Service: Urology;  Laterality: Bilateral;  . LYMPH NODE BIOPSY  1975   right  . RETINAL DETACHMENT SURGERY     right eye  . TRANSURETHRAL RESECTION OF BLADDER TUMOR Bilateral 03/10/2015   Procedure: TRANSURETHRAL RESECTION OF BLADDER TUMOR (TURBT) INSTILLATION WITH MITOMYCIN C IN RECOVERY;  Surgeon: Irine Seal, MD;  Location: [facility];  Service: Urology;  Laterality: Bilateral;  . TYMPANOPLASTY     right    Social History   Socioeconomic History  . Marital status: Married    Spouse name: Not on file  . Number of children: Not on file  . Years of education: Not on file  . Highest education level: Not on file  Occupational History  . Occupation: retired   Tobacco Use  . Smoking status: Former Smoker    Quit date: 03/27/1983    Years since quitting: 37.0  . Smokeless tobacco: Never Used  Vaping Use  . Vaping Use: Never used  Substance and Sexual Activity  . Alcohol use: No  . Drug use: No  . Sexual activity: Not Currently  Other Topics Concern  . Not on file  Social History Narrative  . Not on file   Social Determinants of Health   Financial Resource Strain: Not on file  Food Insecurity: Not on file  Transportation Needs: Not on file  Physical Activity: Not on file  Stress: Not on file  Social Connections: Not on file  Intimate Partner Violence:  Not on file   Family History  Problem Relation Age of Onset  . Hypertension Father       VITAL SIGNS BP 130/66   Pulse 79   Temp 98.5 F (36.9 C)   Resp 16   Ht 5\' 11"  (1.803 m)   Wt 200 lb 13.4 oz (91.1 kg)   SpO2 97%   BMI 28.01 kg/m   Outpatient Encounter Medications as of 04/04/2020  Medication Sig  . albuterol (PROVENTIL HFA;VENTOLIN HFA) 108 (90 Base) MCG/ACT inhaler Inhale 2 puffs into the lungs every 4 (four) hours as needed for wheezing or shortness of breath.  . allopurinol (ZYLOPRIM) 100 MG tablet Take 100 mg by mouth daily.  Roseanne Kaufman  [place]-Castor Oil (VENELEX) OINT Apply topically in the morning, at noon, and at bedtime. Special Instructions: Apply to sacrum, coccyx and bilateral buttocks qshift for prevention.  . brimonidine (ALPHAGAN) 0.15 % ophthalmic solution Place 1 drop into both eyes 2 (two) times daily.  . cholecalciferol (VITAMIN D3) 10 MCG (400 UNIT) TABS tablet Take 400 Units by mouth daily.  . furosemide (LASIX) 20 MG tablet Take 1 tablet (20 mg total) by mouth daily.  Marland Kitchen ketotifen (ZADITOR) 0.025 % ophthalmic solution Place 1 drop into both eyes 2 (two) times daily.  Marland Kitchen latanoprost (XALATAN) 0.005 % ophthalmic solution Place 1 drop into both eyes at bedtime.  Marland Kitchen levothyroxine (SYNTHROID) 50 MCG tablet Take 50 mcg by mouth daily.  . meclizine (ANTIVERT) 12.5 MG tablet Take 12.5 mg by mouth 2 (two) times daily.  . Multiple Vitamins-Minerals (PRESERVISION AREDS PO) Take 1 tablet by mouth 2 (two) times daily.  . NON FORMULARY Diet: _____ Regular, ___x___ NAS, _______Consistent Carbohydrate, _______NPO _____Other  . [DISCONTINUED] Ergocalciferol (VITAMIN D2) 400 units TABS Take 1 tablet by mouth daily.   No facility-administered encounter medications on file as of 04/04/2020.     SIGNIFICANT DIAGNOSTIC EXAMS  TODAY  03-29-20: chest x-ray: Cardiomegaly with diffuse bilateral pulmonary infiltrates/edema and bilateral pleural effusions. Findings most consistent CHF. Bilateral pneumonia cannot be excluded  03-29-20: abdominal ultrasound:  1. The liver demonstrates evidence of advanced cirrhosis by ultrasound. 2. Moderate ascites surrounding the liver in the right upper quadrant.  03-30-20: 2-d echo Left Ventricle: Left ventricular ejection fraction, by estimation, is 70  to 75%. The left ventricle has hyperdynamic function. The left ventricle  has no regional wall motion abnormalities. The left ventricular internal  cavity size was normal in size   LABS REVIEWED: TODAY;   03-30-19: wbc 6.1; hgb 11.0; hct 33.0 mcv 100.0;  plt 121; glucose 92; bun 19; creat 1.0; k+ 4.1; na++ 137; ca 8.7; GFR >60; ast 52 total bili 2.3 BNP 129 tsh 6.366 03-30-20: glucose 82; bun 19; creat 0.95; k+ 3.7; na++ 138; ca 8.2 GFR >60; free t4: 1.32 04-01-20: glucose 81; bun 20; creat 1.09; k+ 4.0; na++ 136; ca 8.3 GFR >60   Review of Systems  Constitutional: Negative for malaise/fatigue.  Respiratory: Negative for cough and shortness of breath.        No recent cough   Cardiovascular: Positive for leg swelling. Negative for chest pain and palpitations.  Gastrointestinal: Negative for abdominal pain, constipation and heartburn.  Musculoskeletal: Negative for back pain, joint pain and myalgias.  Skin: Negative.   Neurological: Positive for weakness. Negative for dizziness.  Psychiatric/Behavioral: The patient is not nervous/anxious.     Physical Exam Constitutional:      General: He is not in acute distress.    Appearance: He  is overweight and well-nourished. He is not diaphoretic.  Neck:     Thyroid: No thyromegaly.     Vascular: No JVD.  Cardiovascular:     Rate and Rhythm: Normal rate and regular rhythm.     Pulses: Intact distal pulses.     Heart sounds: Normal heart sounds.  Pulmonary:     Effort: Pulmonary effort is normal. No respiratory distress.     Breath sounds: Normal breath sounds.  Abdominal:     General: Bowel sounds are normal. There is no distension.     Palpations: Abdomen is soft.     Tenderness: There is no abdominal tenderness.  Musculoskeletal:     Cervical back: Neck supple.     Right lower leg: Edema present.     Left lower leg: Edema present.     Comments: Is able to move all extremities Has 4++ bilateral lower extremity edema  Lymphadenopathy:     Cervical: No cervical adenopathy.  Skin:    General: Skin is warm and dry.  Neurological:     Mental Status: He is alert and oriented to person, place, and time.  Psychiatric:        Mood and Affect: Mood and affect and mood normal.        ASSESSMENT/ PLAN:  TODAY  1. Acute on chronic diastolic CHF (congestive heart failure)/ acute pulmonary edema is stable EF 70-75% (03-29-20): will continue lasix 20 mg daily; will use ace wraps daily for lower extremity edema and will monitor his status.   2. Hypothyroidism unspecified type: is stable tsh 6.366 will continue synthroid 50 mcg daily   3. Overweight (BMI 25.0-29.9) BMI is 28.01 will monitor   4. History of bladder cancer: 2016 :is status post transurethral resection of bladder 03-10-15 will monitor   5. Primary open angle glaucoma of both eyes  unspecified glaucoma stage is stable will continue alphagan to both eyes twice daily; zaditor to both eyes twice daily; xalatan to both eyes.   6. Generalized weakness: is without change will continue therapy as directed to improve upon his level of independence with his adls.   7. 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 Cirrhosis of liver with ascites unspecified hepatic cirrhosis type: (per ultrasound on 03-29-20)  LFT's elevated will monitor   9. Benign vertigo: is stable had syncopal episode in Oct 2020; will change antivert to twice daily as needed through 04-11-20  10. Thrombocytopenia: is stable plt 121 will monitor   11. Protein calorie malnutrition, severe: is without change albumin 2.3 will begin prostat 30 cc three times daily  Will check cbc; cmp.    MD is aware of resident's narcotic use and is in agreement with current plan of care. We will attempt to wean resident as appropriate.  Ok Edwards NP [facility]  Contact [phone] Monday through Friday 8am- 5pm  After hours call [phone]

## 2020-04-05 ENCOUNTER — Non-Acute Institutional Stay (INDEPENDENT_AMBULATORY_CARE_PROVIDER_SITE_OTHER): Payer: Self-pay | Admitting: Adult Health

## 2020-04-05 ENCOUNTER — Encounter: Payer: Self-pay | Admitting: Adult Health

## 2020-04-05 ENCOUNTER — Other Ambulatory Visit (HOSPITAL_COMMUNITY)
Admission: RE | Admit: 2020-04-05 | Discharge: 2020-04-05 | Disposition: A | Discharge status: Home / Self Care | Payer: Medicare PPO | Source: Skilled Nursing Facility | Attending: Adult Health | Admitting: Adult Health

## 2020-04-05 DIAGNOSIS — I5032 Chronic diastolic (congestive) heart failure: Secondary | ICD-10-CM

## 2020-04-05 DIAGNOSIS — D649 Anemia, unspecified: Secondary | ICD-10-CM

## 2020-04-05 DIAGNOSIS — K7460 Unspecified cirrhosis of liver: Secondary | ICD-10-CM

## 2020-04-05 DIAGNOSIS — I5033 Acute on chronic diastolic (congestive) heart failure: Secondary | ICD-10-CM | POA: Insufficient documentation

## 2020-04-05 DIAGNOSIS — R188 Other ascites: Secondary | ICD-10-CM

## 2020-04-05 LAB — COMPREHENSIVE METABOLIC PANEL
ALT: 19 U/L (ref 0–44)
AST: 44 U/L — ABNORMAL HIGH (ref 15–41)
Albumin: 2 g/dL — ABNORMAL LOW (ref 3.5–5.0)
Alkaline Phosphatase: 122 U/L (ref 38–126)
Anion gap: 6 (ref 5–15)
BUN: 31 mg/dL — ABNORMAL HIGH (ref 8–23)
CO2: 27 mmol/L (ref 22–32)
Calcium: 8.2 mg/dL — ABNORMAL LOW (ref 8.9–10.3)
Chloride: 103 mmol/L (ref 98–111)
Creatinine, Ser: 0.91 mg/dL (ref 0.61–1.24)
GFR, Estimated: >60 mL/min (ref >60)
Glucose, Bld: 77 mg/dL (ref 70–99)
Potassium: 4 mmol/L (ref 3.5–5.1)
Sodium: 136 mmol/L (ref 135–145)
Total Bilirubin: 2.2 mg/dL — ABNORMAL HIGH (ref 0.3–1.2)
Total Protein: 5.3 g/dL — ABNORMAL LOW (ref 6.5–8.1)

## 2020-04-05 LAB — CBC
HCT: 28.5 % — ABNORMAL LOW (ref 39.0–52.0)
Hemoglobin: 9.7 g/dL — ABNORMAL LOW (ref 13.0–17.0)
MCH: 33.2 pg (ref 26.0–34.0)
MCHC: 34 g/dL (ref 30.0–36.0)
MCV: 97.6 fL (ref 80.0–100.0)
Platelets: 98 10*3/uL — ABNORMAL LOW (ref 150–400)
RBC: 2.92 MIL/uL — ABNORMAL LOW (ref 4.22–5.81)
RDW: 15.6 % — ABNORMAL HIGH (ref 11.5–15.5)
WBC: 5.3 10*3/uL (ref 4.0–10.5)
nRBC: 0 % (ref 0.0–0.2)

## 2020-04-05 LAB — AMMONIA: Ammonia: 50 umol/L — ABNORMAL HIGH (ref 9–35)

## 2020-04-05 NOTE — Progress Notes (Signed)
Location:  [place] Room Number: 153-P Place of Service:  SNF (31)   CODE STATUS: Full Code   Allergies  Allergen Reactions  . Cephalexin Hives and Other (See Comments)    Chief Complaint  Patient presents with  . Acute Visit    Follow-up on labs     HPI:    Past Medical History:  Diagnosis Date  . Acute lower UTI 01/10/2019  . Arthritis   . Glaucoma   . Macular degeneration   . Thyroid disease     Past Surgical History:  Procedure Laterality Date  . BLADDER SURGERY    . CATARACT EXTRACTION, BILATERAL    . CHOLECYSTECTOMY  11/30/2011   Procedure: LAPAROSCOPIC CHOLECYSTECTOMY;  Surgeon: Jamesetta So, MD;  Location: [facility];  Service: General;  Laterality: N/A;  . CYSTOSCOPY/RETROGRADE/URETEROSCOPY Bilateral 03/10/2015   Procedure: Consuela Mimes BILATERAL Concha Se;  Surgeon: Irine Seal, MD;  Location: [facility];  Service: Urology;  Laterality: Bilateral;  . LYMPH NODE BIOPSY  1975   right  . RETINAL DETACHMENT SURGERY     right eye  . TRANSURETHRAL RESECTION OF BLADDER TUMOR Bilateral 03/10/2015   Procedure: TRANSURETHRAL RESECTION OF BLADDER TUMOR (TURBT) INSTILLATION WITH MITOMYCIN C IN RECOVERY;  Surgeon: Irine Seal, MD;  Location: [facility];  Service: Urology;  Laterality: Bilateral;  . TYMPANOPLASTY     right    Social History   Socioeconomic History  . Marital status: Married    Spouse name: Not on file  . Number of children: Not on file  . Years of education: Not on file  . Highest education level: Not on file  Occupational History  . Occupation: retired   Tobacco Use  . Smoking status: Former Smoker    Quit date: 03/27/1983    Years since quitting: 37.0  . Smokeless tobacco: Never Used  Vaping Use  . Vaping Use: Never used  Substance and Sexual Activity  . Alcohol use: No  . Drug use: No  . Sexual activity: Not Currently  Other Topics Concern  . Not on file  Social History Narrative  . Not on file   Social Determinants  of Health   Financial Resource Strain: Not on file  Food Insecurity: Not on file  Transportation Needs: Not on file  Physical Activity: Not on file  Stress: Not on file  Social Connections: Not on file  Intimate Partner Violence: Not on file   Family History  Problem Relation Age of Onset  . Hypertension Father       VITAL SIGNS BP 140/64   Pulse 70   Temp (!) 97.4 F (36.3 C)   Resp 20   SpO2 96%   Outpatient Encounter Medications as of 04/05/2020  Medication Sig  . albuterol (PROVENTIL HFA;VENTOLIN HFA) 108 (90 Base) MCG/ACT inhaler Inhale 2 puffs into the lungs every 4 (four) hours as needed for wheezing or shortness of breath.  . allopurinol (ZYLOPRIM) 100 MG tablet Take 100 mg by mouth daily.  . Amino Acids-Protein Hydrolys (FEEDING SUPPLEMENT, PRO-STAT SUGAR FREE 64,) LIQD Take 30 mLs by mouth 3 (three) times daily with meals. for albumin 2.3  . Balsam [place]-Castor Oil (VENELEX) OINT Apply topically in the morning, at noon, and at bedtime. Special Instructions: Apply to sacrum, coccyx and bilateral buttocks qshift for prevention.  . brimonidine (ALPHAGAN) 0.15 % ophthalmic solution Place 1 drop into both eyes 2 (two) times daily.  . cholecalciferol (VITAMIN D3) 10 MCG (400 UNIT) TABS tablet Take 400  Units by mouth daily.  Marland Kitchen ketotifen (ZADITOR) 0.025 % ophthalmic solution Place 1 drop into both eyes 2 (two) times daily.  Marland Kitchen lactulose (CHRONULAC) 10 GM/15ML solution Take 10 g by mouth daily. for ammonia level of 50  . latanoprost (XALATAN) 0.005 % ophthalmic solution Place 1 drop into both eyes at bedtime.  Marland Kitchen levothyroxine (SYNTHROID) 50 MCG tablet Take 50 mcg by mouth daily.  . meclizine (ANTIVERT) 12.5 MG tablet Take 12.5 mg by mouth 2 (two) times daily.  . Multiple Vitamins-Minerals (PRESERVISION AREDS PO) Take 1 tablet by mouth 2 (two) times daily.  . NON FORMULARY Diet: _____ Regular, ___x___ NAS, _______Consistent Carbohydrate, _______NPO _____Other  .  [DISCONTINUED] furosemide (LASIX) 20 MG tablet Take 1 tablet (20 mg total) by mouth daily.   No facility-administered encounter medications on file as of 04/05/2020.     SIGNIFICANT DIAGNOSTIC EXAMS  PREVIOUS   03-29-20: chest x-ray: Cardiomegaly with diffuse bilateral pulmonary infiltrates/edema and bilateral pleural effusions. Findings most consistent CHF. Bilateral pneumonia cannot be excluded  03-29-20: abdominal ultrasound:  1. The liver demonstrates evidence of advanced cirrhosis by ultrasound. 2. Moderate ascites surrounding the liver in the right upper quadrant.  03-30-20: 2-d echo Left Ventricle: Left ventricular ejection fraction, by estimation, is 70  to 75%. The left ventricle has hyperdynamic function. The left ventricle  has no regional wall motion abnormalities. The left ventricular internal  cavity size was normal in size   NO NEW EXAMS.   LABS REVIEWED: PREVIOUS   03-30-19: wbc 6.1; hgb 11.0; hct 33.0 mcv 100.0; plt 121; glucose 92; bun 19; creat 1.0; k+ 4.1; na++ 137; ca 8.7; GFR >60; ast 52 total bili 2.3 BNP 129 tsh 6.366 03-30-20: glucose 82; bun 19; creat 0.95; k+ 3.7; na++ 138; ca 8.2 GFR >60; free t4: 1.32 04-01-20: glucose 81; bun 20; creat 1.09; k+ 4.0; na++ 136; ca 8.3 GFR >60  TODAY  04-05-20: wbc 5.3; hgb 9.7; hct 27.5; mcv 97.6 plt 98; glucose 77; bun 31; creat 0.91; k+ 4.0; na++ 136; ca 8.2 ast 44; total bili 2.2 albumin 2.0 GFR >60  Review of Systems  Constitutional: Positive for malaise/fatigue.  Respiratory: Negative for cough and shortness of breath.   Cardiovascular: Positive for leg swelling. Negative for chest pain and palpitations.  Gastrointestinal: Negative for abdominal pain, constipation and heartburn.  Musculoskeletal: Negative for back pain, joint pain and myalgias.  Skin: Negative.   Neurological: Negative for dizziness.  Psychiatric/Behavioral: The patient is not nervous/anxious.    Physical Exam Constitutional:      General: He is not in  acute distress.    Appearance: He is well-developed and well-nourished. He is not diaphoretic.  Neck:     Thyroid: No thyromegaly.  Cardiovascular:     Rate and Rhythm: Normal rate and regular rhythm.     Pulses: Normal pulses and intact distal pulses.     Heart sounds: Normal heart sounds.  Pulmonary:     Effort: Pulmonary effort is normal. No respiratory distress.     Breath sounds: Normal breath sounds.  Abdominal:     General: Bowel sounds are normal. There is distension.     Palpations: Abdomen is soft.     Tenderness: There is no abdominal tenderness.  Musculoskeletal:     Cervical back: Neck supple.     Right lower leg: Edema present.     Left lower leg: Edema present.     Comments: Is able to move all extremities Has 4++ bilateral lower extremity  edema   Lymphadenopathy:     Cervical: No cervical adenopathy.  Skin:    General: Skin is warm and dry.  Neurological:     Mental Status: He is alert and oriented to person, place, and time.  Psychiatric:        Mood and Affect: Mood and affect and mood normal.     ASSESSMENT/ PLAN:  TODAY  1. Acute on chronic diastolic CHF (congestive heart failure) 2. Cirrhosis of liver with ascites unspecified hepatic cirrhosis type 3. Normochromic normocytic anemia   Is worse  Will increase lasix to 40 mg daily  Will begin lactulose 30 mL daily Will guaiac stool X 3 Will monitor his status.     MD is aware of resident's narcotic use and is in agreement with current plan of care. We will attempt to wean resident as appropriate.  Ok Edwards NP [facility]  Contact [phone] Monday through Friday 8am- 5pm  After hours call [phone]

## 2020-04-07 ENCOUNTER — Non-Acute Institutional Stay (INDEPENDENT_AMBULATORY_CARE_PROVIDER_SITE_OTHER): Payer: Self-pay | Admitting: Internal Medicine

## 2020-04-07 ENCOUNTER — Encounter: Payer: Self-pay | Admitting: Internal Medicine

## 2020-04-07 DIAGNOSIS — D649 Anemia, unspecified: Secondary | ICD-10-CM

## 2020-04-07 DIAGNOSIS — R188 Other ascites: Secondary | ICD-10-CM

## 2020-04-07 DIAGNOSIS — K7460 Unspecified cirrhosis of liver: Secondary | ICD-10-CM

## 2020-04-07 DIAGNOSIS — G9340 Encephalopathy, unspecified: Secondary | ICD-10-CM

## 2020-04-07 DIAGNOSIS — E43 Unspecified severe protein-calorie malnutrition: Secondary | ICD-10-CM

## 2020-04-07 DIAGNOSIS — I5033 Acute on chronic diastolic (congestive) heart failure: Secondary | ICD-10-CM

## 2020-04-07 NOTE — Assessment & Plan Note (Signed)
Albumin 3.0 and total protein 5.3 predischarge 04/01/2020. Nutrition consult at SNF.

## 2020-04-07 NOTE — Assessment & Plan Note (Signed)
No bleeding dyscrasias reported.  Monitor anemia.

## 2020-04-07 NOTE — Assessment & Plan Note (Addendum)
1/4 - 04/01/2020 hospitalized with acute on chronic congestive heart failure.  AST was 52. Lactulose initiated here at [facility] for encephalopathy with ammonia of 50.

## 2020-04-07 NOTE — Assessment & Plan Note (Signed)
Clinically compensated at this time except for profound peripheral edema. Lasix dose was increased to 40 mg daily by the NP.

## 2020-04-07 NOTE — Progress Notes (Signed)
NURSING HOME LOCATION: [place] ROOM NUMBER: 153/P   CODE STATUS: Full Code   [id number], Nimish C, MD    This is a comprehensive admission note to [facility] performed on this date less than 30 days from date of admission. Included are preadmission medical/surgical history; reconciled medication list; family history; social history and comprehensive review of systems.  Corrections and additions to the records were documented. Comprehensive physical exam was also performed. Additionally a clinical summary was entered for each active diagnosis pertinent to this admission in the Problem List to enhance continuity of care.  HPI: Patient was hospitalized 1/4 - 04/01/2020 with acute on chronic diastolic congestive heart failure, presenting with shortness of breath and nonproductive cough present several days PTA.  Imaging suggested bilateral pulmonary edema consistent with CHF.  IV Lasix was initiated in the ED.  He diuresed over 2-1/2 L with a weight loss of 6 pounds.   Echocardiogram 1/5 revealed preserved ejection fraction; definitive diastolic heart failure could not be diagnosed. Blood pressure was slightly soft following the diuresis; therefore ACE inhibitor was not initiated.  Lasix was decreased to 20 mg daily as maintenance. Stage II pressure ulcer of the right buttock was present at admission. Because of generalized weakness, deconditioning,& advanced age he was discharged to the SNF for PT/OT.  Past medical and surgical history: Includes hypothyroidism, macular degeneration, glaucoma, & history of bladder cancer. Surgeries include bladder surgery, cholecystectomy, and TURP.  Social history: He is a former smoker having quit in 1985.  Nondrinker.  Family history: Limited history reviewed; it is noncontributory due to his advanced age.   Review of systems: He appears somewhat lethargic but is oriented.  He denies any active cardiopulmonary symptoms except for  edema.  He does describe watering of the right eye in the context of chronic eye conditions, glaucoma and macular degeneration.  He denies other allergic symptoms He has no GU symptoms at this time but stated that he was treated for prolonged UTI previously.  He did have some increased gas last night. Because of encephalopathic clinical findings after arrival lactulose was initiated here at the SNF.  Ammonia level was 50.  Constitutional: No fever, significant weight change  Eyes: No new pain, vision change ENT/mouth: No nasal congestion, purulent discharge, earache, change in hearing, sore throat  Cardiovascular: No chest pain, palpitations, paroxysmal nocturnal dyspnea  Respiratory: No cough, sputum production, hemoptysis, significant snoring, apnea  Gastrointestinal: No heartburn, dysphagia, abdominal pain, nausea /vomiting, rectal bleeding, melena, change in bowels Genitourinary: No dysuria, hematuria, pyuria, incontinence, nocturia now Musculoskeletal: No joint stiffness, joint swelling, weakness, pain Dermatologic: No rash, pruritus, change in appearance of skin Neurologic: No dizziness, headache, syncope, seizures, numbness, tingling Psychiatric: No significant anxiety, depression, insomnia, anorexia Endocrine: No change in hair/skin/nails, excessive thirst, excessive hunger, excessive urination  Hematologic/lymphatic: No significant bruising, lymphadenopathy, abnormal bleeding Allergy/immunology: No  significant sneezing, urticaria, angioedema  Physical exam:  Pertinent or positive findings: As noted he appears chronically ill; hair is disheveled.  His voice is raspy and harsh.  Exotropia is present on the right.  Ptosis is greater of the right eye than the left.  Dental malalignment is present.  Heart sounds are distant.  He has asymmetric rales; the rales are greater on the right than the left anteriorly but this is reversed posteriorly but to a more subtle degree.  Edema of the lower  extremities is visible even though both lower extremities are wrapped.  Pedal pulses are not palpable  due to the wrapping. He is weak to opposition in all extremities.Two venous nevi present on lower lip.  General appearance: in no acute distress, increased work of breathing is present.   Lymphatic: No lymphadenopathy about the head, neck, axilla. Eyes: No conjunctival inflammation or lid edema is present. There is no scleral icterus. Ears:  External ear exam shows no significant lesions or deformities.   Nose:  External nasal examination shows no deformity or inflammation. Nasal mucosa are pink and moist without lesions, exudates Oral exam: Lips and gums are healthy appearing.There is no oropharyngeal erythema or exudate. Neck:  No thyromegaly, masses, tenderness noted.    Heart:  No gallop, murmur, click, rub.  Lungs:  without wheezes, rhonchi, rubs. Abdomen: Bowel sounds are normal.  Abdomen is soft and nontender with no organomegaly, hernias, masses. GU: Deferred  Extremities:  No cyanosis, clubbing. Neurologic exam: Balance, Rhomberg, finger to nose testing could not be completed due to clinical state Skin: Warm & dry w/o tenting. No significant  rash.  See clinical summary under each active problem in the Problem List with associated updated therapeutic plan

## 2020-04-07 NOTE — Assessment & Plan Note (Signed)
Today he is oriented on exam.  Lactulose will be titrated as clinically indicated.

## 2020-04-07 NOTE — Patient Instructions (Signed)
See assessment and plan under each diagnosis in the problem list and acutely for this visit 

## 2020-04-08 ENCOUNTER — Non-Acute Institutional Stay (INDEPENDENT_AMBULATORY_CARE_PROVIDER_SITE_OTHER): Payer: Self-pay | Admitting: Adult Health

## 2020-04-08 ENCOUNTER — Encounter: Payer: Self-pay | Admitting: Adult Health

## 2020-04-08 DIAGNOSIS — I5033 Acute on chronic diastolic (congestive) heart failure: Secondary | ICD-10-CM

## 2020-04-08 NOTE — Progress Notes (Signed)
Location:  [facility] Room Number: 153-P Place of Service:  SNF (31)   CODE STATUS: Full Code   Allergies  Allergen Reactions  . Cephalexin Hives and Other (See Comments)    Chief Complaint  Patient presents with  . Acute Visit    Weight Gain     HPI:  Staff report today that he has increased bilateral lower extremity edema and his abdomen is slightly distended. He has had a 3 pound weight gain. There are no reports of fevers. He denies any cough or shortness of breath present. He denies any chest pain.   Past Medical History:  Diagnosis Date  . Acute lower UTI 01/10/2019  . Arthritis   . Glaucoma   . Macular degeneration   . Thyroid disease     Past Surgical History:  Procedure Laterality Date  . BLADDER SURGERY    . CATARACT EXTRACTION, BILATERAL    . CHOLECYSTECTOMY  11/30/2011   Procedure: LAPAROSCOPIC CHOLECYSTECTOMY;  Surgeon: Dalia Heading, MD;  Location: [facility];  Service: General;  Laterality: N/A;  . CYSTOSCOPY/RETROGRADE/URETEROSCOPY Bilateral 03/10/2015   Procedure: Bluford Kaufmann BILATERAL Iverson Alamin;  Surgeon: Bjorn Pippin, MD;  Location: [facility];  Service: Urology;  Laterality: Bilateral;  . LYMPH NODE BIOPSY  1975   right  . RETINAL DETACHMENT SURGERY     right eye  . TRANSURETHRAL RESECTION OF BLADDER TUMOR Bilateral 03/10/2015   Procedure: TRANSURETHRAL RESECTION OF BLADDER TUMOR (TURBT) INSTILLATION WITH MITOMYCIN C IN RECOVERY;  Surgeon: Bjorn Pippin, MD;  Location: [facility];  Service: Urology;  Laterality: Bilateral;  . TYMPANOPLASTY     right    Social History   Socioeconomic History  . Marital status: Married    Spouse name: Not on file  . Number of children: Not on file  . Years of education: Not on file  . Highest education level: Not on file  Occupational History  . Occupation: retired   Tobacco Use  . Smoking status: Former Smoker    Quit date: 03/27/1983    Years since quitting: 37.0  . Smokeless tobacco: Never Used   Vaping Use  . Vaping Use: Never used  Substance and Sexual Activity  . Alcohol use: No  . Drug use: No  . Sexual activity: Not Currently  Other Topics Concern  . Not on file  Social History Narrative  . Not on file   Social Determinants of Health   Financial Resource Strain: Not on file  Food Insecurity: Not on file  Transportation Needs: Not on file  Physical Activity: Not on file  Stress: Not on file  Social Connections: Not on file  Intimate Partner Violence: Not on file   Family History  Problem Relation Age of Onset  . Hypertension Father       VITAL SIGNS BP 136/82   Pulse 79   Temp 97.9 F (36.6 C)   Resp 16   SpO2 96%   Outpatient Encounter Medications as of 04/08/2020  Medication Sig  . albuterol (PROVENTIL HFA;VENTOLIN HFA) 108 (90 Base) MCG/ACT inhaler Inhale 2 puffs into the lungs every 4 (four) hours as needed for wheezing or shortness of breath.  . allopurinol (ZYLOPRIM) 100 MG tablet Take 100 mg by mouth daily.  . Amino Acids-Protein Hydrolys (FEEDING SUPPLEMENT, PRO-STAT SUGAR FREE 64,) LIQD Take 30 mLs by mouth 3 (three) times daily with meals. for albumin 2.3  . Balsam [place]-Castor Oil (VENELEX) OINT Apply topically in the morning, at noon, and at bedtime.  Special Instructions: Apply to sacrum, coccyx and bilateral buttocks qshift for prevention.  . brimonidine (ALPHAGAN) 0.15 % ophthalmic solution Place 1 drop into both eyes 2 (two) times daily.  . cholecalciferol (VITAMIN D3) 10 MCG (400 UNIT) TABS tablet Take 400 Units by mouth daily.  Marland Kitchen ketotifen (ZADITOR) 0.025 % ophthalmic solution Place 1 drop into both eyes 2 (two) times daily.  Marland Kitchen lactulose (CHRONULAC) 10 GM/15ML solution Take 10 g by mouth daily. for ammonia level of 50  . latanoprost (XALATAN) 0.005 % ophthalmic solution Place 1 drop into both eyes at bedtime.  Marland Kitchen levothyroxine (SYNTHROID) 50 MCG tablet Take 50 mcg by mouth daily.  . meclizine (ANTIVERT) 12.5 MG tablet Take 12.5 mg by mouth 2  (two) times daily as needed.  . Multiple Vitamins-Minerals (PRESERVISION AREDS PO) Take 1 tablet by mouth 2 (two) times daily.  . NON FORMULARY Diet: _____ Regular, ___x___ NAS, _______Consistent Carbohydrate, _______NPO _____Other  . [DISCONTINUED] furosemide (LASIX) 40 MG tablet Take 40 mg by mouth daily.   No facility-administered encounter medications on file as of 04/08/2020.     SIGNIFICANT DIAGNOSTIC EXAMS   PREVIOUS   03-29-20: chest x-ray: Cardiomegaly with diffuse bilateral pulmonary infiltrates/edema and bilateral pleural effusions. Findings most consistent CHF. Bilateral pneumonia cannot be excluded  03-29-20: abdominal ultrasound:  1. The liver demonstrates evidence of advanced cirrhosis by ultrasound. 2. Moderate ascites surrounding the liver in the right upper quadrant.  03-30-20: 2-d echo Left Ventricle: Left ventricular ejection fraction, by estimation, is 70  to 75%. The left ventricle has hyperdynamic function. The left ventricle  has no regional wall motion abnormalities. The left ventricular internal  cavity size was normal in size   NO NEW EXAMS.   LABS REVIEWED: PREVIOUS   03-30-19: wbc 6.1; hgb 11.0; hct 33.0 mcv 100.0; plt 121; glucose 92; bun 19; creat 1.0; k+ 4.1; na++ 137; ca 8.7; GFR >60; ast 52 total bili 2.3 BNP 129 tsh 6.366 03-30-20: glucose 82; bun 19; creat 0.95; k+ 3.7; na++ 138; ca 8.2 GFR >60; free t4: 1.32 04-01-20: glucose 81; bun 20; creat 1.09; k+ 4.0; na++ 136; ca 8.3 GFR >60 04-05-20: wbc 5.3; hgb 9.7; hct 27.5; mcv 97.6 plt 98; glucose 77; bun 31; creat 0.91; k+ 4.0; na++ 136; ca 8.2 ast 44; total bili 2.2 albumin 2.0 GFR >60 ammonia 50   NO NEW LABS.   Review of Systems  Constitutional: Positive for malaise/fatigue.  Respiratory: Negative for cough and shortness of breath.   Cardiovascular: Positive for leg swelling. Negative for chest pain and palpitations.  Gastrointestinal: Negative for abdominal pain, constipation and heartburn.   Musculoskeletal: Negative for back pain, joint pain and myalgias.  Skin: Negative.   Neurological: Negative for dizziness.  Psychiatric/Behavioral: The patient is not nervous/anxious.     Physical Exam Constitutional:      General: He is not in acute distress.    Appearance: He is well-developed and well-nourished. He is not diaphoretic.  Neck:     Thyroid: No thyromegaly.  Cardiovascular:     Rate and Rhythm: Normal rate and regular rhythm.     Pulses: Normal pulses and intact distal pulses.     Heart sounds: Normal heart sounds.  Pulmonary:     Effort: Pulmonary effort is normal. No respiratory distress.     Breath sounds: Normal breath sounds.  Abdominal:     General: Bowel sounds are normal. There is distension.     Palpations: Abdomen is soft.     Tenderness:  There is no abdominal tenderness.  Musculoskeletal:     Cervical back: Neck supple.     Right lower leg: Edema present.     Left lower leg: Edema present.     Comments: Is able to move all extremities Has severe  bilateral lower extremity edema    Lymphadenopathy:     Cervical: No cervical adenopathy.  Skin:    General: Skin is warm and dry.  Neurological:     Mental Status: He is alert and oriented to person, place, and time.  Psychiatric:        Mood and Affect: Mood and affect and mood normal.       ASSESSMENT/ PLAN:  1. Acute on chronic diastolic CHF (congestive heart failure)  Is worse Will stop lasix Will begin torsemide 40 mg daily  Will check BMP 04-14-20 Will monitor his status.   MD is aware of resident's narcotic use and is in agreement with current plan of care. We will attempt to wean resident as appropriate.  Ok Edwards NP [facility]  Contact [phone] Monday through Friday 8am- 5pm  After hours call [phone]

## 2020-04-09 ENCOUNTER — Encounter (HOSPITAL_COMMUNITY)
Admission: RE | Admit: 2020-04-09 | Discharge: 2020-04-09 | Disposition: A | Discharge status: Home / Self Care | Payer: Medicare PPO | Source: Skilled Nursing Facility | Attending: Adult Health | Admitting: Adult Health

## 2020-04-09 DIAGNOSIS — J690 Pneumonitis due to inhalation of food and vomit: Secondary | ICD-10-CM | POA: Diagnosis not present

## 2020-04-09 DIAGNOSIS — R0902 Hypoxemia: Secondary | ICD-10-CM | POA: Diagnosis not present

## 2020-04-09 LAB — OCCULT BLOOD X 1 CARD TO LAB, STOOL: Fecal Occult Bld: NEGATIVE

## 2020-04-10 ENCOUNTER — Encounter (HOSPITAL_COMMUNITY): Payer: Self-pay | Admitting: *Deleted

## 2020-04-10 ENCOUNTER — Inpatient Hospital Stay (HOSPITAL_COMMUNITY)
Admission: EM | Admit: 2020-04-10 | Discharge: 2020-04-13 | DRG: 177 | Disposition: A | Discharge status: Skilled Nursing Facility | Payer: Medicare PPO | Source: Skilled Nursing Facility | Attending: Family Medicine | Admitting: Family Medicine

## 2020-04-10 ENCOUNTER — Other Ambulatory Visit: Payer: Self-pay

## 2020-04-10 ENCOUNTER — Emergency Department (HOSPITAL_COMMUNITY): Payer: Medicare PPO

## 2020-04-10 DIAGNOSIS — G9341 Metabolic encephalopathy: Secondary | ICD-10-CM | POA: Diagnosis present

## 2020-04-10 DIAGNOSIS — H548 Legal blindness, as defined in USA: Secondary | ICD-10-CM | POA: Diagnosis present

## 2020-04-10 DIAGNOSIS — Z20822 Contact with and (suspected) exposure to covid-19: Secondary | ICD-10-CM | POA: Diagnosis present

## 2020-04-10 DIAGNOSIS — Z6828 Body mass index (BMI) 28.0-28.9, adult: Secondary | ICD-10-CM

## 2020-04-10 DIAGNOSIS — H3530 Unspecified macular degeneration: Secondary | ICD-10-CM | POA: Diagnosis present

## 2020-04-10 DIAGNOSIS — M1990 Unspecified osteoarthritis, unspecified site: Secondary | ICD-10-CM | POA: Diagnosis present

## 2020-04-10 DIAGNOSIS — J9601 Acute respiratory failure with hypoxia: Secondary | ICD-10-CM | POA: Diagnosis present

## 2020-04-10 DIAGNOSIS — Z79890 Hormone replacement therapy (postmenopausal): Secondary | ICD-10-CM | POA: Diagnosis not present

## 2020-04-10 DIAGNOSIS — H9190 Unspecified hearing loss, unspecified ear: Secondary | ICD-10-CM | POA: Diagnosis present

## 2020-04-10 DIAGNOSIS — E43 Unspecified severe protein-calorie malnutrition: Secondary | ICD-10-CM | POA: Diagnosis present

## 2020-04-10 DIAGNOSIS — Z8249 Family history of ischemic heart disease and other diseases of the circulatory system: Secondary | ICD-10-CM

## 2020-04-10 DIAGNOSIS — K7460 Unspecified cirrhosis of liver: Secondary | ICD-10-CM | POA: Diagnosis present

## 2020-04-10 DIAGNOSIS — M109 Gout, unspecified: Secondary | ICD-10-CM | POA: Diagnosis present

## 2020-04-10 DIAGNOSIS — Z87440 Personal history of urinary (tract) infections: Secondary | ICD-10-CM | POA: Diagnosis not present

## 2020-04-10 DIAGNOSIS — R319 Hematuria, unspecified: Secondary | ICD-10-CM | POA: Diagnosis not present

## 2020-04-10 DIAGNOSIS — Z87891 Personal history of nicotine dependence: Secondary | ICD-10-CM

## 2020-04-10 DIAGNOSIS — Z881 Allergy status to other antibiotic agents status: Secondary | ICD-10-CM | POA: Diagnosis not present

## 2020-04-10 DIAGNOSIS — T17908A Unspecified foreign body in respiratory tract, part unspecified causing other injury, initial encounter: Secondary | ICD-10-CM

## 2020-04-10 DIAGNOSIS — G9340 Encephalopathy, unspecified: Secondary | ICD-10-CM | POA: Diagnosis present

## 2020-04-10 DIAGNOSIS — Z79899 Other long term (current) drug therapy: Secondary | ICD-10-CM

## 2020-04-10 DIAGNOSIS — I5032 Chronic diastolic (congestive) heart failure: Secondary | ICD-10-CM | POA: Diagnosis present

## 2020-04-10 DIAGNOSIS — D649 Anemia, unspecified: Secondary | ICD-10-CM | POA: Diagnosis present

## 2020-04-10 DIAGNOSIS — R0902 Hypoxemia: Secondary | ICD-10-CM | POA: Diagnosis present

## 2020-04-10 DIAGNOSIS — J690 Pneumonitis due to inhalation of food and vomit: Secondary | ICD-10-CM | POA: Diagnosis present

## 2020-04-10 DIAGNOSIS — H409 Unspecified glaucoma: Secondary | ICD-10-CM | POA: Diagnosis present

## 2020-04-10 DIAGNOSIS — E039 Hypothyroidism, unspecified: Secondary | ICD-10-CM | POA: Diagnosis present

## 2020-04-10 DIAGNOSIS — D696 Thrombocytopenia, unspecified: Secondary | ICD-10-CM | POA: Diagnosis present

## 2020-04-10 HISTORY — DX: Gout, unspecified: M10.9

## 2020-04-10 HISTORY — DX: Unspecified cirrhosis of liver: K74.60

## 2020-04-10 HISTORY — DX: Vitamin D deficiency, unspecified: E55.9

## 2020-04-10 HISTORY — DX: Chronic diastolic (congestive) heart failure: I50.32

## 2020-04-10 LAB — BLOOD GAS, VENOUS
Acid-Base Excess: 2.9 mmol/L — ABNORMAL HIGH (ref 0.0–2.0)
Bicarbonate: 26.6 mmol/L (ref 20.0–28.0)
FIO2: 21
O2 Saturation: 78.8 %
Patient temperature: 37
pCO2, Ven: 37.9 mmHg — ABNORMAL LOW (ref 44.0–60.0)
pH, Ven: 7.459 — ABNORMAL HIGH (ref 7.250–7.430)
pO2, Ven: 50.7 mmHg — ABNORMAL HIGH (ref 32.0–45.0)

## 2020-04-10 LAB — COMPREHENSIVE METABOLIC PANEL
ALT: 27 U/L (ref 0–44)
AST: 57 U/L — ABNORMAL HIGH (ref 15–41)
Albumin: 2.4 g/dL — ABNORMAL LOW (ref 3.5–5.0)
Alkaline Phosphatase: 164 U/L — ABNORMAL HIGH (ref 38–126)
Anion gap: 8 (ref 5–15)
BUN: 42 mg/dL — ABNORMAL HIGH (ref 8–23)
CO2: 25 mmol/L (ref 22–32)
Calcium: 8.8 mg/dL — ABNORMAL LOW (ref 8.9–10.3)
Chloride: 103 mmol/L (ref 98–111)
Creatinine, Ser: 1.17 mg/dL (ref 0.61–1.24)
GFR, Estimated: >60 mL/min (ref >60)
Glucose, Bld: 138 mg/dL — ABNORMAL HIGH (ref 70–99)
Potassium: 4 mmol/L (ref 3.5–5.1)
Sodium: 136 mmol/L (ref 135–145)
Total Bilirubin: 2.4 mg/dL — ABNORMAL HIGH (ref 0.3–1.2)
Total Protein: 6.1 g/dL — ABNORMAL LOW (ref 6.5–8.1)

## 2020-04-10 LAB — URINALYSIS, ROUTINE W REFLEX MICROSCOPIC
Bacteria, UA: NONE SEEN
Bilirubin Urine: NEGATIVE
Glucose, UA: NEGATIVE mg/dL
Ketones, ur: NEGATIVE mg/dL
Leukocytes,Ua: NEGATIVE
Nitrite: NEGATIVE
Protein, ur: NEGATIVE mg/dL
Specific Gravity, Urine: 1.008 (ref 1.005–1.030)
pH: 6 (ref 5.0–8.0)

## 2020-04-10 LAB — CBC WITH DIFFERENTIAL/PLATELET
Abs Immature Granulocytes: 0.03 10*3/uL (ref 0.00–0.07)
Basophils Absolute: 0 10*3/uL (ref 0.0–0.1)
Basophils Relative: 1 %
Eosinophils Absolute: 0.3 10*3/uL (ref 0.0–0.5)
Eosinophils Relative: 3 %
HCT: 31.2 % — ABNORMAL LOW (ref 39.0–52.0)
Hemoglobin: 11.1 g/dL — ABNORMAL LOW (ref 13.0–17.0)
Immature Granulocytes: 0 %
Lymphocytes Relative: 23 %
Lymphs Abs: 1.9 10*3/uL (ref 0.7–4.0)
MCH: 34 pg (ref 26.0–34.0)
MCHC: 35.6 g/dL (ref 30.0–36.0)
MCV: 95.7 fL (ref 80.0–100.0)
Monocytes Absolute: 0.9 10*3/uL (ref 0.1–1.0)
Monocytes Relative: 11 %
Neutro Abs: 5.2 10*3/uL (ref 1.7–7.7)
Neutrophils Relative %: 62 %
Platelets: 139 10*3/uL — ABNORMAL LOW (ref 150–400)
RBC: 3.26 MIL/uL — ABNORMAL LOW (ref 4.22–5.81)
RDW: 15.7 % — ABNORMAL HIGH (ref 11.5–15.5)
WBC: 8.3 10*3/uL (ref 4.0–10.5)
nRBC: 0 % (ref 0.0–0.2)

## 2020-04-10 LAB — RESP PANEL BY RT-PCR (FLU A&B, COVID) ARPGX2
Influenza A by PCR: NEGATIVE
Influenza B by PCR: NEGATIVE
SARS Coronavirus 2 by RT PCR: NEGATIVE

## 2020-04-10 LAB — BRAIN NATRIURETIC PEPTIDE: B Natriuretic Peptide: 221 pg/mL — ABNORMAL HIGH (ref 0.0–100.0)

## 2020-04-10 LAB — TROPONIN I (HIGH SENSITIVITY): Troponin I (High Sensitivity): 17 ng/L (ref <18)

## 2020-04-10 MED ORDER — ENSURE ENLIVE PO LIQD
237.0000 mL | Freq: Two times a day (BID) | ORAL | Status: DC
  Administered 2020-04-12 – 2020-04-13 (×3): 237 mL via ORAL
  Filled 2020-04-10 (×4): qty 237

## 2020-04-10 MED ORDER — PRESERVISION AREDS PO CAPS: ORAL_CAPSULE | Freq: Two times a day (BID) | ORAL | Status: DC

## 2020-04-10 MED ORDER — SODIUM CHLORIDE 0.9 % IV SOLN
3.0000 g | Freq: Four times a day (QID) | INTRAVENOUS | Status: DC
  Administered 2020-04-10 – 2020-04-13 (×12): 3 g via INTRAVENOUS
  Filled 2020-04-10 (×3): qty 8
  Filled 2020-04-10: qty 3
  Filled 2020-04-10 (×11): qty 8
  Filled 2020-04-10: qty 3
  Filled 2020-04-10 (×2): qty 8

## 2020-04-10 MED ORDER — HEPARIN SODIUM (PORCINE) 5000 UNIT/ML IJ SOLN
5000.0000 [IU] | Freq: Three times a day (TID) | INTRAMUSCULAR | Status: DC
  Administered 2020-04-10 – 2020-04-11 (×3): 5000 [IU] via SUBCUTANEOUS
  Filled 2020-04-10 (×3): qty 1

## 2020-04-10 MED ORDER — ALBUTEROL SULFATE HFA 108 (90 BASE) MCG/ACT IN AERS
2.0000 | INHALATION_SPRAY | RESPIRATORY_TRACT | Status: DC | PRN
  Administered 2020-04-10: 2 via RESPIRATORY_TRACT
  Filled 2020-04-10: qty 6.7

## 2020-04-10 MED ORDER — ONDANSETRON HCL 4 MG/2ML IJ SOLN
4.0000 mg | Freq: Once | INTRAMUSCULAR | Status: AC
  Administered 2020-04-10: 4 mg via INTRAVENOUS
  Filled 2020-04-10: qty 2

## 2020-04-10 MED ORDER — BRIMONIDINE TARTRATE 0.15 % OP SOLN
1.0000 [drp] | Freq: Two times a day (BID) | OPHTHALMIC | Status: DC
  Administered 2020-04-11 – 2020-04-13 (×6): 1 [drp] via OPHTHALMIC
  Filled 2020-04-10: qty 5

## 2020-04-10 MED ORDER — LACTULOSE 10 GM/15ML PO SOLN
10.0000 g | Freq: Every day | ORAL | Status: DC
  Administered 2020-04-12 – 2020-04-13 (×2): 10 g via ORAL
  Filled 2020-04-10 (×3): qty 30

## 2020-04-10 MED ORDER — LATANOPROST 0.005 % OP SOLN
1.0000 [drp] | Freq: Every day | OPHTHALMIC | Status: DC
  Administered 2020-04-10 – 2020-04-12 (×3): 1 [drp] via OPHTHALMIC
  Filled 2020-04-10 (×2): qty 2.5

## 2020-04-10 MED ORDER — BRIMONIDINE TARTRATE 0.2 % OP SOLN
OPHTHALMIC | Status: AC
  Filled 2020-04-10: qty 5

## 2020-04-10 MED ORDER — LEVOTHYROXINE SODIUM 50 MCG PO TABS
50.0000 ug | ORAL_TABLET | Freq: Every day | ORAL | Status: DC
  Administered 2020-04-12 – 2020-04-13 (×2): 50 ug via ORAL
  Filled 2020-04-10 (×3): qty 1

## 2020-04-10 MED ORDER — KETOTIFEN FUMARATE 0.025 % OP SOLN
1.0000 [drp] | Freq: Two times a day (BID) | OPHTHALMIC | Status: DC
  Administered 2020-04-10 – 2020-04-13 (×6): 1 [drp] via OPHTHALMIC
  Filled 2020-04-10: qty 5

## 2020-04-10 NOTE — H&P (Signed)
Gabriel Spencer H&P    Patient Demographics:    Gabriel Spencer, is a 87 y.o. male  MRN: [id number]  DOB - 1931/07/16  Admit Date - 04/10/2020  Referring MD/NP/PA: Sabra Heck  Outpatient Primary MD for the patient is Doree Albee, MD  Patient coming from: [facility]  Chief complaint- hypoxia and altered mental status   HPI:    Gabriel Spencer  is a 87 y.o. male, with history of thyroid disease, macular degeneration, glaucoma, cirrhosis of liver, chronic diastolic heart failure, arthritis, and more presents to the ED for chief complaint of possible aspiration.  Patient presents from the [facility].  It is reported that they noticed patient having episode of emesis, and then patient was progressively short of breath throughout the day after that.  Patient was noted to have had a fever at the [facility] as well.  Patient was transported to the ER for further evaluation.  ED provider also spoke with a relative who reports that at baseline patient is quite chatty and interactive.  He has limitations to his physical abilities given his macular degeneration, but is usually quite clear in the mind per this report.  Today patient is requiring 3 people to help him sit up in bed, and he is only grunting when asked questions during the interview.  It sounds as if he is trying to say yes and no, but what he is actually saying is unclear.  He will not state his name.  Of note patient does have a history of liver cirrhosis and has a as needed lactulose prescription for ammonia over 50.  Last ammonia was January 11 and was 50.  It is unclear if patient received lactulose since that lab draw.  Patient also has a history of thyroid disease.  His last TSH was elevated.  He is on Synthroid.  Patient was recently admitted March 29, 2020 for generalized weakness.  He was found to be in acute on chronic diastolic CHF.  He was discharged with a new  medication-Lasix 20 mg p.o. daily, and sent to the [facility] for short-term skilled nursing.  Patient diuresed over 2-1/2 L while in the hospital, and dry weight was 6 pounds less than admission weight.  He was not started on ACE inhibitor because of soft blood pressures.  He did have an echocardiogram done at that admission with preserved ejection fraction and indeterminate diastolic parameters.  In the ED Temperature 98.4, heart rate 92, respiratory rate 20, blood pressure 123/53, satting at 93%, 4 L nasal cannula Hematology revealed no leukocytosis with a white blood cell count of 8.3, hemoglobin 11.1 Chemistry reveals a creatinine that is near to baseline at 1.17 baseline appears to be about 1.0 Troponin is not elevated at 17 Chest x-ray shows bibasilar opacification with interval worsening of right base likely small effusions with atelectasis infection also possible Zofran was given in the ED Admission requested for further work-up of hypoxia and altered mental status     Review of systems:    Review of Systems  Unable to perform ROS: Mental status change       Past History of the following :    Past Medical History:  Diagnosis Date  . Acute lower UTI 01/10/2019  . Arthritis   . Chronic diastolic (congestive) heart failure ([place])   . Cirrhosis of liver ([place])   . Glaucoma   . Gout   . Macular degeneration   . Thyroid disease   . Vitamin D deficiency       Past Surgical History:  Procedure Laterality Date  . BLADDER SURGERY    . CATARACT EXTRACTION, BILATERAL    . CHOLECYSTECTOMY  11/30/2011   Procedure: LAPAROSCOPIC CHOLECYSTECTOMY;  Surgeon: Jamesetta So, MD;  Location: [facility];  Service: General;  Laterality: N/A;  . CYSTOSCOPY/RETROGRADE/URETEROSCOPY Bilateral 03/10/2015   Procedure: Consuela Mimes BILATERAL Concha Se;  Surgeon: Irine Seal, MD;  Location: [facility];  Service: Urology;  Laterality: Bilateral;  . LYMPH NODE BIOPSY  1975   right  . RETINAL DETACHMENT  SURGERY     right eye  . TRANSURETHRAL RESECTION OF BLADDER TUMOR Bilateral 03/10/2015   Procedure: TRANSURETHRAL RESECTION OF BLADDER TUMOR (TURBT) INSTILLATION WITH MITOMYCIN C IN RECOVERY;  Surgeon: Irine Seal, MD;  Location: [facility];  Service: Urology;  Laterality: Bilateral;  . TYMPANOPLASTY     right      Social History:      Social History   Tobacco Use  . Smoking status: Former Smoker    Quit date: 03/27/1983    Years since quitting: 37.0  . Smokeless tobacco: Never Used  Substance Use Topics  . Alcohol use: No       Family History :     Family History  Problem Relation Age of Onset  . Hypertension Father       Home Medications:   Prior to Admission medications   Medication Sig Start Date End Date Taking? Authorizing Provider  albuterol (PROVENTIL HFA;VENTOLIN HFA) 108 (90 Base) MCG/ACT inhaler Inhale 2 puffs into the lungs every 4 (four) hours as needed for wheezing or shortness of breath. 01/05/17   Noemi Chapel, MD  allopurinol (ZYLOPRIM) 100 MG tablet Take 100 mg by mouth daily.    [provider]  Amino Acids-Protein Hydrolys (FEEDING SUPPLEMENT, PRO-STAT SUGAR FREE 64,) LIQD Take 30 mLs by mouth 3 (three) times daily with meals. for albumin 2.3    [provider]  Janne Lab Oil [facility]) OINT Apply topically in the morning, at noon, and at bedtime. Special Instructions: Apply to sacrum, coccyx and bilateral buttocks qshift for prevention. 04/02/20   [provider]  brimonidine (ALPHAGAN) 0.15 % ophthalmic solution Place 1 drop into both eyes 2 (two) times daily.    [provider]  cholecalciferol (VITAMIN D3) 10 MCG (400 UNIT) TABS tablet Take 400 Units by mouth daily. 04/01/20   [provider]  ketotifen (ZADITOR) 0.025 % ophthalmic solution Place 1 drop into both eyes 2 (two) times daily.    [provider]  lactulose (CHRONULAC) 10 GM/15ML solution Take 10 g by mouth daily. for ammonia level of  50 04/06/20   [provider]  latanoprost (XALATAN) 0.005 % ophthalmic solution Place 1 drop into both eyes at bedtime.    [provider]  levothyroxine (SYNTHROID) 50 MCG tablet Take 50 mcg by mouth daily. 12/24/19   [provider]  meclizine (ANTIVERT) 12.5 MG tablet Take 12.5 mg by mouth 2 (two) times daily as needed.    [provider]  Multiple Vitamins-Minerals (  PRESERVISION AREDS PO) Take 1 tablet by mouth 2 (two) times daily.    [provider]  NON FORMULARY Diet: _____ Regular, ___x___ NAS, _______Consistent Carbohydrate, _______NPO _____Other 04/01/20   [provider]     Allergies:     Allergies  Allergen Reactions  . Cephalexin Hives and Other (See Comments)     Physical Exam:   Vitals  Blood pressure (!) 123/53, pulse 92, temperature 98.4 F (36.9 C), temperature source Oral, resp. rate 20, height 5\' 11"  (1.803 m), weight 91.1 kg, SpO2 93 %.  1.  General: Lying supine in bed, no acute distress  2. Psychiatric: Unable to assess as patient is not able to participate with interview  3. Neurologic: Face is symmetric, unable to get a more thorough exam as patient is not participating with interview  4. HEENMT:  Head is atraumatic, normocephalic, mucous membranes are moist, trachea is midline, neck is cachectic, sclera are anicteric  5. Respiratory : Lungs are clear to auscultation, diminished breath sounds in the lower lung fields, no crackles, no wheezing, no rhonchi  6. Cardiovascular : Heart rate is borderline tachycardic, rhythm is regular, no murmurs rubs or gallops  7. Gastrointestinal:  Abdomen is soft, mildly distended, nontender to palpation  8. Skin:  No acute lesions on limited skin exam  9.Musculoskeletal:  Pitting edema in the lower extremities bilaterally, no acute deformity, no calf tenderness    Data Review:    CBC Recent Labs  Lab 04/05/20 0500 04/10/20 1600  WBC 5.3 8.3  HGB  9.7* 11.1*  HCT 28.5* 31.2*  PLT 98* 139*  MCV 97.6 95.7  MCH 33.2 34.0  MCHC 34.0 35.6  RDW 15.6* 15.7*  LYMPHSABS  --  1.9  MONOABS  --  0.9  EOSABS  --  0.3  BASOSABS  --  0.0   ------------------------------------------------------------------------------------------------------------------  Results for orders placed or performed during the hospital encounter of 04/10/20 (from the past 48 hour(s))  Troponin I (High Sensitivity)     Status: None   Collection Time: 04/10/20  4:00 PM  Result Value Ref Range   Troponin I (High Sensitivity) 17 <18 ng/L    Comment: (NOTE) Elevated high sensitivity troponin I (hsTnI) values and significant  changes across serial measurements may suggest ACS but many other  chronic and acute conditions are known to elevate hsTnI results.  Refer to the "Links" section for chest pain algorithms and additional  guidance. Performed at [facility], [address] [place], [place] [address]   CBC with Differential/Platelet     Status: Abnormal   Collection Time: 04/10/20  4:00 PM  Result Value Ref Range   WBC 8.3 4.0 - 10.5 K/uL   RBC 3.26 (L) 4.22 - 5.81 MIL/uL   Hemoglobin 11.1 (L) 13.0 - 17.0 g/dL   HCT 31.2 (L) 39.0 - 52.0 %   MCV 95.7 80.0 - 100.0 fL   MCH 34.0 26.0 - 34.0 pg   MCHC 35.6 30.0 - 36.0 g/dL   RDW 15.7 (H) 11.5 - 15.5 %   Platelets 139 (L) 150 - 400 K/uL   nRBC 0.0 0.0 - 0.2 %   Neutrophils Relative % 62 %   Neutro Abs 5.2 1.7 - 7.7 K/uL   Lymphocytes Relative 23 %   Lymphs Abs 1.9 0.7 - 4.0 K/uL   Monocytes Relative 11 %   Monocytes Absolute 0.9 0.1 - 1.0 K/uL   Eosinophils Relative 3 %   Eosinophils Absolute 0.3 0.0 - 0.5 K/uL  Basophils Relative 1 %   Basophils Absolute 0.0 0.0 - 0.1 K/uL   Immature Granulocytes 0 %   Abs Immature Granulocytes 0.03 0.00 - 0.07 K/uL    Comment: Performed at [facility], [address], [place], [place] [address]  Comprehensive metabolic panel     Status: Abnormal   Collection Time:  04/10/20  4:00 PM  Result Value Ref Range   Sodium 136 135 - 145 mmol/L   Potassium 4.0 3.5 - 5.1 mmol/L   Chloride 103 98 - 111 mmol/L   CO2 25 22 - 32 mmol/L   Glucose, Bld 138 (H) 70 - 99 mg/dL    Comment: Glucose reference range applies only to samples taken after fasting for at least 8 hours.   BUN 42 (H) 8 - 23 mg/dL   Creatinine, Ser 1.17 0.61 - 1.24 mg/dL   Calcium 8.8 (L) 8.9 - 10.3 mg/dL   Total Protein 6.1 (L) 6.5 - 8.1 g/dL   Albumin 2.4 (L) 3.5 - 5.0 g/dL   AST 57 (H) 15 - 41 U/L   ALT 27 0 - 44 U/L   Alkaline Phosphatase 164 (H) 38 - 126 U/L   Total Bilirubin 2.4 (H) 0.3 - 1.2 mg/dL   GFR, Estimated >60 >60 mL/min    Comment: (NOTE) Calculated using the CKD-EPI Creatinine Equation (2021)    Anion gap 8 5 - 15    Comment: Performed at [facility], [address], [place], [place] [address]  Resp Panel by RT-PCR (Flu A&B, Covid) Nasopharyngeal Swab     Status: None   Collection Time: 04/10/20  4:21 PM   Specimen: Nasopharyngeal Swab; Nasopharyngeal(NP) swabs in vial transport medium  Result Value Ref Range   SARS Coronavirus 2 by RT PCR NEGATIVE NEGATIVE    Comment: (NOTE) SARS-CoV-2 target nucleic acids are NOT DETECTED.  The SARS-CoV-2 RNA is generally detectable in upper respiratory specimens during the acute phase of infection. The lowest concentration of SARS-CoV-2 viral copies this assay can detect is 138 copies/mL. A negative result does not preclude SARS-Cov-2 infection and should not be used as the sole basis for treatment or other patient management decisions. A negative result may occur with  improper specimen collection/handling, submission of specimen other than nasopharyngeal swab, presence of viral mutation(s) within the areas targeted by this assay, and inadequate number of viral copies(<138 copies/mL). A negative result must be combined with clinical observations, patient history, and epidemiological information. The expected result is  Negative.  Fact Sheet for Patients:  EntrepreneurPulse.com.au  Fact Sheet for Healthcare Providers:  IncredibleEmployment.be  This test is no t yet approved or cleared by the [place] FDA and  has been authorized for detection and/or diagnosis of SARS-CoV-2 by FDA under an Emergency Use Authorization (EUA). This EUA will remain  in effect (meaning this test can be used) for the duration of the COVID-19 declaration under Section 564(b)(1) of the Act, 21 U.S.C.section 360bbb-3(b)(1), unless the authorization is terminated  or revoked sooner.       Influenza A by PCR NEGATIVE NEGATIVE   Influenza B by PCR NEGATIVE NEGATIVE    Comment: (NOTE) The Xpert Xpress SARS-CoV-2/FLU/RSV plus assay is intended as an aid in the diagnosis of influenza from Nasopharyngeal swab specimens and should not be used as a sole basis for treatment. Nasal washings and aspirates are unacceptable for Xpert Xpress SARS-CoV-2/FLU/RSV testing.  Fact Sheet for Patients: EntrepreneurPulse.com.au  Fact Sheet for Healthcare Providers: IncredibleEmployment.be  This test is not yet  approved or cleared by the [place] and has been authorized for detection and/or diagnosis of SARS-CoV-2 by FDA under an Emergency Use Authorization (EUA). This EUA will remain in effect (meaning this test can be used) for the duration of the COVID-19 declaration under Section 564(b)(1) of the Act, 21 U.S.C. section 360bbb-3(b)(1), unless the authorization is terminated or revoked.  Performed at [facility], [address], [place], [place] [address]   Urinalysis, Routine w reflex microscopic Urine, Catheterized     Status: Abnormal   Collection Time: 04/10/20  5:01 PM  Result Value Ref Range   Color, Urine YELLOW YELLOW   APPearance CLEAR CLEAR   Specific Gravity, Urine 1.008 1.005 - 1.030   pH 6.0 5.0 - 8.0   Glucose, UA NEGATIVE NEGATIVE  mg/dL   Hgb urine dipstick SMALL (A) NEGATIVE   Bilirubin Urine NEGATIVE NEGATIVE   Ketones, ur NEGATIVE NEGATIVE mg/dL   Protein, ur NEGATIVE NEGATIVE mg/dL   Nitrite NEGATIVE NEGATIVE   Leukocytes,Ua NEGATIVE NEGATIVE   RBC / HPF 11-20 0 - 5 RBC/hpf   WBC, UA 0-5 0 - 5 WBC/hpf   Bacteria, UA NONE SEEN NONE SEEN   Squamous Epithelial / LPF 0-5 0 - 5   Mucus PRESENT     Comment: Performed at [facility], [address] [place], [place] [address]  Blood gas, venous     Status: Abnormal   Collection Time: 04/10/20  5:48 PM  Result Value Ref Range   FIO2 21.00    pH, Ven 7.459 (H) 7.250 - 7.430   pCO2, Ven 37.9 (L) 44.0 - 60.0 mmHg   pO2, Ven 50.7 (H) 32.0 - 45.0 mmHg   Bicarbonate 26.6 20.0 - 28.0 mmol/L   Acid-Base Excess 2.9 (H) 0.0 - 2.0 mmol/L   O2 Saturation 78.8 %   Patient temperature 37.0     Comment: Performed at [facility], [address], [place], [place] [address]    Chemistries  Recent Labs  Lab 04/05/20 0500 04/10/20 1600  NA 136 136  K 4.0 4.0  CL 103 103  CO2 27 25  GLUCOSE 77 138*  BUN 31* 42*  CREATININE 0.91 1.17  CALCIUM 8.2* 8.8*  AST 44* 57*  ALT 19 27  ALKPHOS 122 164*  BILITOT 2.2* 2.4*   ------------------------------------------------------------------------------------------------------------------  ------------------------------------------------------------------------------------------------------------------ GFR: Estimated Creatinine Clearance: 52.3 mL/min (by C-G formula based on SCr of 1.17 mg/dL). Liver Function Tests: Recent Labs  Lab 04/05/20 0500 04/10/20 1600  AST 44* 57*  ALT 19 27  ALKPHOS 122 164*  BILITOT 2.2* 2.4*  PROT 5.3* 6.1*  ALBUMIN 2.0* 2.4*   No results for input(s): LIPASE, AMYLASE in the last 168 hours. Recent Labs  Lab 04/05/20 1030  AMMONIA 50*   Coagulation Profile: No results for input(s): INR, PROTIME in the last 168 hours. Cardiac Enzymes: No results for input(s): CKTOTAL, CKMB,  CKMBINDEX, TROPONINI in the last 168 hours. BNP (last 3 results) No results for input(s): PROBNP in the last 8760 hours. HbA1C: No results for input(s): HGBA1C in the last 72 hours. CBG: No results for input(s): GLUCAP in the last 168 hours. Lipid Profile: No results for input(s): CHOL, HDL, LDLCALC, TRIG, CHOLHDL, LDLDIRECT in the last 72 hours. Thyroid Function Tests: No results for input(s): TSH, T4TOTAL, FREET4, T3FREE, THYROIDAB in the last 72 hours. Anemia Panel: No results for input(s): VITAMINB12, FOLATE, FERRITIN, TIBC, IRON, RETICCTPCT in the last 72 hours.  --------------------------------------------------------------------------------------------------------------- Urine analysis:    Component Value Date/Time  COLORURINE YELLOW 04/10/2020 1701   APPEARANCEUR CLEAR 04/10/2020 1701   LABSPEC 1.008 04/10/2020 1701   PHURINE 6.0 04/10/2020 1701   GLUCOSEU NEGATIVE 04/10/2020 1701   HGBUR SMALL (A) 04/10/2020 1701   BILIRUBINUR NEGATIVE 04/10/2020 1701   KETONESUR NEGATIVE 04/10/2020 1701   PROTEINUR NEGATIVE 04/10/2020 1701   UROBILINOGEN 1.0 01/28/2015 0925   NITRITE NEGATIVE 04/10/2020 1701   LEUKOCYTESUR NEGATIVE 04/10/2020 1701      Imaging Results:    DG Chest Port 1 View  Result Date: 04/10/2020 CLINICAL DATA:  Cough, aspiration.  Pending COVID-19 test. EXAM: PORTABLE CHEST 1 VIEW COMPARISON:  03/29/2020 FINDINGS: Patient is rotated to the right. Lungs are adequately inflated demonstrate persistent bibasilar opacification with worsening in the right base likely small effusions with associated bibasilar atelectasis. Infection over the lung bases is possible. Mild stable cardiomegaly. Remainder of the exam is unchanged. IMPRESSION: Persistent bibasilar opacification with interval worsening in the right base likely small effusions with associated bibasilar atelectasis. Infection over the lung bases is possible. Electronically Signed   By: Marin Olp M.D.   On:  04/10/2020 16:22    My personal review of EKG: Rhythm NSR, Rate 92 /min, QTc borderline elevated at 498,no Acute ST changes   Assessment & Plan:    Principal Problem:   Acute respiratory failure with hypoxia (HCC) Active Problems:   Hypothyroidism   Protein-calorie malnutrition, severe (HCC)   Encephalopathy   Aspiration pneumonia ([place])   1. Acute metabolic encephalopathy 1. Most likely secondary to hypoxia 2. ED provider reports that at presentation patient's oxygen saturation was in the 80s, no oxygen requirement at baseline, requiring 4 L nasal cannula to maintain oxygen saturations here 3. Continue oxygen supplementation 4. Patient also has a history of liver cirrhosis with chronically elevated ammonia, last ammonia was 50 on January 11, ammonia level pending 5. Infection is also on the differential, likely aspiration event, would be consistent with hypoxia-treat as below 6. Continue to monitor 2. Acute respiratory failure with hypoxia 1. Oxygen sats were in the 80s at presentation 2. Likely aspiration event 3. Abnormal chest x-ray indicating either pleural effusions or infection 4. Treating for aspiration pneumonia with Unasyn 5. Continue oxygen supplementation with 4 L nasal cannula 6. Wean off as tolerated 7. RT consult 8. Continue to monitor 3. Aspiration pneumonia 1. Sputum culture 2. Start Unasyn 3. Complete pneumonia work-up for thoroughness 4. Severe protein calorie malnutrition 1. Albumin 2.4 2. Continue feeding supplement when patient is able to tolerate p.o. 3. Continue to trend 5. Elevated T bili 1. Chronically elevated in the setting of liver cirrhosis 2. Continue to trend on CMP 6. Thyroid disease 1. Continue Synthroid 2. In the setting of metabolic encephalopathy-check TSH 7.    DVT Prophylaxis-   Heparin- SCDs  AM Labs Ordered, also please review Full Orders  Family Communication: No family at bedside  Code Status:  Full  Admission status:  Inpatient :The appropriate admission status for this patient is INPATIENT. Inpatient status is judged to be reasonable and necessary in order to provide the required intensity of service to ensure the patient's safety. The patient's presenting symptoms, physical exam findings, and initial radiographic and laboratory data in the context of their chronic comorbidities is felt to place them at high risk for further clinical deterioration. Furthermore, it is not anticipated that the patient will be medically stable for discharge from the hospital within 2 midnights of admission. The following factors support the admission status of inpatient.  The patient's presenting symptoms include altered mental status, shortness of breath The worrisome physical exam findings include hypoxia down to 80s The initial radiographic and laboratory data are worrisome because of bibasilar opacifications that could indicate effusions versus infection The chronic co-morbidities include thyroid disease, macular degeneration, cirrhosis of the liver, chronic diastolic heart failure       * I certify that at the point of admission it is my clinical judgment that the patient will require inpatient hospital care spanning beyond 2 midnights from the point of admission due to high intensity of service, high risk for further deterioration and high frequency of surveillance required.  Time spent in minutes : [place]

## 2020-04-10 NOTE — ED Triage Notes (Signed)
Pt brought over via wheelchair by [facility] staff d/t possible aspiration. They report pt started coughing after breakfast today so they thickened his liquids at lunch and then afterwards he vomited. They report O2 sat dropped down to 88% on RA and pt became less responsive.

## 2020-04-10 NOTE — Progress Notes (Signed)
Received call from RN that patient was coming into Room 3 and had aspirated at the [facility].  Per MD request, the RN had cut O2 off patient to see what sat the patient exhibited on RA.  Patient sat was between 88 and 89%.  Placed patient back on 2L and sat went up to 90.  Patient is currently on 4L to try and keep sat at goal MD has set of 92%.  BS seem fairly clear in upper bases and diminished but clear in lower bases.  Will continue to monitor.

## 2020-04-10 NOTE — Progress Notes (Signed)
Pharmacy Antibiotic Note  Gabriel Spencer is a 87 y.o. male admitted on 04/10/2020 with aspiration pneumonia.  Pharmacy has been consulted for unasyn dosing.  Plan: unasyn 3gm iv q6h  Height: 5\' 11"  (180.3 cm) Weight: 91.1 kg (200 lb 13.4 oz) IBW/kg (Calculated) : 75.3  Temp (24hrs), Avg:98.4 F (36.9 C), Min:98.4 F (36.9 C), Max:98.4 F (36.9 C)  Recent Labs  Lab 04/05/20 0500 04/10/20 1600  WBC 5.3 8.3  CREATININE 0.91 1.17    Estimated Creatinine Clearance: 52.3 mL/min (by C-G formula based on SCr of 1.17 mg/dL).    Allergies  Allergen Reactions  . Cephalexin Hives and Other (See Comments)    Antimicrobials this admission: 1/16 unasyn >>   Microbiology results: 1/16 UCx: sent   1/16 Flu/covid:  Negative   Thank you for allowing pharmacy to be a part of this patient's care.  Donna Christen Lincy Belles 04/10/2020 7:07 PM

## 2020-04-10 NOTE — ED Notes (Signed)
Pt rolled and changed into a clean brief and chuck pad at this time.

## 2020-04-10 NOTE — ED Provider Notes (Signed)
[facility] Provider Note   CSN: [id number] Arrival date & time: 04/10/20  1541     History Chief Complaint  Patient presents with  . Aspiration    Gabriel Spencer is a 87 y.o. male.  HPI   This patient is an 87 year old male, he has a history of encephalopathy, he has chronic congestive heart failure and according to the staff that brings him from the [facility] the patient does not do very much in the way of talking or communicating.  Evidently during breakfast today he had some vomiting and it was thought that he might of aspirated and had some increasing vomiting and difficulty breathing throughout the afternoon prompting his visit to the emergency department.  The patient is unable to give me any information, level 5 caveat applies.  He was noted to be hypoxic and is now requiring oxygen, he does not require oxygen at baseline  D/w daughter - he is legally blind - hearing problems but was functional until mid November when he had  UTI - then was admitted 1/4-1/7 for new onset CHF = progressive weakness - placed in [facility] from the admission and had been there for 9 days.  She reports that he actually is very talkative at baseline, very intelligent, yesterday when I saw him at his facility he was weaker and more tired than usual and felt like his arms were both swollen a little bit.  Past Medical History:  Diagnosis Date  . Acute lower UTI 01/10/2019  . Arthritis   . Chronic diastolic (congestive) heart failure ([place])   . Cirrhosis of liver ([place])   . Glaucoma   . Gout   . Macular degeneration   . Thyroid disease   . Vitamin D deficiency     Patient Active Problem List   Diagnosis Date Noted  . Normochromic normocytic anemia 04/07/2020  . Encephalopathy 04/07/2020  . Chronic idiopathic gout of multiple sites 04/04/2020  . Cirrhosis of liver with ascites ([place]) 04/04/2020  . BPV (benign positional vertigo) 04/04/2020  . Thrombocytopenia ([place])  04/04/2020  . Protein-calorie malnutrition, severe ([place]) 04/04/2020  . Pressure injury of skin 03/30/2020  . Glaucoma 03/30/2020  . Overweight (BMI 25.0-29.9) 03/30/2020  . SOB (shortness of breath) 03/29/2020  . Hypothyroidism 03/29/2020  . Generalized weakness 03/29/2020  . Edema of both legs 03/29/2020  . Pulmonary edema 03/29/2020  . Chronic diastolic CHF (congestive heart failure) ([place]) 03/29/2020  . Acute on chronic diastolic CHF (congestive heart failure) ([place]) 03/29/2020  . LFT elevation   . Syncope 01/10/2019  . History of bladder cancer, 2016 03/10/2015    Past Surgical History:  Procedure Laterality Date  . BLADDER SURGERY    . CATARACT EXTRACTION, BILATERAL    . CHOLECYSTECTOMY  11/30/2011   Procedure: LAPAROSCOPIC CHOLECYSTECTOMY;  Surgeon: Jamesetta So, MD;  Location: [facility];  Service: General;  Laterality: N/A;  . CYSTOSCOPY/RETROGRADE/URETEROSCOPY Bilateral 03/10/2015   Procedure: Consuela Mimes BILATERAL Concha Se;  Surgeon: Irine Seal, MD;  Location: [facility];  Service: Urology;  Laterality: Bilateral;  . LYMPH NODE BIOPSY  1975   right  . RETINAL DETACHMENT SURGERY     right eye  . TRANSURETHRAL RESECTION OF BLADDER TUMOR Bilateral 03/10/2015   Procedure: TRANSURETHRAL RESECTION OF BLADDER TUMOR (TURBT) INSTILLATION WITH MITOMYCIN C IN RECOVERY;  Surgeon: Irine Seal, MD;  Location: [facility];  Service: Urology;  Laterality: Bilateral;  . TYMPANOPLASTY     right       Family  History  Problem Relation Age of Onset  . Hypertension Father     Social History   Tobacco Use  . Smoking status: Former Smoker    Quit date: 03/27/1983    Years since quitting: 37.0  . Smokeless tobacco: Never Used  Vaping Use  . Vaping Use: Never used  Substance Use Topics  . Alcohol use: No  . Drug use: No    Home Medications Prior to Admission medications   Medication Sig Start Date End Date Taking? Authorizing Provider  albuterol (PROVENTIL HFA;VENTOLIN HFA) 108 (90 Base)  MCG/ACT inhaler Inhale 2 puffs into the lungs every 4 (four) hours as needed for wheezing or shortness of breath. 01/05/17   Noemi Chapel, MD  allopurinol (ZYLOPRIM) 100 MG tablet Take 100 mg by mouth daily.    [provider]  Amino Acids-Protein Hydrolys (FEEDING SUPPLEMENT, PRO-STAT SUGAR FREE 64,) LIQD Take 30 mLs by mouth 3 (three) times daily with meals. for albumin 2.3    [provider]  Janne Lab Oil [facility]) OINT Apply topically in the morning, at noon, and at bedtime. Special Instructions: Apply to sacrum, coccyx and bilateral buttocks qshift for prevention. 04/02/20   [provider]  brimonidine (ALPHAGAN) 0.15 % ophthalmic solution Place 1 drop into both eyes 2 (two) times daily.    [provider]  cholecalciferol (VITAMIN D3) 10 MCG (400 UNIT) TABS tablet Take 400 Units by mouth daily. 04/01/20   [provider]  ketotifen (ZADITOR) 0.025 % ophthalmic solution Place 1 drop into both eyes 2 (two) times daily.    [provider]  lactulose (CHRONULAC) 10 GM/15ML solution Take 10 g by mouth daily. for ammonia level of 50 04/06/20   [provider]  latanoprost (XALATAN) 0.005 % ophthalmic solution Place 1 drop into both eyes at bedtime.    [provider]  levothyroxine (SYNTHROID) 50 MCG tablet Take 50 mcg by mouth daily. 12/24/19   [provider]  meclizine (ANTIVERT) 12.5 MG tablet Take 12.5 mg by mouth 2 (two) times daily as needed.    [provider]  Multiple Vitamins-Minerals (PRESERVISION AREDS PO) Take 1 tablet by mouth 2 (two) times daily.    [provider]  NON FORMULARY Diet: _____ Regular, ___x___ NAS, _______Consistent Carbohydrate, _______NPO _____Other 04/01/20   [provider]    Allergies    Cephalexin  Review of Systems   Review of Systems  Unable to perform ROS: Acuity of condition    Physical Exam Updated Vital Signs BP (!) 123/53   Pulse 92    Temp 98.4 F (36.9 C) (Oral)   Resp 20   Ht 1.803 m (5\' 11" )   Wt 91.1 kg   SpO2 93%   BMI 28.01 kg/m   Physical Exam Vitals and nursing note reviewed.  Constitutional:      General: He is in acute distress.     Appearance: He is well-developed and well-nourished. He is ill-appearing.  HENT:     Head: Normocephalic and atraumatic.     Mouth/Throat:     Mouth: Oropharynx is clear and moist.     Pharynx: No oropharyngeal exudate.  Eyes:     General: No scleral icterus.       Right eye: No discharge.        Left eye: No discharge.     Extraocular Movements: EOM normal.     Conjunctiva/sclera: Conjunctivae normal.     Pupils: Pupils are equal, round, and reactive to light.  Neck:     Thyroid: No thyromegaly.     Vascular: No JVD.  Cardiovascular:     Rate and Rhythm: Regular rhythm. Tachycardia present.     Pulses: Intact distal pulses.     Heart sounds: Normal heart sounds. No murmur heard. No friction rub. No gallop.   Pulmonary:     Effort: Respiratory distress present.     Breath sounds: Rhonchi present. No wheezing or rales.  Abdominal:     General: Bowel sounds are normal. There is no distension.     Palpations: Abdomen is soft. There is no mass.     Tenderness: There is no abdominal tenderness.  Musculoskeletal:        General: No tenderness. Normal range of motion.     Cervical back: Normal range of motion and neck supple.     Right lower leg: Edema present.     Left lower leg: Edema present.  Lymphadenopathy:     Cervical: No cervical adenopathy.  Skin:    General: Skin is warm and dry.     Findings: No erythema or rash.  Neurological:     Mental Status: He is alert.     Coordination: Coordination normal.  Psychiatric:        Mood and Affect: Mood and affect normal.        Behavior: Behavior normal.     ED Results / Procedures / Treatments   Labs (all labs ordered are listed, but only abnormal results are displayed) Labs Reviewed  CBC WITH  DIFFERENTIAL/PLATELET - Abnormal; Notable for the following components:      Result Value   RBC 3.26 (*)    Hemoglobin 11.1 (*)    HCT 31.2 (*)    RDW 15.7 (*)    Platelets 139 (*)    All other components within normal limits  COMPREHENSIVE METABOLIC PANEL - Abnormal; Notable for the following components:   Glucose, Bld 138 (*)    BUN 42 (*)    Calcium 8.8 (*)    Total Protein 6.1 (*)    Albumin 2.4 (*)    AST 57 (*)    Alkaline Phosphatase 164 (*)    Total Bilirubin 2.4 (*)    All other components within normal limits  RESP PANEL BY RT-PCR (FLU A&B, COVID) [id number]  URINE CULTURE  URINALYSIS, ROUTINE W REFLEX MICROSCOPIC  TROPONIN I (HIGH SENSITIVITY)    EKG EKG Interpretation  Date/Time:  Sunday April 10 2020 15:57:48 EST Ventricular Rate:  92 PR Interval:    QRS Duration: 125 QT Interval:  400 QTC Calculation: 498 R Axis:   -25 Text Interpretation: Normal sinus rhythm First degree A-V block IVCD, consider atypical LBBB since last tracing no significant change Confirmed by Noemi Chapel [phone]) on 04/10/2020 4:21:05 PM   Radiology DG Chest Port 1 View  Result Date: 04/10/2020 CLINICAL DATA:  Cough, aspiration.  Pending COVID-19 test. EXAM: PORTABLE CHEST 1 VIEW COMPARISON:  03/29/2020 FINDINGS: Patient is rotated to the right. Lungs are adequately inflated demonstrate persistent bibasilar opacification with worsening in the right base likely small effusions with associated bibasilar atelectasis. Infection over the lung bases is possible. Mild stable cardiomegaly. Remainder of the exam is unchanged. IMPRESSION: Persistent bibasilar opacification with interval worsening in the right base likely small effusions with associated bibasilar atelectasis. Infection over the lung bases is possible. Electronically Signed   By: Marin Olp M.D.   On: 04/10/2020 16:22    Procedures Procedures (including critical care time)  Medications Ordered in ED Medications  ondansetron  (ZOFRAN) injection 4 mg (4 mg Intravenous Given 04/10/20 1623)    ED Course  I have reviewed the triage vital signs and the nursing notes.  Pertinent labs & imaging results that were available during my care of the patient were reviewed by me and considered in my medical decision making (see chart for details).    MDM Rules/Calculators/A&P                          The prominent findings include mild bilateral symmetrical edema of the legs pitting below the knees, rhonchi diffusely with mild tachypnea, he actively vomits during the evaluation and seems to be having ongoing mild bits of aspiration, he still has a gag reflex, he is able to nod yes or no, he is able to say simple things such as his name but does not engage in conversation beyond that.  He is mildly tachycardic, he has a disconjugate gaze, he is not able to assist with sitting up in the bed due to weakness and/or chronic deconditioning  The patient will need evaluation for the cause of his symptoms, the cause of the vomiting is unlikely to be the aspiration as that may have been the result of the vomiting.  I have personally viewed the patient's chest x-ray, there is some obscuring of the diaphragms, otherwise the lung fields appear very similar to what they did on January 4.  The EKG shows normal sinus rhythm with some ectopy, no significant findings of ischemia  Discussed with hospitliast who will admit.  Supplemental O2 needed - antibiotics given.  Final Clinical Impression(s) / ED Diagnoses Final diagnoses:  Hypoxia  Aspiration into airway, initial encounter      Noemi Chapel, MD 04/10/20 [phone]

## 2020-04-10 NOTE — ED Notes (Signed)
Pericare performed, brief changed, pt had incontinent stool x 1. Pt turned, tolerated well.

## 2020-04-10 NOTE — ED Notes (Signed)
Inpatient status, vital signs will be obtained accordingly.

## 2020-04-11 ENCOUNTER — Inpatient Hospital Stay (HOSPITAL_COMMUNITY): Payer: Medicare PPO

## 2020-04-11 ENCOUNTER — Encounter (HOSPITAL_COMMUNITY): Payer: Self-pay | Admitting: Family Medicine

## 2020-04-11 LAB — COMPREHENSIVE METABOLIC PANEL
ALT: 24 U/L (ref 0–44)
AST: 45 U/L — ABNORMAL HIGH (ref 15–41)
Albumin: 2 g/dL — ABNORMAL LOW (ref 3.5–5.0)
Alkaline Phosphatase: 136 U/L — ABNORMAL HIGH (ref 38–126)
Anion gap: 6 (ref 5–15)
BUN: 41 mg/dL — ABNORMAL HIGH (ref 8–23)
CO2: 28 mmol/L (ref 22–32)
Calcium: 8.7 mg/dL — ABNORMAL LOW (ref 8.9–10.3)
Chloride: 105 mmol/L (ref 98–111)
Creatinine, Ser: 1.14 mg/dL (ref 0.61–1.24)
GFR, Estimated: >60 mL/min (ref >60)
Glucose, Bld: 118 mg/dL — ABNORMAL HIGH (ref 70–99)
Potassium: 4.1 mmol/L (ref 3.5–5.1)
Sodium: 139 mmol/L (ref 135–145)
Total Bilirubin: 2.4 mg/dL — ABNORMAL HIGH (ref 0.3–1.2)
Total Protein: 5.2 g/dL — ABNORMAL LOW (ref 6.5–8.1)

## 2020-04-11 LAB — CBC WITH DIFFERENTIAL/PLATELET
Abs Immature Granulocytes: 0.04 10*3/uL (ref 0.00–0.07)
Basophils Absolute: 0 10*3/uL (ref 0.0–0.1)
Basophils Relative: 0 %
Eosinophils Absolute: 0.1 10*3/uL (ref 0.0–0.5)
Eosinophils Relative: 1 %
HCT: 27.8 % — ABNORMAL LOW (ref 39.0–52.0)
Hemoglobin: 9.5 g/dL — ABNORMAL LOW (ref 13.0–17.0)
Immature Granulocytes: 0 %
Lymphocytes Relative: 11 %
Lymphs Abs: 1.2 10*3/uL (ref 0.7–4.0)
MCH: 33.3 pg (ref 26.0–34.0)
MCHC: 34.2 g/dL (ref 30.0–36.0)
MCV: 97.5 fL (ref 80.0–100.0)
Monocytes Absolute: 0.8 10*3/uL (ref 0.1–1.0)
Monocytes Relative: 7 %
Neutro Abs: 9 10*3/uL — ABNORMAL HIGH (ref 1.7–7.7)
Neutrophils Relative %: 81 %
Platelets: 109 10*3/uL — ABNORMAL LOW (ref 150–400)
RBC: 2.85 MIL/uL — ABNORMAL LOW (ref 4.22–5.81)
RDW: 15.7 % — ABNORMAL HIGH (ref 11.5–15.5)
WBC: 11.2 10*3/uL — ABNORMAL HIGH (ref 4.0–10.5)
nRBC: 0 % (ref 0.0–0.2)

## 2020-04-11 LAB — TSH: TSH: 2.191 u[IU]/mL (ref 0.350–4.500)

## 2020-04-11 LAB — STREP PNEUMONIAE URINARY ANTIGEN: Strep Pneumo Urinary Antigen: NEGATIVE

## 2020-04-11 LAB — AMMONIA
Ammonia: 43 umol/L — ABNORMAL HIGH (ref 9–35)
Ammonia: 25 umol/L (ref 9–35)

## 2020-04-11 LAB — HIV ANTIBODY (ROUTINE TESTING W REFLEX): HIV Screen 4th Generation wRfx: NONREACTIVE

## 2020-04-11 LAB — MAGNESIUM: Magnesium: 1.9 mg/dL (ref 1.7–2.4)

## 2020-04-11 NOTE — ED Notes (Signed)
Cleansed pt, pt noted to have a skin abrasion with covering right side groin area.

## 2020-04-11 NOTE — Progress Notes (Signed)
PROGRESS NOTE    Gabriel Spencer  [id number] DOB: Jul 23, 1931 DOA: 04/10/2020 PCP: Doree Albee, MD   Brief Narrative:  Gabriel Spencer  is a 87 y.o. male, with history of thyroid disease, macular degeneration, glaucoma, cirrhosis of liver, chronic diastolic heart failure, arthritis, and more presents to the ED for chief complaint of possible aspiration.  Patient presents from the [facility].  It is reported that they noticed patient having episode of emesis, and then patient was progressively short of breath throughout the day after that.  Patient was admitted with acute multifactorial encephalopathy in the setting of hypoxemia as well as his metabolic causes with suspected aspiration pneumonia.  He has been started on Unasyn for treatment empirically.  Assessment & Plan:   Principal Problem:   Acute respiratory failure with hypoxia (HCC) Active Problems:   Hypothyroidism   Protein-calorie malnutrition, severe (HCC)   Encephalopathy   Aspiration pneumonia (HCC)   Acute encephalopathy-multifactorial -Appears to be related to mostly hypoxemia/aspiration -Continue treatment as noted below -Keep n.p.o. until more alert and oriented -Normally talkative and oriented at baseline  Acute hypoxemic respiratory failure secondary to aspiration pneumonia -Wean oxygen as tolerated -Continue Unasyn for empiric treatment -Follow sputum culture  Severe protein calorie malnutrition -Albumin 2.4 -Continue feeding supplement when patient is able to tolerate p.o.  Liver cirrhosis with elevated T bili -Continue monitoring  Chronic diastolic congestive heart failure -Does not appear to be significantly volume overloaded on exam -Chest x-ray, however with pleural effusions and BNP with mild elevation -Avoid IV fluid for now  Hypothyroidism -Continue Synthroid -TSH 2.19   DVT prophylaxis:Heparin, now to SCDs Code Status: Full Family Communication: Discussed with daughter Manuela Schwartz on phone  1/17 Disposition Plan:  Status is: Inpatient  Remains inpatient appropriate because:Altered mental status, IV treatments appropriate due to intensity of illness or inability to take PO and Inpatient level of care appropriate due to severity of illness   Dispo: The patient is from: SNF              Anticipated d/c is to: SNF              Anticipated d/c date is: 2 days              Patient currently is not medically stable to d/c. Pt continues to have AMS.   Nutritional Assessment:  The patient's BMI is: Body mass index is 28.01 kg/m.Marland Kitchen   Skin Assessment:  I have examined the patient's skin and I agree with the wound assessment as performed by the wound care RN as outlined below:  Pressure Injury 03/29/20 Buttocks Right;Left Stage 2 -  Partial thickness loss of dermis presenting as a shallow open injury with a red, pink wound bed without slough. clean, dry, pink, scant old drainage, (Active)  03/29/20 2100  Location: Buttocks  Location Orientation: Right;Left  Staging: Stage 2 -  Partial thickness loss of dermis presenting as a shallow open injury with a red, pink wound bed without slough.  Wound Description (Comments): clean, dry, pink, scant old drainage,  Present on Admission: Yes    Consultants:   None  Procedures:   See below  Antimicrobials:  Anti-infectives (From admission, onward)   Start     Dose/Rate Route Frequency Ordered Stop   04/10/20 1915  Ampicillin-Sulbactam (UNASYN) 3 g in sodium chloride 0.9 % 100 mL IVPB        3 g 200 mL/hr over 30 Minutes Intravenous Every 6 hours 04/10/20 1907  Subjective: Patient seen and evaluated today and remains quite confused and somnolent.  Objective: Vitals:   04/11/20 0500 04/11/20 0700 04/11/20 0800 04/11/20 1000  BP: (!) 112/49 130/70 130/70 (!) 109/47  Pulse: (!) 106 88 (!) 49 78  Resp: 18 14  (!) 21  Temp:  (!) 97.2 F (36.2 C)    TempSrc:  Oral    SpO2: (!) 82% 92% (!) 81% 92%  Weight:       Height:        Intake/Output Summary (Last 24 hours) at 04/11/2020 1049 Last data filed at 04/11/2020 0818 Gross per 24 hour  Intake 300 ml  Output 800 ml  Net -500 ml   Filed Weights   04/10/20 1600  Weight: 91.1 kg    Examination:  General exam: Appears calm and comfortable, somnolent Respiratory system: Clear to auscultation. Respiratory effort normal.  Currently on nasal cannula O2-3 L. Cardiovascular system: S1 & S2 heard, RRR.  Gastrointestinal system: Abdomen is soft Central nervous system: Somnolent, confused Extremities: No edema Skin: No significant lesions noted Psychiatry: Flat affect.    Data Reviewed: I have personally reviewed following labs and imaging studies  CBC: Recent Labs  Lab 04/05/20 0500 04/10/20 1600 04/11/20 0430  WBC 5.3 8.3 11.2*  NEUTROABS  --  5.2 9.0*  HGB 9.7* 11.1* 9.5*  HCT 28.5* 31.2* 27.8*  MCV 97.6 95.7 97.5  PLT 98* 139* 778*   Basic Metabolic Panel: Recent Labs  Lab 04/05/20 0500 04/10/20 1600 04/11/20 0430  NA 136 136 139  K 4.0 4.0 4.1  CL 103 103 105  CO2 27 25 28   GLUCOSE 77 138* 118*  BUN 31* 42* 41*  CREATININE 0.91 1.17 1.14  CALCIUM 8.2* 8.8* 8.7*  MG  --   --  1.9   GFR: Estimated Creatinine Clearance: 52.7 mL/min (by C-G formula based on SCr of 1.14 mg/dL). Liver Function Tests: Recent Labs  Lab 04/05/20 0500 04/10/20 1600 04/11/20 0430  AST 44* 57* 45*  ALT 19 27 24   ALKPHOS 122 164* 136*  BILITOT 2.2* 2.4* 2.4*  PROT 5.3* 6.1* 5.2*  ALBUMIN 2.0* 2.4* 2.0*   No results for input(s): LIPASE, AMYLASE in the last 168 hours. Recent Labs  Lab 04/05/20 1030 04/11/20 0430 04/11/20 0646  AMMONIA 50* 43* 25   Coagulation Profile: No results for input(s): INR, PROTIME in the last 168 hours. Cardiac Enzymes: No results for input(s): CKTOTAL, CKMB, CKMBINDEX, TROPONINI in the last 168 hours. BNP (last 3 results) No results for input(s): PROBNP in the last 8760 hours. HbA1C: No results for  input(s): HGBA1C in the last 72 hours. CBG: No results for input(s): GLUCAP in the last 168 hours. Lipid Profile: No results for input(s): CHOL, HDL, LDLCALC, TRIG, CHOLHDL, LDLDIRECT in the last 72 hours. Thyroid Function Tests: Recent Labs    04/11/20 0430  TSH 2.191   Anemia Panel: No results for input(s): VITAMINB12, FOLATE, FERRITIN, TIBC, IRON, RETICCTPCT in the last 72 hours. Sepsis Labs: No results for input(s): PROCALCITON, LATICACIDVEN in the last 168 hours.  Recent Results (from the past 240 hour(s))  Resp Panel by RT-PCR (Flu A&B, Covid) Nasopharyngeal Swab     Status: None   Collection Time: 04/10/20  4:21 PM   Specimen: Nasopharyngeal Swab; Nasopharyngeal(NP) swabs in vial transport medium  Result Value Ref Range Status   SARS Coronavirus 2 by RT PCR NEGATIVE NEGATIVE Final    Comment: (NOTE) SARS-CoV-2 target nucleic acids are NOT DETECTED.  The SARS-CoV-2  RNA is generally detectable in upper respiratory specimens during the acute phase of infection. The lowest concentration of SARS-CoV-2 viral copies this assay can detect is 138 copies/mL. A negative result does not preclude SARS-Cov-2 infection and should not be used as the sole basis for treatment or other patient management decisions. A negative result may occur with  improper specimen collection/handling, submission of specimen other than nasopharyngeal swab, presence of viral mutation(s) within the areas targeted by this assay, and inadequate number of viral copies(<138 copies/mL). A negative result must be combined with clinical observations, patient history, and epidemiological information. The expected result is Negative.  Fact Sheet for Patients:  EntrepreneurPulse.com.au  Fact Sheet for Healthcare Providers:  IncredibleEmployment.be  This test is no t yet approved or cleared by the [place] FDA and  has been authorized for detection and/or diagnosis of  SARS-CoV-2 by FDA under an Emergency Use Authorization (EUA). This EUA will remain  in effect (meaning this test can be used) for the duration of the COVID-19 declaration under Section 564(b)(1) of the Act, 21 U.S.C.section 360bbb-3(b)(1), unless the authorization is terminated  or revoked sooner.       Influenza A by PCR NEGATIVE NEGATIVE Final   Influenza B by PCR NEGATIVE NEGATIVE Final    Comment: (NOTE) The Xpert Xpress SARS-CoV-2/FLU/RSV plus assay is intended as an aid in the diagnosis of influenza from Nasopharyngeal swab specimens and should not be used as a sole basis for treatment. Nasal washings and aspirates are unacceptable for Xpert Xpress SARS-CoV-2/FLU/RSV testing.  Fact Sheet for Patients: EntrepreneurPulse.com.au  Fact Sheet for Healthcare Providers: IncredibleEmployment.be  This test is not yet approved or cleared by the [place] FDA and has been authorized for detection and/or diagnosis of SARS-CoV-2 by FDA under an Emergency Use Authorization (EUA). This EUA will remain in effect (meaning this test can be used) for the duration of the COVID-19 declaration under Section 564(b)(1) of the Act, 21 U.S.C. section 360bbb-3(b)(1), unless the authorization is terminated or revoked.  Performed at [facility], [address], [place], [place] [address]          Radiology Studies: [facility] Chest [place] 1 View  Result Date: 04/11/2020 CLINICAL DATA:  87 year old male with shortness of breath. Cirrhosis. Negative for COVID-19 04/10/2020. EXAM: PORTABLE CHEST 1 VIEW COMPARISON:  Portable chest 04/10/2020 and earlier. FINDINGS: Portable AP upright view at 0547 hours. Small bilateral pleural effusions with mildly decreased veiling opacity at both lung bases from yesterday. Mediastinal contours remain normal. Pulmonary vascularity also appears mildly decreased, no overt edema. No air bronchograms or pneumothorax identified. Stable  visible bowel gas pattern, cholecystectomy clips. No acute osseous abnormality identified. IMPRESSION: 1. Small bilateral pleural effusions perhaps mildly decreased since yesterday. Lung base atelectasis. 2. No overt edema.  No new cardiopulmonary abnormality. Electronically Signed   By: Genevie Ann M.D.   On: 04/11/2020 06:32   DG Chest Port 1 View  Result Date: 04/10/2020 CLINICAL DATA:  Cough, aspiration.  Pending COVID-19 test. EXAM: PORTABLE CHEST 1 VIEW COMPARISON:  03/29/2020 FINDINGS: Patient is rotated to the right. Lungs are adequately inflated demonstrate persistent bibasilar opacification with worsening in the right base likely small effusions with associated bibasilar atelectasis. Infection over the lung bases is possible. Mild stable cardiomegaly. Remainder of the exam is unchanged. IMPRESSION: Persistent bibasilar opacification with interval worsening in the right base likely small effusions with associated bibasilar atelectasis. Infection over the lung bases is possible. Electronically Signed   By: Marin Olp M.D.  On: 04/10/2020 16:22        Scheduled Meds: . brimonidine  1 drop Both Eyes BID  . feeding supplement  237 mL Oral BID BM  . ketotifen  1 drop Both Eyes BID  . lactulose  10 g Oral Daily  . latanoprost  1 drop Both Eyes QHS  . levothyroxine  50 mcg Oral Daily   Continuous Infusions: . ampicillin-sulbactam (UNASYN) IV Stopped (04/11/20 0818)     LOS: 1 day    Time spent: 35 minutes    Lucielle Vokes D Manuella Ghazi, DO [facility]  If 7PM-7AM, please contact night-coverage [email or web address] 04/11/2020, 10:49 AM

## 2020-04-11 NOTE — ED Notes (Signed)
Pt repositioned and cleansed

## 2020-04-12 ENCOUNTER — Other Ambulatory Visit: Payer: Self-pay

## 2020-04-12 LAB — IRON AND TIBC
Iron: 51 ug/dL (ref 45–182)
Saturation Ratios: 34 % (ref 17.9–39.5)
TIBC: 151 ug/dL — ABNORMAL LOW (ref 250–450)
UIBC: 100 ug/dL

## 2020-04-12 LAB — RETICULOCYTES
Immature Retic Fract: 20.5 % — ABNORMAL HIGH (ref 2.3–15.9)
RBC.: 3.2 MIL/uL — ABNORMAL LOW (ref 4.22–5.81)
Retic Count, Absolute: 63 10*3/uL (ref 19.0–186.0)
Retic Ct Pct: 2 % (ref 0.4–3.1)

## 2020-04-12 LAB — CBC
HCT: 32.3 % — ABNORMAL LOW (ref 39.0–52.0)
Hemoglobin: 10.7 g/dL — ABNORMAL LOW (ref 13.0–17.0)
MCH: 33 pg (ref 26.0–34.0)
MCHC: 33.1 g/dL (ref 30.0–36.0)
MCV: 99.7 fL (ref 80.0–100.0)
Platelets: 123 10*3/uL — ABNORMAL LOW (ref 150–400)
RBC: 3.24 MIL/uL — ABNORMAL LOW (ref 4.22–5.81)
RDW: 15.9 % — ABNORMAL HIGH (ref 11.5–15.5)
WBC: 8.4 10*3/uL (ref 4.0–10.5)
nRBC: 0 % (ref 0.0–0.2)

## 2020-04-12 LAB — URINALYSIS, ROUTINE W REFLEX MICROSCOPIC
Bacteria, UA: NONE SEEN
Bilirubin Urine: NEGATIVE
Glucose, UA: NEGATIVE mg/dL
Ketones, ur: NEGATIVE mg/dL
Nitrite: NEGATIVE
Protein, ur: 100 mg/dL — AB
RBC / HPF: >50 RBC/hpf — ABNORMAL HIGH (ref 0–5)
Specific Gravity, Urine: 1.023 (ref 1.005–1.030)
WBC, UA: >50 WBC/hpf — ABNORMAL HIGH (ref 0–5)
pH: 6 (ref 5.0–8.0)

## 2020-04-12 LAB — COMPREHENSIVE METABOLIC PANEL
ALT: 26 U/L (ref 0–44)
AST: 55 U/L — ABNORMAL HIGH (ref 15–41)
Albumin: 2.3 g/dL — ABNORMAL LOW (ref 3.5–5.0)
Alkaline Phosphatase: 139 U/L — ABNORMAL HIGH (ref 38–126)
Anion gap: 9 (ref 5–15)
BUN: 35 mg/dL — ABNORMAL HIGH (ref 8–23)
CO2: 27 mmol/L (ref 22–32)
Calcium: 9 mg/dL (ref 8.9–10.3)
Chloride: 106 mmol/L (ref 98–111)
Creatinine, Ser: 1.08 mg/dL (ref 0.61–1.24)
GFR, Estimated: >60 mL/min (ref >60)
Glucose, Bld: 78 mg/dL (ref 70–99)
Potassium: 3.8 mmol/L (ref 3.5–5.1)
Sodium: 142 mmol/L (ref 135–145)
Total Bilirubin: 2.9 mg/dL — ABNORMAL HIGH (ref 0.3–1.2)
Total Protein: 6 g/dL — ABNORMAL LOW (ref 6.5–8.1)

## 2020-04-12 LAB — URINE CULTURE: Culture: <10000 — AB

## 2020-04-12 LAB — FOLATE: Folate: 20.6 ng/mL (ref >5.9)

## 2020-04-12 LAB — VITAMIN B12: Vitamin B-12: 596 pg/mL (ref 180–914)

## 2020-04-12 LAB — AMMONIA: Ammonia: 27 umol/L (ref 9–35)

## 2020-04-12 LAB — MAGNESIUM: Magnesium: 1.8 mg/dL (ref 1.7–2.4)

## 2020-04-12 LAB — LEGIONELLA PNEUMOPHILA SEROGP 1 UR AG: L. pneumophila Serogp 1 Ur Ag: NEGATIVE

## 2020-04-12 LAB — FERRITIN: Ferritin: 257 ng/mL (ref 24–336)

## 2020-04-12 MED ORDER — CHLORHEXIDINE GLUCONATE 0.12 % MT SOLN
15.0000 mL | Freq: Two times a day (BID) | OROMUCOSAL | Status: DC
  Administered 2020-04-12 – 2020-04-13 (×3): 15 mL via OROMUCOSAL
  Filled 2020-04-12 (×3): qty 15

## 2020-04-12 MED ORDER — ORAL CARE MOUTH RINSE
15.0000 mL | Freq: Two times a day (BID) | OROMUCOSAL | Status: DC
  Administered 2020-04-12 – 2020-04-13 (×3): 15 mL via OROMUCOSAL

## 2020-04-12 NOTE — Evaluation (Signed)
Clinical/Bedside Swallow Evaluation Patient Details  Name: Gabriel Spencer MRN: [id number] Date of Birth: Feb 22, 1932  Today's Date: 04/12/2020 Time: SLP Start Time (ACUTE ONLY): 78 SLP Stop Time (ACUTE ONLY): 1255 SLP Time Calculation (min) (ACUTE ONLY): 25 min  Past Medical History:  Past Medical History:  Diagnosis Date  . Acute lower UTI 01/10/2019  . Arthritis   . Chronic diastolic (congestive) heart failure ([place])   . Cirrhosis of liver ([place])   . Glaucoma   . Gout   . Macular degeneration   . Thyroid disease   . Vitamin D deficiency    Past Surgical History:  Past Surgical History:  Procedure Laterality Date  . BLADDER SURGERY    . CATARACT EXTRACTION, BILATERAL    . CHOLECYSTECTOMY  11/30/2011   Procedure: LAPAROSCOPIC CHOLECYSTECTOMY;  Surgeon: Jamesetta So, MD;  Location: [facility];  Service: General;  Laterality: N/A;  . CYSTOSCOPY/RETROGRADE/URETEROSCOPY Bilateral 03/10/2015   Procedure: Consuela Mimes BILATERAL Concha Se;  Surgeon: Irine Seal, MD;  Location: [facility];  Service: Urology;  Laterality: Bilateral;  . LYMPH NODE BIOPSY  1975   right  . RETINAL DETACHMENT SURGERY     right eye  . TRANSURETHRAL RESECTION OF BLADDER TUMOR Bilateral 03/10/2015   Procedure: TRANSURETHRAL RESECTION OF BLADDER TUMOR (TURBT) INSTILLATION WITH MITOMYCIN C IN RECOVERY;  Surgeon: Irine Seal, MD;  Location: [facility];  Service: Urology;  Laterality: Bilateral;  . TYMPANOPLASTY     right   HPI:  Gabriel Spencer  is a 87 y.o. male, with history of thyroid disease, macular degeneration, glaucoma, cirrhosis of liver, chronic diastolic heart failure, arthritis, and more presents to the ED for chief complaint of possible aspiration.  Patient presents from the [facility].  It is reported that they noticed patient having episode of emesis, and then patient was progressively short of breath throughout the day after that.  Patient was admitted with acute multifactorial encephalopathy in the setting of  hypoxemia as well as his metabolic causes with suspected aspiration pneumonia.  He has been started on Unasyn for treatment empirically.  He continues to remain encephalopathic and diet has not been advanced.  SLP evaluation pending.  Also now noted to have hematuria. BSE requested. Pt is on full liquids at this time.   Assessment / Plan / Recommendation Clinical Impression  Clinical swallow evaluation completed at bedside with daughter present. Pt and daughter deny history of dysphagia. Per report, Pt was eating breakfast at [facility] and started vomiting and aspiration was suspected. Oral motor examination is WNL, Pt with low vocal intensity and appears to be a mouth breather. Pt without overt signs or symptoms of reduced airway protection with trials presented today. His daughter fed him lunch and indicated the same. She has concerns that Pt will be unable to feed himself in the hospital environment unless someone sets up the tray for him due to low vision. Recommend advancing to D3/mech soft and thin liquids with set up assistance and intermittent supervision, po medications whole with water. SLP will follow in acute setting for diet tolerance, education, and upgrades as appropriate. Daughter appreciative of visit.   SLP Visit Diagnosis: Dysphagia, unspecified (R13.10)    Aspiration Risk  Mild aspiration risk    Diet Recommendation Dysphagia 3 (Mech soft);Thin liquid   Liquid Administration via: Cup;Straw Medication Administration: Whole meds with liquid Supervision: Intermittent supervision to cue for compensatory strategies;Staff to assist with self feeding (Pt will need set up assistance due to limited vision) Compensations: Slow rate;Small sips/bites Postural  Changes: Seated upright at 90 degrees;Remain upright for at least 30 minutes after po intake    Other  Recommendations Oral Care Recommendations: Oral care BID;Staff/trained caregiver to provide oral care Other Recommendations: Clarify  dietary restrictions   Follow up Recommendations 24 hour supervision/assistance      Frequency and Duration min 2x/week  1 week       Prognosis Prognosis for Safe Diet Advancement: Good      Swallow Study   General Date of Onset: 04/10/20 HPI: Gabriel Spencer  is a 87 y.o. male, with history of thyroid disease, macular degeneration, glaucoma, cirrhosis of liver, chronic diastolic heart failure, arthritis, and more presents to the ED for chief complaint of possible aspiration.  Patient presents from the [facility].  It is reported that they noticed patient having episode of emesis, and then patient was progressively short of breath throughout the day after that.  Patient was admitted with acute multifactorial encephalopathy in the setting of hypoxemia as well as his metabolic causes with suspected aspiration pneumonia.  He has been started on Unasyn for treatment empirically.  He continues to remain encephalopathic and diet has not been advanced.  SLP evaluation pending.  Also now noted to have hematuria. BSE requested. Pt is on full liquids at this time. Type of Study: Bedside Swallow Evaluation Previous Swallow Assessment: None on record Diet Prior to this Study:  (full liquids) Temperature Spikes Noted: No Respiratory Status: Room air History of Recent Intubation: No Behavior/Cognition: Alert;Cooperative;Pleasant mood Oral Cavity Assessment: Within Functional Limits Oral Care Completed by SLP: Yes Oral Cavity - Dentition: Adequate natural dentition (missing some) Vision: Impaired for self-feeding (needs set up assistance) Self-Feeding Abilities: Able to feed self;Needs set up Patient Positioning: Upright in bed Baseline Vocal Quality: Normal Volitional Cough: Weak Volitional Swallow: Able to elicit    Oral/Motor/Sensory Function Overall Oral Motor/Sensory Function: Within functional limits   Ice Chips Ice chips: Within functional limits Presentation: Spoon   Thin Liquid Thin  Liquid: Within functional limits Presentation: Cup;Self Fed;Straw    Nectar Thick Nectar Thick Liquid: Not tested   Honey Thick Honey Thick Liquid: Not tested   Puree Puree: Within functional limits   Solid     Solid: Within functional limits Presentation: Self Fed     Thank you,  Genene Churn, [place] 04/12/2020,1:42 PM

## 2020-04-12 NOTE — Progress Notes (Signed)
PROGRESS NOTE    Gabriel Spencer  MVE:[id number] DOB: 1931/06/01 DOA: 04/10/2020 PCP: Wilson Singer, MD   Brief Narrative:  Gabriel Spencer y.o.male,with history of thyroid disease, macular degeneration, glaucoma, cirrhosis of liver, chronic diastolic heart failure, arthritis, and more presents to the ED for chief complaint of possible aspiration. Patient presents from the [facility]. It is reported that they noticed patient having episode of emesis, and then patient was progressively short of breath throughout the day after that.  Patient was admitted with acute multifactorial encephalopathy in the setting of hypoxemia as well as his metabolic causes with suspected aspiration pneumonia.  He has been started on Unasyn for treatment empirically.  He continues to remain encephalopathic and diet has not been advanced.  SLP evaluation pending.  Also now noted to have hematuria.  Assessment & Plan:   Principal Problem:   Acute respiratory failure with hypoxia (HCC) Active Problems:   Hypothyroidism   Protein-calorie malnutrition, severe (HCC)   Encephalopathy   Aspiration pneumonia (HCC)   Acute encephalopathy-multifactorial; slowly improving -Appears to be related to mostly hypoxemia/aspiration -Continue treatment as noted below -Did relatively well with bedside swallow evaluation, start full liquids -Normally talkative and oriented at baseline  Acute hypoxemic respiratory failure secondary to aspiration pneumonia -Wean oxygen as tolerated-improving and will wean further to room air as tolerated today -Continue Unasyn for empiric treatment -Follow sputum culture  Severe protein calorie malnutrition -Albumin 2.4 -Continue feeding supplement when patient is able to tolerate p.o. -Ensure supplementation  Anemia -No overt bleeding noted -Monitor CBC which has remained stable -Anemia panel  Thrombocytopenia -Avoid heparin agents -Likely related to liver cirrhosis,  stable  Liver cirrhosis with elevated T bili -Continue monitoring -Currently remaining stable  Chronic diastolic congestive heart failure -Does not appear to be significantly volume overloaded on exam -Chest x-ray, however with pleural effusions and BNP with mild elevation -Avoid IV fluid for now  Hypothyroidism -Continue Synthroid -TSH 2.19   DVT prophylaxis:SCDs Code Status: Full Family Communication: Discussed with granddaughter on phone 1/18 Disposition Plan:  Status is: Inpatient  Remains inpatient appropriate because:Altered mental status, IV treatments appropriate due to intensity of illness or inability to take PO and Inpatient level of care appropriate due to severity of illness   Dispo: The patient is from: SNF  Anticipated d/c is to: SNF  Anticipated d/c date is: 1-2 days  Patient currently is not medically stable to d/c. Pt continues to have AMS that is slowly improving, SLP evaluation pending for further diet advancement.  May discharge once tolerating diet and has been weaned off oxygen.   Nutritional Assessment:  The patient's BMI is: Body mass index is 28.01 kg/m.Marland Kitchen   Skin Assessment:  I have examined the patient's skin and I agree with the wound assessment as performed by the wound care RN as outlined below:  Pressure Injury 03/29/20 Buttocks Right;Left Stage 2 -  Partial thickness loss of dermis presenting as a shallow open injury with a red, pink wound bed without slough. clean, dry, pink, scant old drainage, (Active)  03/29/20 2100  Location: Buttocks  Location Orientation: Right;Left  Staging: Stage 2 -  Partial thickness loss of dermis presenting as a shallow open injury with a red, pink wound bed without slough.  Wound Description (Comments): clean, dry, pink, scant old drainage,  Present on Admission: Yes    Consultants:   None  Procedures:   See below  Antimicrobials:   Anti-infectives (From admission, onward)   Start  Dose/Rate Route Frequency Ordered Stop   04/10/20 1915  Ampicillin-Sulbactam (UNASYN) 3 g in sodium chloride 0.9 % 100 mL IVPB        3 g 200 mL/hr over 30 Minutes Intravenous Every 6 hours 04/10/20 1907         Subjective: Patient seen and evaluated today and appears to be having periods of increased alertness and is tolerating some sips of fluid.  Oxygenation improving.  Objective: Vitals:   04/11/20 2024 04/11/20 2321 04/11/20 2326 04/12/20 0425  BP: (!) 122/59  (!) 110/53 (!) 113/52  Pulse: 96  78 83  Resp: 18  19 18   Temp: 98.7 F (37.1 C)  97.8 F (36.6 C) 97.9 F (36.6 C)  TempSrc: Oral  Oral Oral  SpO2: 96% 90% 94% 97%  Weight:      Height:        Intake/Output Summary (Last 24 hours) at 04/12/2020 1112 Last data filed at 04/12/2020 0500 Gross per 24 hour  Intake 400 ml  Output 500 ml  Net -100 ml   Filed Weights   04/10/20 1600  Weight: 91.1 kg    Examination:  General exam: Appears calm and comfortable, somnolent Respiratory system: Clear to auscultation. Respiratory effort normal.  Currently on 2 L nasal cannula oxygen. Cardiovascular system: S1 & S2 heard, RRR.  Gastrointestinal system: Abdomen is soft Central nervous system: Somnolent, but arousable Extremities: Scant edema Skin: No significant lesions noted Psychiatry: Flat affect.    Data Reviewed: I have personally reviewed following labs and imaging studies  CBC: Recent Labs  Lab 04/10/20 1600 04/11/20 0430 04/12/20 0542  WBC 8.3 11.2* 8.4  NEUTROABS 5.2 9.0*  --   HGB 11.1* 9.5* 10.7*  HCT 31.2* 27.8* 32.3*  MCV 95.7 97.5 99.7  PLT 139* 109* 419*   Basic Metabolic Panel: Recent Labs  Lab 04/10/20 1600 04/11/20 0430 04/12/20 0542  NA 136 139 142  K 4.0 4.1 3.8  CL 103 105 106  CO2 25 28 27   GLUCOSE 138* 118* 78  BUN 42* 41* 35*  CREATININE 1.17 1.14 1.08  CALCIUM 8.8* 8.7* 9.0  MG  --  1.9 1.8   GFR: Estimated  Creatinine Clearance: 55.6 mL/min (by C-G formula based on SCr of 1.08 mg/dL). Liver Function Tests: Recent Labs  Lab 04/10/20 1600 04/11/20 0430 04/12/20 0542  AST 57* 45* 55*  ALT 27 24 26   ALKPHOS 164* 136* 139*  BILITOT 2.4* 2.4* 2.9*  PROT 6.1* 5.2* 6.0*  ALBUMIN 2.4* 2.0* 2.3*   No results for input(s): LIPASE, AMYLASE in the last 168 hours. Recent Labs  Lab 04/11/20 0430 04/11/20 0646 04/12/20 0542  AMMONIA 43* 25 27   Coagulation Profile: No results for input(s): INR, PROTIME in the last 168 hours. Cardiac Enzymes: No results for input(s): CKTOTAL, CKMB, CKMBINDEX, TROPONINI in the last 168 hours. BNP (last 3 results) No results for input(s): PROBNP in the last 8760 hours. HbA1C: No results for input(s): HGBA1C in the last 72 hours. CBG: No results for input(s): GLUCAP in the last 168 hours. Lipid Profile: No results for input(s): CHOL, HDL, LDLCALC, TRIG, CHOLHDL, LDLDIRECT in the last 72 hours. Thyroid Function Tests: Recent Labs    04/11/20 0430  TSH 2.191   Anemia Panel: No results for input(s): VITAMINB12, FOLATE, FERRITIN, TIBC, IRON, RETICCTPCT in the last 72 hours. Sepsis Labs: No results for input(s): PROCALCITON, LATICACIDVEN in the last 168 hours.  Recent Results (from the past 240 hour(s))  Resp Panel by  RT-PCR (Flu A&B, Covid) Nasopharyngeal Swab     Status: None   Collection Time: 04/10/20  4:21 PM   Specimen: Nasopharyngeal Swab; Nasopharyngeal(NP) swabs in vial transport medium  Result Value Ref Range Status   SARS Coronavirus 2 by RT PCR NEGATIVE NEGATIVE Final    Comment: (NOTE) SARS-CoV-2 target nucleic acids are NOT DETECTED.  The SARS-CoV-2 RNA is generally detectable in upper respiratory specimens during the acute phase of infection. The lowest concentration of SARS-CoV-2 viral copies this assay can detect is 138 copies/mL. A negative result does not preclude SARS-Cov-2 infection and should not be used as the sole basis for  treatment or other patient management decisions. A negative result may occur with  improper specimen collection/handling, submission of specimen other than nasopharyngeal swab, presence of viral mutation(s) within the areas targeted by this assay, and inadequate number of viral copies(<138 copies/mL). A negative result must be combined with clinical observations, patient history, and epidemiological information. The expected result is Negative.  Fact Sheet for Patients:  EntrepreneurPulse.com.au  Fact Sheet for Healthcare Providers:  IncredibleEmployment.be  This test is no t yet approved or cleared by the [place] FDA and  has been authorized for detection and/or diagnosis of SARS-CoV-2 by FDA under an Emergency Use Authorization (EUA). This EUA will remain  in effect (meaning this test can be used) for the duration of the COVID-19 declaration under Section 564(b)(1) of the Act, 21 U.S.C.section 360bbb-3(b)(1), unless the authorization is terminated  or revoked sooner.       Influenza A by PCR NEGATIVE NEGATIVE Final   Influenza B by PCR NEGATIVE NEGATIVE Final    Comment: (NOTE) The Xpert Xpress SARS-CoV-2/FLU/RSV plus assay is intended as an aid in the diagnosis of influenza from Nasopharyngeal swab specimens and should not be used as a sole basis for treatment. Nasal washings and aspirates are unacceptable for Xpert Xpress SARS-CoV-2/FLU/RSV testing.  Fact Sheet for Patients: EntrepreneurPulse.com.au  Fact Sheet for Healthcare Providers: IncredibleEmployment.be  This test is not yet approved or cleared by the [place] FDA and has been authorized for detection and/or diagnosis of SARS-CoV-2 by FDA under an Emergency Use Authorization (EUA). This EUA will remain in effect (meaning this test can be used) for the duration of the COVID-19 declaration under Section 564(b)(1) of the Act, 21  U.S.C. section 360bbb-3(b)(1), unless the authorization is terminated or revoked.  Performed at [facility], [address], [place], [place] [address]          Radiology Studies: CT HEAD WO CONTRAST  Result Date: 04/11/2020 CLINICAL DATA:  Altered mental status. EXAM: CT HEAD WITHOUT CONTRAST TECHNIQUE: Contiguous axial images were obtained from the base of the skull through the vertex without intravenous contrast. COMPARISON:  None. FINDINGS: Brain: Mild diffuse cortical atrophy is noted. Mild chronic ischemic white matter disease is noted. No mass effect or midline shift is noted. Ventricular size is within normal limits. There is no evidence of mass lesion, hemorrhage or acute infarction. Vascular: No hyperdense vessel or unexpected calcification. Skull: Normal. Negative for fracture or focal lesion. Sinuses/Orbits: No acute finding. Other: None. IMPRESSION: Mild diffuse cortical atrophy. Mild chronic ischemic white matter disease. No acute intracranial abnormality seen. Electronically Signed   By: Marijo Conception M.D.   On: 04/11/2020 11:17   DG Chest Port 1 View  Result Date: 04/11/2020 CLINICAL DATA:  87 year old male with shortness of breath. Cirrhosis. Negative for COVID-19 04/10/2020. EXAM: PORTABLE CHEST 1 VIEW COMPARISON:  Portable chest 04/10/2020 and  earlier. FINDINGS: Portable AP upright view at 0547 hours. Small bilateral pleural effusions with mildly decreased veiling opacity at both lung bases from yesterday. Mediastinal contours remain normal. Pulmonary vascularity also appears mildly decreased, no overt edema. No air bronchograms or pneumothorax identified. Stable visible bowel gas pattern, cholecystectomy clips. No acute osseous abnormality identified. IMPRESSION: 1. Small bilateral pleural effusions perhaps mildly decreased since yesterday. Lung base atelectasis. 2. No overt edema.  No new cardiopulmonary abnormality. Electronically Signed   By: Genevie Ann M.D.   On: 04/11/2020  06:32   DG Chest Port 1 View  Result Date: 04/10/2020 CLINICAL DATA:  Cough, aspiration.  Pending COVID-19 test. EXAM: PORTABLE CHEST 1 VIEW COMPARISON:  03/29/2020 FINDINGS: Patient is rotated to the right. Lungs are adequately inflated demonstrate persistent bibasilar opacification with worsening in the right base likely small effusions with associated bibasilar atelectasis. Infection over the lung bases is possible. Mild stable cardiomegaly. Remainder of the exam is unchanged. IMPRESSION: Persistent bibasilar opacification with interval worsening in the right base likely small effusions with associated bibasilar atelectasis. Infection over the lung bases is possible. Electronically Signed   By: Marin Olp M.D.   On: 04/10/2020 16:22        Scheduled Meds: . brimonidine  1 drop Both Eyes BID  . chlorhexidine  15 mL Mouth Rinse BID  . feeding supplement  237 mL Oral BID BM  . ketotifen  1 drop Both Eyes BID  . lactulose  10 g Oral Daily  . latanoprost  1 drop Both Eyes QHS  . levothyroxine  50 mcg Oral Daily  . mouth rinse  15 mL Mouth Rinse q12n4p   Continuous Infusions: . ampicillin-sulbactam (UNASYN) IV 3 g (04/12/20 0806)     LOS: 2 days    Time spent: 35 minutes    Leroy Trim D Manuella Ghazi, DO [facility]  If 7PM-7AM, please contact night-coverage [email or web address] 04/12/2020, 11:12 AM

## 2020-04-12 NOTE — Plan of Care (Signed)
Patient admitted to unit 300 via stretcher from ED. Vital signs stable. Patient alert and able to follow commands. No pain or questions as this time.

## 2020-04-13 ENCOUNTER — Inpatient Hospital Stay
Admission: RE | Admit: 2020-04-13 | Discharge: 2020-04-30 | Disposition: A | Discharge status: Home / Self Care | Payer: Medicare PPO | Source: Ambulatory Visit | Attending: Internal Medicine | Admitting: Internal Medicine

## 2020-04-13 DIAGNOSIS — E039 Hypothyroidism, unspecified: Secondary | ICD-10-CM

## 2020-04-13 DIAGNOSIS — E43 Unspecified severe protein-calorie malnutrition: Secondary | ICD-10-CM

## 2020-04-13 DIAGNOSIS — J690 Pneumonitis due to inhalation of food and vomit: Principal | ICD-10-CM

## 2020-04-13 LAB — COMPREHENSIVE METABOLIC PANEL
ALT: 25 U/L (ref 0–44)
AST: 53 U/L — ABNORMAL HIGH (ref 15–41)
Albumin: 2.1 g/dL — ABNORMAL LOW (ref 3.5–5.0)
Alkaline Phosphatase: 139 U/L — ABNORMAL HIGH (ref 38–126)
Anion gap: 8 (ref 5–15)
BUN: 30 mg/dL — ABNORMAL HIGH (ref 8–23)
CO2: 27 mmol/L (ref 22–32)
Calcium: 8.6 mg/dL — ABNORMAL LOW (ref 8.9–10.3)
Chloride: 104 mmol/L (ref 98–111)
Creatinine, Ser: 0.9 mg/dL (ref 0.61–1.24)
GFR, Estimated: >60 mL/min (ref >60)
Glucose, Bld: 95 mg/dL (ref 70–99)
Potassium: 3.6 mmol/L (ref 3.5–5.1)
Sodium: 139 mmol/L (ref 135–145)
Total Bilirubin: 2.1 mg/dL — ABNORMAL HIGH (ref 0.3–1.2)
Total Protein: 5.5 g/dL — ABNORMAL LOW (ref 6.5–8.1)

## 2020-04-13 LAB — CBC
HCT: 31.5 % — ABNORMAL LOW (ref 39.0–52.0)
Hemoglobin: 10.4 g/dL — ABNORMAL LOW (ref 13.0–17.0)
MCH: 33.2 pg (ref 26.0–34.0)
MCHC: 33 g/dL (ref 30.0–36.0)
MCV: 100.6 fL — ABNORMAL HIGH (ref 80.0–100.0)
Platelets: 110 10*3/uL — ABNORMAL LOW (ref 150–400)
RBC: 3.13 MIL/uL — ABNORMAL LOW (ref 4.22–5.81)
RDW: 15.8 % — ABNORMAL HIGH (ref 11.5–15.5)
WBC: 8.2 10*3/uL (ref 4.0–10.5)
nRBC: 0 % (ref 0.0–0.2)

## 2020-04-13 LAB — MAGNESIUM: Magnesium: 1.8 mg/dL (ref 1.7–2.4)

## 2020-04-13 LAB — SARS CORONAVIRUS 2 (TAT 6-24 HRS): SARS Coronavirus 2: NEGATIVE

## 2020-04-13 MED ORDER — AMOXICILLIN-POT CLAVULANATE 875-125 MG PO TABS: 1.0000 | ORAL_TABLET | Freq: Two times a day (BID) | ORAL | 0 refills | Status: AC

## 2020-04-13 MED ORDER — ENSURE ENLIVE PO LIQD: 237.0000 mL | Freq: Two times a day (BID) | ORAL | 12 refills | Status: AC

## 2020-04-13 NOTE — Progress Notes (Signed)
Report called to the [facility]. 

## 2020-04-13 NOTE — TOC Transition Note (Signed)
Transition of Care [facility]) - CM/SW Discharge Note   Patient Details  Name: Gabriel Spencer MRN: [id number] Date of Birth: 01/08/1932  [facility] CM/SW Contact:  Boneta Lucks, RN Phone Number: 04/13/2020, 10:35 AM   Clinical Narrative:   Patient medically ready to discharge back to [place] center today. TOC updated wife. Tammy with [facility] is ready. Insurance Josem Kaufmann is waived this month. They need a new covid test. RN updated and will call report after test results.     Final next level of care: Skilled Nursing Facility Barriers to Discharge: Barriers Resolved   Patient Goals and CMS Choice Patient states their goals for this hospitalization and ongoing recovery are:: to go back to [facility]> CMS Medicare.gov Compare Post Acute Care list provided to:: Patient Represenative (must comment) Choice offered to / list presented to : Spouse  Discharge Placement              Patient chooses bed at: [facility] Patient to be transferred to facility by: [facility] staff Name of family member notified: Abhi Moccia- wife Patient and family notified of of transfer: 04/13/20  Discharge Plan and Services    Readmission Risk Interventions Readmission Risk Prevention Plan 04/13/2020  Transportation Screening Complete  PCP or Specialist Appt within 5-7 Days Complete  Home Care Screening Complete  Medication Review (RN CM) Complete  Some recent data might be hidden

## 2020-04-13 NOTE — Discharge Summary (Signed)
Physician Discharge Summary  Gabriel Spencer [id number] DOB: 12/23/1931 DOA: 04/10/2020  PCP: Doree Albee, MD  Admit date: 04/10/2020 Discharge date: 04/13/2020  Disposition:   SNF   Recommendations for Outpatient Follow-up:  1. Follow up with PCP in 2 weeks 2. Please obtain BMP in 1 week  Discharge Condition: STABLE   CODE STATUS: FULL    Brief Hospitalization Summary: Please see all hospital notes, images, labs for full details of the hospitalization. ADMISSION HPI:  Gabriel Spencer  is a 87 y.o. male, with history of thyroid disease, macular degeneration, glaucoma, cirrhosis of liver, chronic diastolic heart failure, arthritis, and more presents to the ED for chief complaint of possible aspiration. Patient presents from the [facility]  It is reported that they noticed patient having episode of emesis, and then patient was progressively short of breath throughout the day after that.  Patient was noted to have had a fever at the [facility] as well.  Patient was transported to the ER for further evaluation.  ED provider also spoke with a relative who reports that at baseline patient is quite chatty and interactive.  He has limitations to his physical abilities given his macular degeneration, but is usually quite clear in the mind per this report.  Today patient is requiring 3 people to help him sit up in bed, and he is only grunting when asked questions during the interview.  It sounds as if he is trying to say yes and no, but what he is actually saying is unclear.  He will not state his name.  Of note patient does have a history of liver cirrhosis and has a as needed lactulose prescription for ammonia over 50.  Last ammonia was January 11 and was 50.  It is unclear if patient received lactulose since that lab draw.  Patient also has a history of thyroid disease.  His last TSH was elevated.  He is on Synthroid.  Patient was recently admitted March 29, 2020 for generalized weakness.  He  was found to be in acute on chronic diastolic CHF.  He was discharged with a new medication-Lasix 20 mg p.o. daily, and sent to the [facility] for short-term skilled nursing.  Patient diuresed over 2-1/2 L while in the hospital, and dry weight was 6 pounds less than admission weight.  He was not started on ACE inhibitor because of soft blood pressures.  He did have an echocardiogram done at that admission with preserved ejection fraction and indeterminate diastolic parameters.  In the ED Temperature 98.4, heart rate 92, respiratory rate 20, blood pressure 123/53, satting at 93%, 4 L nasal cannula Hematology revealed no leukocytosis with a white blood cell count of 8.3, hemoglobin 11.1 Chemistry reveals a creatinine that is near to baseline at 1.17 baseline appears to be about 1.0 Troponin is not elevated at 17 Chest x-ray shows bibasilar opacification with interval worsening of right base likely small effusions with atelectasis infection also possible Zofran was given in the ED Admission requested for further work-up of hypoxia and altered mental status  HOSPITAL COURSE  Brief Narrative:  Gabriel Spencer y.o.male,with history of thyroid disease, macular degeneration, glaucoma, cirrhosis of liver, chronic diastolic heart failure, arthritis, and more presents to the ED for chief complaint of possible aspiration. Patient presents from the [facility]. It is reported that they noticed patient having episode of emesis, and then patient was progressively short of breath throughout the day after that.Patient was admitted with acute multifactorial encephalopathy in the  setting of hypoxemia as well as his metabolic causes with suspected aspiration pneumonia. He has been started on Unasyn for treatment empirically.  He continues to remain encephalopathic and diet has not been advanced.  SLP evaluation pending.  Also now noted to have hematuria.  Assessment & Plan:   Principal Problem:   Acute  respiratory failure with hypoxia (HCC) Active Problems:   Hypothyroidism   Protein-calorie malnutrition, severe (HCC)   Encephalopathy   Aspiration pneumonia (HCC)  Acute encephalopathy-multifactorial; RESOLVED -Appears to be related to mostly hypoxemia/aspiration -Did relatively well with bedside swallow evaluation, tolerating Dys 3 diet  -Seems to be back at baseline  Acute hypoxemic respiratory failure secondary to aspiration pneumonia -Wean oxygen as tolerated-improving and will wean further to room air as tolerated today -Treated with Unasyn. DC on 2 more days of oral augmentin.   Severe protein calorie malnutrition -Continue feeding supplementwhenpatient is able to tolerate p.o. -Ensure supplementation  Anemia -No overt bleeding noted -Monitor CBC which has remained stable -Anemia panel stable  Thrombocytopenia -Avoid heparin agents -Likely related to liver cirrhosis, stable  Liver cirrhosis with elevated T bili -Continue monitoring -Currently stable  Chronic diastolic congestive heart failure -Does not appear to be significantly volume overloaded on exam -Chest x-ray, however with pleural effusions and BNP with mild elevation -Holding torsemide for now due to poor oral intake. Follow up with PCP in 1 week to assess if it needs to be restarted.   Hypothyroidism -Continue Synthroid -TSH 2.19   DVT prophylaxis:SCDs Code Status:Full Family Communication:Discussed with granddaughter on phone 1/18 Disposition Plan: Status is: Inpatient  Remains inpatient appropriate because:Altered mental status, IV treatments appropriate due to intensity of illness or inability to take PO and Inpatient level of care appropriate due to severity of illness  Discharge Diagnoses:  Principal Problem:   Acute respiratory failure with hypoxia ([place]) Active Problems:   Hypothyroidism   Protein-calorie malnutrition, severe ([place])   Encephalopathy   Aspiration pneumonia  [facility])   Discharge Instructions:  Allergies as of 04/13/2020      Reactions   Cephalexin Hives, Other (See Comments)      Medication List    STOP taking these medications   torsemide 20 MG tablet Commonly known as: DEMADEX     TAKE these medications   albuterol 108 (90 Base) MCG/ACT inhaler Commonly known as: VENTOLIN HFA Inhale 2 puffs into the lungs every 4 (four) hours as needed for wheezing or shortness of breath.   allopurinol 100 MG tablet Commonly known as: ZYLOPRIM Take 100 mg by mouth daily.   amoxicillin-clavulanate 875-125 MG tablet Commonly known as: Augmentin Take 1 tablet by mouth 2 (two) times daily for 2 days.   brimonidine 0.15 % ophthalmic solution Commonly known as: ALPHAGAN Place 1 drop into both eyes 2 (two) times daily.   cholecalciferol 10 MCG (400 UNIT) Tabs tablet Commonly known as: VITAMIN D3 Take 400 Units by mouth daily.   feeding supplement (PRO-STAT SUGAR FREE 64) Liqd Take 30 mLs by mouth 3 (three) times daily with meals. for albumin 2.3   feeding supplement Liqd Take 237 mLs by mouth 2 (two) times daily between meals. Start taking on: April 14, 2020   ketotifen 0.025 % ophthalmic solution Commonly known as: ZADITOR Place 1 drop into both eyes 2 (two) times daily.   lactulose 10 GM/15ML solution Commonly known as: CHRONULAC Take 10 g by mouth daily. for ammonia level of 50   latanoprost 0.005 % ophthalmic solution Commonly known as: Tax inspector  1 drop into both eyes at bedtime.   levothyroxine 50 MCG tablet Commonly known as: SYNTHROID Take 50 mcg by mouth daily.   meclizine 12.5 MG tablet Commonly known as: ANTIVERT Take 12.5 mg by mouth 2 (two) times daily as needed for dizziness.   NON FORMULARY Diet: _____ Regular, ___x___ NAS, _______Consistent Carbohydrate, _______NPO _____Other   PRESERVISION AREDS PO Take 1 tablet by mouth 2 (two) times daily.   Venelex Oint Apply topically in the morning, at noon,  and at bedtime. Special Instructions: Apply to sacrum, coccyx and bilateral buttocks qshift for prevention.       Follow-up Information    Doree Albee, MD. Schedule an appointment as soon as possible for a visit in 2 week(s).   Specialty: Internal Medicine Contact information: [address] [address] [phone]              Allergies  Allergen Reactions  . Cephalexin Hives and Other (See Comments)   Allergies as of 04/13/2020      Reactions   Cephalexin Hives, Other (See Comments)      Medication List    STOP taking these medications   torsemide 20 MG tablet Commonly known as: DEMADEX     TAKE these medications   albuterol 108 (90 Base) MCG/ACT inhaler Commonly known as: VENTOLIN HFA Inhale 2 puffs into the lungs every 4 (four) hours as needed for wheezing or shortness of breath.   allopurinol 100 MG tablet Commonly known as: ZYLOPRIM Take 100 mg by mouth daily.   amoxicillin-clavulanate 875-125 MG tablet Commonly known as: Augmentin Take 1 tablet by mouth 2 (two) times daily for 2 days.   brimonidine 0.15 % ophthalmic solution Commonly known as: ALPHAGAN Place 1 drop into both eyes 2 (two) times daily.   cholecalciferol 10 MCG (400 UNIT) Tabs tablet Commonly known as: VITAMIN D3 Take 400 Units by mouth daily.   feeding supplement (PRO-STAT SUGAR FREE 64) Liqd Take 30 mLs by mouth 3 (three) times daily with meals. for albumin 2.3   feeding supplement Liqd Take 237 mLs by mouth 2 (two) times daily between meals. Start taking on: April 14, 2020   ketotifen 0.025 % ophthalmic solution Commonly known as: ZADITOR Place 1 drop into both eyes 2 (two) times daily.   lactulose 10 GM/15ML solution Commonly known as: CHRONULAC Take 10 g by mouth daily. for ammonia level of 50   latanoprost 0.005 % ophthalmic solution Commonly known as: XALATAN Place 1 drop into both eyes at bedtime.   levothyroxine 50 MCG tablet Commonly  known as: SYNTHROID Take 50 mcg by mouth daily.   meclizine 12.5 MG tablet Commonly known as: ANTIVERT Take 12.5 mg by mouth 2 (two) times daily as needed for dizziness.   NON FORMULARY Diet: _____ Regular, ___x___ NAS, _______Consistent Carbohydrate, _______NPO _____Other   PRESERVISION AREDS PO Take 1 tablet by mouth 2 (two) times daily.   Venelex Oint Apply topically in the morning, at noon, and at bedtime. Special Instructions: Apply to sacrum, coccyx and bilateral buttocks qshift for prevention.       Procedures/Studies: CT HEAD WO CONTRAST  Result Date: 04/11/2020 CLINICAL DATA:  Altered mental status. EXAM: CT HEAD WITHOUT CONTRAST TECHNIQUE: Contiguous axial images were obtained from the base of the skull through the vertex without intravenous contrast. COMPARISON:  None. FINDINGS: Brain: Mild diffuse cortical atrophy is noted. Mild chronic ischemic white matter disease is noted. No mass effect or midline shift is noted.  Ventricular size is within normal limits. There is no evidence of mass lesion, hemorrhage or acute infarction. Vascular: No hyperdense vessel or unexpected calcification. Skull: Normal. Negative for fracture or focal lesion. Sinuses/Orbits: No acute finding. Other: None. IMPRESSION: Mild diffuse cortical atrophy. Mild chronic ischemic white matter disease. No acute intracranial abnormality seen. Electronically Signed   By: Marijo Conception M.D.   On: 04/11/2020 11:17   DG Chest Port 1 View  Result Date: 04/11/2020 CLINICAL DATA:  87 year old male with shortness of breath. Cirrhosis. Negative for COVID-19 04/10/2020. EXAM: PORTABLE CHEST 1 VIEW COMPARISON:  Portable chest 04/10/2020 and earlier. FINDINGS: Portable AP upright view at 0547 hours. Small bilateral pleural effusions with mildly decreased veiling opacity at both lung bases from yesterday. Mediastinal contours remain normal. Pulmonary vascularity also appears mildly decreased, no overt edema. No air  bronchograms or pneumothorax identified. Stable visible bowel gas pattern, cholecystectomy clips. No acute osseous abnormality identified. IMPRESSION: 1. Small bilateral pleural effusions perhaps mildly decreased since yesterday. Lung base atelectasis. 2. No overt edema.  No new cardiopulmonary abnormality. Electronically Signed   By: Genevie Ann M.D.   On: 04/11/2020 06:32   DG Chest Port 1 View  Result Date: 04/10/2020 CLINICAL DATA:  Cough, aspiration.  Pending COVID-19 test. EXAM: PORTABLE CHEST 1 VIEW COMPARISON:  03/29/2020 FINDINGS: Patient is rotated to the right. Lungs are adequately inflated demonstrate persistent bibasilar opacification with worsening in the right base likely small effusions with associated bibasilar atelectasis. Infection over the lung bases is possible. Mild stable cardiomegaly. Remainder of the exam is unchanged. IMPRESSION: Persistent bibasilar opacification with interval worsening in the right base likely small effusions with associated bibasilar atelectasis. Infection over the lung bases is possible. Electronically Signed   By: Marin Olp M.D.   On: 04/10/2020 16:22   DG Chest Port 1 View  Result Date: 03/29/2020 CLINICAL DATA:  Cough. EXAM: PORTABLE CHEST 1 VIEW COMPARISON:  Chest x-ray 01/05/2017. FINDINGS: Cardiomegaly. Diffuse bilateral pulmonary infiltrates/edema bilateral pleural effusions. Findings most consistent CHF. Bilateral pneumonia cannot be excluded. No pneumothorax. IMPRESSION: Cardiomegaly with diffuse bilateral pulmonary infiltrates/edema and bilateral pleural effusions. Findings most consistent CHF. Bilateral pneumonia cannot be excluded. Electronically Signed   By: Marcello Moores  Register   On: 03/29/2020 12:32   ECHOCARDIOGRAM COMPLETE  Result Date: 03/30/2020    ECHOCARDIOGRAM REPORT   Patient Name:   DAESEAN SCHLEY Date of Exam: 03/30/2020 Medical Rec #:  [id number]    Height:       71.0 in Accession #:    [id number]   Weight:       206.6 lb Date of Birth:   Jul 25, 1931    BSA:          2.138 m Patient Age:    10 years     BP:           118/68 mmHg Patient Gender: M            HR:           73 bpm. Exam Location:  [facility] Procedure: 2D Echo, Cardiac Doppler and Color Doppler Indications:    Acute CHF (congestive heart failure) ([place]) [id number]  History:        Patient has prior history of Echocardiogram examinations, most                 recent 01/11/2019. CHF. AKI (acute kidney injury, Bladder  cancer.  Sonographer:    Alvino Chapel RCS Referring Phys: [id number]  1. Left ventricular ejection fraction, by estimation, is 70 to 75%. The left ventricle has hyperdynamic function. The left ventricle has no regional wall motion abnormalities. There is mild left ventricular hypertrophy. Left ventricular diastolic parameters are indeterminate.  2. Right ventricular systolic function is normal. The right ventricular size is normal. There is mildly elevated pulmonary artery systolic pressure. The estimated right ventricular systolic pressure is 37.6 mmHg.  3. Left atrial size was mildly dilated.  4. The mitral valve is grossly normal. Trivial mitral valve regurgitation. Moderate to severe mitral annular calcification.  5. The aortic valve is tricuspid. There is mild calcification of the aortic valve. Aortic valve regurgitation is not visualized.  6. The inferior vena cava is dilated in size with >50% respiratory variability, suggesting right atrial pressure of 8 mmHg. FINDINGS  Left Ventricle: Left ventricular ejection fraction, by estimation, is 70 to 75%. The left ventricle has hyperdynamic function. The left ventricle has no regional wall motion abnormalities. The left ventricular internal cavity size was normal in size. There is mild left ventricular hypertrophy. Left ventricular diastolic parameters are indeterminate. Right Ventricle: The right ventricular size is normal. No increase in right ventricular wall thickness. Right ventricular  systolic function is normal. There is mildly elevated pulmonary artery systolic pressure. The tricuspid regurgitant velocity is 2.88  m/s, and with an assumed right atrial pressure of 8 mmHg, the estimated right ventricular systolic pressure is 28.3 mmHg. Left Atrium: Left atrial size was mildly dilated. Right Atrium: Right atrial size was normal in size. Pericardium: There is no evidence of pericardial effusion. Mitral Valve: The mitral valve is grossly normal. Moderate to severe mitral annular calcification. Trivial mitral valve regurgitation. Tricuspid Valve: The tricuspid valve is grossly normal. Tricuspid valve regurgitation is mild. Aortic Valve: The aortic valve is tricuspid. There is mild calcification of the aortic valve. There is mild aortic valve annular calcification. Aortic valve regurgitation is not visualized. Pulmonic Valve: The pulmonic valve was grossly normal. Pulmonic valve regurgitation is trivial. Aorta: The aortic root is normal in size and structure. Venous: The inferior vena cava is dilated in size with greater than 50% respiratory variability, suggesting right atrial pressure of 8 mmHg. IAS/Shunts: No atrial level shunt detected by color flow Doppler.  LEFT VENTRICLE PLAX 2D LVIDd:         4.50 cm  Diastology LVIDs:         2.80 cm  LV e' medial:    7.40 cm/s LV PW:         1.10 cm  LV E/e' medial:  11.6 LV IVS:        0.90 cm  LV e' lateral:   8.92 cm/s LVOT diam:     2.00 cm  LV E/e' lateral: 9.6 LV SV:         100 LV SV Index:   47 LVOT Area:     3.14 cm  RIGHT VENTRICLE RV S prime:     13.10 cm/s TAPSE (M-mode): 2.9 cm LEFT ATRIUM             Index       RIGHT ATRIUM           Index LA diam:        4.70 cm 2.20 cm/m  RA Area:     19.10 cm LA Vol (A2C):   73.6 ml 34.43 ml/m RA Volume:   52.80 ml  24.70 ml/m LA Vol (A4C):   70.6 ml 33.03 ml/m LA Biplane Vol: 76.3 ml 35.69 ml/m  AORTIC VALVE LVOT Vmax:   123.00 cm/s LVOT Vmean:  83.550 cm/s LVOT VTI:    0.318 m  AORTA Ao Root diam:  3.30 cm MITRAL VALVE                TRICUSPID VALVE MV Area (PHT): 2.08 cm     TR Peak grad:   33.2 mmHg MV Decel Time: 366 msec     TR Vmax:        288.00 cm/s MV E velocity: 85.60 cm/s MV A velocity: 103.60 cm/s  SHUNTS MV E/A ratio:  0.83         Systemic VTI:  0.32 m                             Systemic Diam: 2.00 cm Rozann Lesches MD Electronically signed by Rozann Lesches MD Signature Date/Time: 03/30/2020/11:01:53 AM    Final    US Abdomen Limited RUQ (LIVER/GB)  Result Date: 03/29/2020 CLINICAL DATA:  Elevated liver function tests. Status post prior cholecystectomy. EXAM: ULTRASOUND ABDOMEN LIMITED RIGHT UPPER QUADRANT COMPARISON:  11/28/2011 FINDINGS: Gallbladder: Surgically absent Common bile duct: Diameter: 4 mm. Liver: The liver is shrunken, nodular and grossly cirrhotic. Parenchyma is very heterogeneous. No gross lesion or intrahepatic biliary ductal dilatation. The portal vein is not very well visualized. Color Doppler flow is demonstrated in the main portal vein with limited evaluation of the intrahepatic portal veins. Other: Moderate ascites is present surrounding the liver in the right upper quadrant. IMPRESSION: 1. The liver demonstrates evidence of advanced cirrhosis by ultrasound. 2. Moderate ascites surrounding the liver in the right upper quadrant. Electronically Signed   By: Aletta Edouard M.D.   On: 03/29/2020 17:01      Subjective: Pt reports feeling much better, no specific complaints.  Agreeable to SNF placement.   Discharge Exam: Vitals:   04/12/20 2125 04/13/20 1256  BP: 138/66 129/68  Pulse: 96 95  Resp: 18 17  Temp: 98.2 F (36.8 C) 98.5 F (36.9 C)  SpO2: 90% 94%   Vitals:   04/12/20 0425 04/12/20 1327 04/12/20 2125 04/13/20 1256  BP: (!) 113/52 (!) 115/52 138/66 129/68  Pulse: 83 89 96 95  Resp: 18 17 18 17   Temp: 97.9 F (36.6 C)  98.2 F (36.8 C) 98.5 F (36.9 C)  TempSrc: Oral  Oral Oral  SpO2: 97% 95% 90% 94%  Weight:      Height:         General: Pt is alert, awake, not in acute distress Cardiovascular: RRR, S1/S2 +, no rubs, no gallops Respiratory: CTA bilaterally, no wheezing, no rhonchi Abdominal: Soft, NT, ND, bowel sounds + Extremities: no edema, no cyanosis   The results of significant diagnostics from this hospitalization (including imaging, microbiology, ancillary and laboratory) are listed below for reference.     Microbiology: Recent Results (from the past 240 hour(s))  Resp Panel by RT-PCR (Flu A&B, Covid) Nasopharyngeal Swab     Status: None   Collection Time: 04/10/20  4:21 PM   Specimen: Nasopharyngeal Swab; Nasopharyngeal(NP) swabs in vial transport medium  Result Value Ref Range Status   SARS Coronavirus 2 by RT PCR NEGATIVE NEGATIVE Final    Comment: (NOTE) SARS-CoV-2 target nucleic acids are NOT DETECTED.  The SARS-CoV-2 RNA is generally detectable in upper respiratory specimens during the acute phase  of infection. The lowest concentration of SARS-CoV-2 viral copies this assay can detect is 138 copies/mL. A negative result does not preclude SARS-Cov-2 infection and should not be used as the sole basis for treatment or other patient management decisions. A negative result may occur with  improper specimen collection/handling, submission of specimen other than nasopharyngeal swab, presence of viral mutation(s) within the areas targeted by this assay, and inadequate number of viral copies(<138 copies/mL). A negative result must be combined with clinical observations, patient history, and epidemiological information. The expected result is Negative.  Fact Sheet for Patients:  EntrepreneurPulse.com.au  Fact Sheet for Healthcare Providers:  IncredibleEmployment.be  This test is no t yet approved or cleared by the [place] FDA and  has been authorized for detection and/or diagnosis of SARS-CoV-2 by FDA under an Emergency Use Authorization (EUA). This EUA  will remain  in effect (meaning this test can be used) for the duration of the COVID-19 declaration under Section 564(b)(1) of the Act, 21 U.S.C.section 360bbb-3(b)(1), unless the authorization is terminated  or revoked sooner.       Influenza A by PCR NEGATIVE NEGATIVE Final   Influenza B by PCR NEGATIVE NEGATIVE Final    Comment: (NOTE) The Xpert Xpress SARS-CoV-2/FLU/RSV plus assay is intended as an aid in the diagnosis of influenza from Nasopharyngeal swab specimens and should not be used as a sole basis for treatment. Nasal washings and aspirates are unacceptable for Xpert Xpress SARS-CoV-2/FLU/RSV testing.  Fact Sheet for Patients: EntrepreneurPulse.com.au  Fact Sheet for Healthcare Providers: IncredibleEmployment.be  This test is not yet approved or cleared by the [place] FDA and has been authorized for detection and/or diagnosis of SARS-CoV-2 by FDA under an Emergency Use Authorization (EUA). This EUA will remain in effect (meaning this test can be used) for the duration of the COVID-19 declaration under Section 564(b)(1) of the Act, 21 U.S.C. section 360bbb-3(b)(1), unless the authorization is terminated or revoked.  Performed at [facility], [address], [place], [place] [address]   Urine Culture     Status: Abnormal   Collection Time: 04/10/20  5:01 PM   Specimen: Urine, Clean Catch  Result Value Ref Range Status   Specimen Description   Final    URINE, CLEAN CATCH Performed at [facility], [address], [place], [place] [address]    Special Requests   Final    NONE Performed at [facility], [address], [place], [place] [address]    Culture (A)  Final    <10,000 COLONIES/mL INSIGNIFICANT GROWTH Performed at [facility], [place] [address] [place], [place] [address]    Report Status 04/12/2020 FINAL  Final     Labs: BNP (last 3 results) Recent Labs    03/29/20 1151 04/10/20 1820  BNP 129.0*  [id number]   Basic Metabolic Panel: Recent Labs  Lab 04/10/20 1600 04/11/20 0430 04/12/20 0542 04/13/20 0333  NA 136 139 142 139  K 4.0 4.1 3.8 3.6  CL 103 105 106 104  CO2 25 28 27 27   GLUCOSE 138* 118* 78 95  BUN 42* 41* 35* 30*  CREATININE 1.17 1.14 1.08 0.90  CALCIUM 8.8* 8.7* 9.0 8.6*  MG  --  1.9 1.8 1.8   Liver Function Tests: Recent Labs  Lab 04/10/20 1600 04/11/20 0430 04/12/20 0542 04/13/20 0333  AST 57* 45* 55* 53*  ALT 27 24 26 25   ALKPHOS 164* 136* 139* 139*  BILITOT 2.4* 2.4* 2.9* 2.1*  PROT 6.1* 5.2* 6.0* 5.5*  ALBUMIN 2.4* 2.0* 2.3* 2.1*   No results for input(s): LIPASE, AMYLASE in the last 168 hours. Recent Labs  Lab 04/11/20 0430 04/11/20 0646 04/12/20 0542  AMMONIA 43* 25 27   CBC: Recent Labs  Lab 04/10/20 1600 04/11/20 0430 04/12/20 0542 04/13/20 0333  WBC 8.3 11.2* 8.4 8.2  NEUTROABS 5.2 9.0*  --   --   HGB 11.1* 9.5* 10.7* 10.4*  HCT 31.2* 27.8* 32.3* 31.5*  MCV 95.7 97.5 99.7 100.6*  PLT 139* 109* 123* 110*   Cardiac Enzymes: No results for input(s): CKTOTAL, CKMB, CKMBINDEX, TROPONINI in the last 168 hours. BNP: Invalid input(s): POCBNP CBG: No results for input(s): GLUCAP in the last 168 hours. D-Dimer No results for input(s): DDIMER in the last 72 hours. Hgb A1c No results for input(s): HGBA1C in the last 72 hours. Lipid Profile No results for input(s): CHOL, HDL, LDLCALC, TRIG, CHOLHDL, LDLDIRECT in the last 72 hours. Thyroid function studies Recent Labs    04/11/20 0430  TSH 2.191   Anemia work up Recent Labs    04/12/20 0542  VITAMINB12 596  FOLATE 20.6  FERRITIN 257  TIBC 151*  IRON 51  RETICCTPCT 2.0   Urinalysis    Component Value Date/Time   COLORURINE AMBER (A) 04/12/2020 0645   APPEARANCEUR HAZY (A) 04/12/2020 0645   LABSPEC 1.023 04/12/2020 0645   PHURINE 6.0 04/12/2020 0645   GLUCOSEU NEGATIVE 04/12/2020 0645   HGBUR LARGE (A) 04/12/2020 0645   BILIRUBINUR NEGATIVE 04/12/2020 0645    KETONESUR NEGATIVE 04/12/2020 0645   PROTEINUR 100 (A) 04/12/2020 0645   UROBILINOGEN 1.0 01/28/2015 0925   NITRITE NEGATIVE 04/12/2020 0645   LEUKOCYTESUR TRACE (A) 04/12/2020 0645   Sepsis Labs Invalid input(s): PROCALCITONIN,  WBC,  LACTICIDVEN Microbiology Recent Results (from the past 240 hour(s))  Resp Panel by RT-PCR (Flu A&B, Covid) Nasopharyngeal Swab     Status: None   Collection Time: 04/10/20  4:21 PM   Specimen: Nasopharyngeal Swab; Nasopharyngeal(NP) swabs in vial transport medium  Result Value Ref Range Status   SARS Coronavirus 2 by RT PCR NEGATIVE NEGATIVE Final    Comment: (NOTE) SARS-CoV-2 target nucleic acids are NOT DETECTED.  The SARS-CoV-2 RNA is generally detectable in upper respiratory specimens during the acute phase of infection. The lowest concentration of SARS-CoV-2 viral copies this assay can detect is 138 copies/mL. A negative result does not preclude SARS-Cov-2 infection and should not be used as the sole basis for treatment or other patient management decisions. A negative result may occur with  improper specimen collection/handling, submission of specimen other than nasopharyngeal swab, presence of viral mutation(s) within the areas targeted by this assay, and inadequate number of viral copies(<138 copies/mL). A negative result must be combined with clinical observations, patient history, and epidemiological information. The expected result is Negative.  Fact Sheet for Patients:  EntrepreneurPulse.com.au  Fact Sheet for Healthcare Providers:  IncredibleEmployment.be  This test is no t yet approved or cleared by the [place] FDA and  has been authorized for detection and/or diagnosis of SARS-CoV-2 by FDA under an Emergency Use Authorization (EUA). This EUA will remain  in effect (meaning this test can be used) for the duration of the COVID-19 declaration under Section 564(b)(1) of the Act,  21 U.S.C.section 360bbb-3(b)(1), unless the authorization is terminated  or revoked sooner.       Influenza A by PCR NEGATIVE NEGATIVE Final   Influenza B by PCR NEGATIVE NEGATIVE Final    Comment: (NOTE) The Xpert Xpress  SARS-CoV-2/FLU/RSV plus assay is intended as an aid in the diagnosis of influenza from Nasopharyngeal swab specimens and should not be used as a sole basis for treatment. Nasal washings and aspirates are unacceptable for Xpert Xpress SARS-CoV-2/FLU/RSV testing.  Fact Sheet for Patients: EntrepreneurPulse.com.au  Fact Sheet for Healthcare Providers: IncredibleEmployment.be  This test is not yet approved or cleared by the [place] FDA and has been authorized for detection and/or diagnosis of SARS-CoV-2 by FDA under an Emergency Use Authorization (EUA). This EUA will remain in effect (meaning this test can be used) for the duration of the COVID-19 declaration under Section 564(b)(1) of the Act, 21 U.S.C. section 360bbb-3(b)(1), unless the authorization is terminated or revoked.  Performed at [facility], [address], [place], [place] [address]   Urine Culture     Status: Abnormal   Collection Time: 04/10/20  5:01 PM   Specimen: Urine, Clean Catch  Result Value Ref Range Status   Specimen Description   Final    URINE, CLEAN CATCH Performed at [facility], [address], [place], [place] [address]    Special Requests   Final    NONE Performed at [facility], [address] [place], [place] [address]    Culture (A)  Final    <10,000 COLONIES/mL INSIGNIFICANT GROWTH Performed at [facility], [place] [address], [place], [place] [address]    Report Status 04/12/2020 FINAL  Final    Time coordinating discharge: 41 mins   SIGNED:  Irwin Brakeman, MD  [facility] 04/13/2020, 1:37 PM How to contact the [facility] Attending or Consulting provider [place] or covering provider during after hours [place], for  this patient?  1. Check the care team in [facility] and look for a) attending/consulting [facility] provider listed and b) the [facility] team listed 2. Log into [email or web address] and use [place]'s universal password to access. If you do not have the password, please contact the hospital operator. 3. Locate the [facility] provider you are looking for under [facility] and page to a number that you can be directly reached. 4. If you still have difficulty reaching the provider, please page the [facility] (Director on Call) for the Hospitalists listed on amion for assistance.

## 2020-04-13 NOTE — Progress Notes (Signed)
  Speech Language Pathology Treatment: Dysphagia  Patient Details Name: Gabriel [place] MRN: [id number] DOB: [birth year] Today's Date: 04/13/2020 Time: 7096-2836 SLP Time Calculation (min) (ACUTE ONLY): 14 min  Assessment / Plan / Recommendation Clinical Impression  Pt seen at bedside for ongoing dysphagia intervention with his lunch meal. Pt reported that he was able to feed himself and eat his lunch well (although some spaghetti on his chest). Pt self feed canned pears and water via straw without overt signs or symptoms of aspiration, however occasional audible swallow. Pt is going back to [facility] later today. SLP will sign off.    HPI HPI: Gabriel [place]  is a 87 y.o. male, with history of thyroid disease, macular degeneration, glaucoma, cirrhosis of liver, chronic diastolic heart failure, arthritis, and more presents to the ED for chief complaint of possible aspiration.  Patient presents from the [facility].  It is reported that they noticed patient having episode of emesis, and then patient was progressively short of breath throughout the day after that.  Patient was admitted with acute multifactorial encephalopathy in the setting of hypoxemia as well as his metabolic causes with suspected aspiration pneumonia.  He has been started on Unasyn for treatment empirically.  He continues to remain encephalopathic and diet has not been advanced.  SLP evaluation pending.  Also now noted to have hematuria. BSE requested. Pt is on full liquids at this time.      SLP Plan  Continue with current plan of care       Recommendations  Diet recommendations: Dysphagia 3 (mechanical soft);Thin liquid Liquids provided via: Cup;Straw Medication Administration: Whole meds with liquid Supervision: Patient able to self feed;Intermittent supervision to cue for compensatory strategies Compensations: Slow rate;Small sips/bites Postural Changes and/or Swallow Maneuvers: Seated upright 90 degrees;Upright 30-60 min after meal                 Oral Care Recommendations: Oral care BID;Staff/trained caregiver to provide oral care Follow up Recommendations: 24 hour supervision/assistance SLP Visit Diagnosis: Dysphagia, unspecified (R13.10) Plan: Continue with current plan of care       Thank you,  Genene Churn, [place]                 [place] 04/13/2020, 1:44 PM

## 2020-04-14 ENCOUNTER — Encounter: Payer: Self-pay | Admitting: Adult Health

## 2020-04-14 ENCOUNTER — Non-Acute Institutional Stay (INDEPENDENT_AMBULATORY_CARE_PROVIDER_SITE_OTHER): Payer: Self-pay | Admitting: Adult Health

## 2020-04-14 DIAGNOSIS — R188 Other ascites: Secondary | ICD-10-CM

## 2020-04-14 DIAGNOSIS — K7460 Unspecified cirrhosis of liver: Secondary | ICD-10-CM

## 2020-04-14 DIAGNOSIS — E039 Hypothyroidism, unspecified: Secondary | ICD-10-CM

## 2020-04-14 DIAGNOSIS — J9601 Acute respiratory failure with hypoxia: Secondary | ICD-10-CM

## 2020-04-14 DIAGNOSIS — M1A09X0 Idiopathic chronic gout, multiple sites, without tophus (tophi): Secondary | ICD-10-CM

## 2020-04-14 DIAGNOSIS — I5033 Acute on chronic diastolic (congestive) heart failure: Secondary | ICD-10-CM

## 2020-04-14 DIAGNOSIS — D649 Anemia, unspecified: Secondary | ICD-10-CM

## 2020-04-14 DIAGNOSIS — H8110 Benign paroxysmal vertigo, unspecified ear: Secondary | ICD-10-CM

## 2020-04-14 DIAGNOSIS — D696 Thrombocytopenia, unspecified: Secondary | ICD-10-CM

## 2020-04-14 NOTE — Progress Notes (Signed)
Location:  [facility] Room Number: 153/P Place of Service:  SNF (31)   CODE STATUS: Full Code  Allergies  Allergen Reactions  . Cephalexin Hives and Other (See Comments)    Chief Complaint  Patient presents with  . Hospitalization Follow-up    Hospitalization Follow Up     HPI:  He is a 87 year old patient of this facility who has been hospitalized from 04-10-20 through 04-13-20. He sent to the ED due to respiratory distress. He was treated for aspiration pneumonia he has 2 more days of augmentin to complete his course. He denies any shortness of breat. He has a cough yet. There are no reports of fevers present. He denies any pain. 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 Acute lower UTI 01/10/2019  . Arthritis   . Chronic diastolic (congestive) heart failure ([place])   . Cirrhosis of liver ([place])   . Glaucoma   . Gout   . Macular degeneration   . Thyroid disease   . Vitamin D deficiency     Past Surgical History:  Procedure Laterality Date  . BLADDER SURGERY    . CATARACT EXTRACTION, BILATERAL    . CHOLECYSTECTOMY  11/30/2011   Procedure: LAPAROSCOPIC CHOLECYSTECTOMY;  Surgeon: Jamesetta So, MD;  Location: [facility];  Service: General;  Laterality: N/A;  . CYSTOSCOPY/RETROGRADE/URETEROSCOPY Bilateral 03/10/2015   Procedure: Consuela Mimes BILATERAL Concha Se;  Surgeon: Irine Seal, MD;  Location: [facility];  Service: Urology;  Laterality: Bilateral;  . LYMPH NODE BIOPSY  1975   right  . RETINAL DETACHMENT SURGERY     right eye  . TRANSURETHRAL RESECTION OF BLADDER TUMOR Bilateral 03/10/2015   Procedure: TRANSURETHRAL RESECTION OF BLADDER TUMOR (TURBT) INSTILLATION WITH MITOMYCIN C IN RECOVERY;  Surgeon: Irine Seal, MD;  Location: [facility];   Service: Urology;  Laterality: Bilateral;  . TYMPANOPLASTY     right    Social History   Socioeconomic History  . Marital status: Married    Spouse name: Not on file  . Number of children: Not on file  . Years of education: Not on file  . Highest education level: Not on file  Occupational History  . Occupation: retired   Tobacco Use  . Smoking status: Former Smoker    Quit date: 03/27/1983    Years since quitting: 37.0  . Smokeless tobacco: Never Used  Vaping Use  . Vaping Use: Never used  Substance and Sexual Activity  . Alcohol use: No  . Drug use: No  . Sexual activity: Not Currently  Other Topics Concern  . Not on file  Social History Narrative  . Not on file   Social Determinants of Health   Financial Resource Strain: Not on file  Food Insecurity: Not on file  Transportation Needs: Not on file  Physical Activity: Not on file  Stress: Not on file  Social Connections: Not on file  Intimate Partner Violence: Not on file   Family History  Problem Relation Age of Onset  . Hypertension Father       VITAL SIGNS BP (!) 110/50   Pulse 76   Temp 98.2 F (36.8 C)   Resp 20   Ht $R'5\' 11"'fl$  (1.803 m)   Wt 200 lb 13.4 oz (91.1 kg)   SpO2 92%  BMI 28.01 kg/m   Outpatient Encounter Medications as of 04/14/2020  Medication Sig  . albuterol (PROVENTIL HFA;VENTOLIN HFA) 108 (90 Base) MCG/ACT inhaler Inhale 2 puffs into the lungs every 4 (four) hours as needed for wheezing or shortness of breath.  . allopurinol (ZYLOPRIM) 100 MG tablet Take 100 mg by mouth daily.  . Amino Acids-Protein Hydrolys (FEEDING SUPPLEMENT, PRO-STAT SUGAR FREE 64,) LIQD Take 30 mLs by mouth 3 (three) times daily with meals. for albumin 2.3  . amoxicillin-clavulanate (AUGMENTIN) 875-125 MG tablet Take 1 tablet by mouth 2 (two) times daily for 2 days.  Roseanne Kaufman [place]-Castor Oil (VENELEX) OINT Apply topically in the morning, at noon, and at bedtime. Special Instructions: Apply to sacrum, coccyx and  bilateral buttocks qshift for prevention.  . brimonidine (ALPHAGAN) 0.15 % ophthalmic solution Place 1 drop into both eyes 2 (two) times daily.  . cholecalciferol (VITAMIN D3) 10 MCG (400 UNIT) TABS tablet Take 400 Units by mouth daily.  . feeding supplement (ENSURE ENLIVE / ENSURE PLUS) LIQD Take 237 mLs by mouth 2 (two) times daily between meals.  Marland Kitchen ketotifen (ZADITOR) 0.025 % ophthalmic solution Place 1 drop into both eyes 2 (two) times daily.  Marland Kitchen lactulose (CHRONULAC) 10 GM/15ML solution Take 10 g by mouth daily. for ammonia level of 50  . latanoprost (XALATAN) 0.005 % ophthalmic solution Place 1 drop into both eyes at bedtime.  Marland Kitchen levothyroxine (SYNTHROID) 50 MCG tablet Take 50 mcg by mouth daily.  . Multiple Vitamins-Minerals (PRESERVISION AREDS PO) Take 1 tablet by mouth 2 (two) times daily.  . NON FORMULARY Diet: _____ Regular, ___x___ NAS, _______Consistent Carbohydrate, _______NPO _____Other  . [DISCONTINUED] meclizine (ANTIVERT) 12.5 MG tablet Take 12.5 mg by mouth 2 (two) times daily as needed for dizziness.   No facility-administered encounter medications on file as of 04/14/2020.     SIGNIFICANT DIAGNOSTIC EXAMS   PREVIOUS   03-29-20: chest x-ray: Cardiomegaly with diffuse bilateral pulmonary infiltrates/edema and bilateral pleural effusions. Findings most consistent CHF. Bilateral pneumonia cannot be excluded  03-29-20: abdominal ultrasound:  1. The liver demonstrates evidence of advanced cirrhosis by ultrasound. 2. Moderate ascites surrounding the liver in the right upper quadrant.  03-30-20: 2-d echo Left Ventricle: Left ventricular ejection fraction, by estimation, is 70  to 75%. The left ventricle has hyperdynamic function. The left ventricle  has no regional wall motion abnormalities. The left ventricular internal  cavity size was normal in size   NO NEW EXAMS.   LABS REVIEWED: PREVIOUS   03-30-19: wbc 6.1; hgb 11.0; hct 33.0 mcv 100.0; plt 121; glucose 92; bun 19;  creat 1.0; k+ 4.1; na++ 137; ca 8.7; GFR >60; ast 52 total bili 2.3 BNP 129 tsh 6.366 03-30-20: glucose 82; bun 19; creat 0.95; k+ 3.7; na++ 138; ca 8.2 GFR >60; free t4: 1.32 04-01-20: glucose 81; bun 20; creat 1.09; k+ 4.0; na++ 136; ca 8.3 GFR >60 04-05-20: wbc 5.3; hgb 9.7; hct 27.5; mcv 97.6 plt 98; glucose 77; bun 31; creat 0.91; k+ 4.0; na++ 136; ca 8.2 ast 44; total bili 2.2 albumin 2.0 GFR >60  TODAY  04-10-20: wbc 8.3; hgb 11.1; hct 31.2; mcv 95.7 plt 139; glucose 138; bun 42; creat 1.17; k+ 4.0; na++ 136; ca 8.8 GFR>60; ast 57; alk phos 164; total bili: 2.4 urine culture: <10,000 04-11-20: wbc 11.2; hgb 9.5;hct 27.8   mcv 97.5 pltt 109; glucose 118; bun 41; creat 1.14; k+ 4.1; na++ 139; ca 8.7; GFR>60 mag 1.9 ast 45; alk phos 136; total  bili 2.4 tsh 2.191 04-12-20: ammonia 12; vit B 12: 596; iron 51; tibc 151    Review of Systems  Constitutional: Negative for malaise/fatigue.  Respiratory: Negative for cough and shortness of breath.   Cardiovascular: Positive for leg swelling. Negative for chest pain and palpitations.  Gastrointestinal: Negative for abdominal pain, constipation and heartburn.  Musculoskeletal: Negative for back pain, joint pain and myalgias.  Skin: Negative.   Neurological: Negative for dizziness.  Psychiatric/Behavioral: The patient is not nervous/anxious.     Physical Exam Constitutional:      General: He is not in acute distress.    Appearance: He is well-developed and well-nourished. He is not diaphoretic.  Neck:     Thyroid: No thyromegaly.  Cardiovascular:     Rate and Rhythm: Normal rate and regular rhythm.     Pulses: Normal pulses and intact distal pulses.     Heart sounds: Normal heart sounds.  Pulmonary:     Effort: Pulmonary effort is normal. No respiratory distress.     Breath sounds: Normal breath sounds.  Abdominal:     General: Bowel sounds are normal. There is no distension.     Palpations: Abdomen is soft.     Tenderness: There is no  abdominal tenderness.  Musculoskeletal:     Cervical back: Neck supple.     Right lower leg: Edema present.     Left lower leg: Edema present.     Comments: Is able to move all extremities Has  bilateral lower extremity edema     Lymphadenopathy:     Cervical: No cervical adenopathy.  Skin:    General: Skin is warm and dry.  Neurological:     Mental Status: He is alert and oriented to person, place, and time.  Psychiatric:        Mood and Affect: Mood and affect and mood normal.        ASSESSMENT/ PLAN:   TODAY  1. Acute on chronic diastolic CHF (congestive heart failure)/ acute pulmonary edema is stable EF 70-75% (03-29-20): is now off lasix  will monitor his status.   2. Hypothyroidism unspecified type: is stable tsh 2.191 will continue synthroid 50 mcg daily   3. Overweight (BMI 25.0-29.9) BMI is 28.01 will monitor   4. History of bladder cancer: 2016 :is status post transurethral resection of bladder 03-10-15 will monitor   5. Primary open angle glaucoma of both eyes  unspecified glaucoma stage is stable will continue alphagan to both eyes twice daily; zaditor to both eyes twice daily; xalatan to both eyes.   6. Generalized weakness: is without change will continue therapy as directed to improve upon his level of independence with his adls.   7. 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 Cirrhosis of liver with ascites unspecified hepatic cirrhosis type: (per ultrasound on 03-29-20)  LFT's elevated will monitor   9. Benign vertigo: is stable had syncopal episode in Oct 2020; is off meclizine  10. Thrombocytopenia: is stable plt 109 will monitor   11. Protein calorie malnutrition, severe: is without change albumin 2.0 will continue  prostat 30 cc three times daily and ensure twice daily   12. Normochromic microcytic anemia: is without change hgb 9.5 will monitor       MD is aware of resident's  narcotic use and is in agreement with current plan of care. We will attempt to wean resident as appropriate.  Ok Edwards NP [facility]  Adult Medicine  Contact [phone] Monday through Friday 8am- 5pm  After hours call [phone]

## 2020-04-20 ENCOUNTER — Encounter: Payer: Self-pay | Admitting: Internal Medicine

## 2020-04-20 ENCOUNTER — Other Ambulatory Visit (HOSPITAL_COMMUNITY)
Admission: RE | Admit: 2020-04-20 | Discharge: 2020-04-20 | Disposition: A | Discharge status: Home / Self Care | Payer: Medicare PPO | Source: Skilled Nursing Facility | Attending: Adult Health | Admitting: Adult Health

## 2020-04-20 ENCOUNTER — Non-Acute Institutional Stay (INDEPENDENT_AMBULATORY_CARE_PROVIDER_SITE_OTHER): Payer: Self-pay | Admitting: Internal Medicine

## 2020-04-20 DIAGNOSIS — G9340 Encephalopathy, unspecified: Secondary | ICD-10-CM

## 2020-04-20 DIAGNOSIS — D539 Nutritional anemia, unspecified: Secondary | ICD-10-CM | POA: Insufficient documentation

## 2020-04-20 DIAGNOSIS — D649 Anemia, unspecified: Secondary | ICD-10-CM | POA: Insufficient documentation

## 2020-04-20 DIAGNOSIS — J9601 Acute respiratory failure with hypoxia: Secondary | ICD-10-CM

## 2020-04-20 DIAGNOSIS — I5033 Acute on chronic diastolic (congestive) heart failure: Secondary | ICD-10-CM | POA: Insufficient documentation

## 2020-04-20 LAB — COMPREHENSIVE METABOLIC PANEL
ALT: 24 U/L (ref 0–44)
AST: 46 U/L — ABNORMAL HIGH (ref 15–41)
Albumin: 2 g/dL — ABNORMAL LOW (ref 3.5–5.0)
Alkaline Phosphatase: 150 U/L — ABNORMAL HIGH (ref 38–126)
Anion gap: 6 (ref 5–15)
BUN: 32 mg/dL — ABNORMAL HIGH (ref 8–23)
CO2: 25 mmol/L (ref 22–32)
Calcium: 8.4 mg/dL — ABNORMAL LOW (ref 8.9–10.3)
Chloride: 104 mmol/L (ref 98–111)
Creatinine, Ser: 1.01 mg/dL (ref 0.61–1.24)
GFR, Estimated: >60 mL/min (ref >60)
Glucose, Bld: 152 mg/dL — ABNORMAL HIGH (ref 70–99)
Potassium: 4.6 mmol/L (ref 3.5–5.1)
Sodium: 135 mmol/L (ref 135–145)
Total Bilirubin: 2.2 mg/dL — ABNORMAL HIGH (ref 0.3–1.2)
Total Protein: 5.9 g/dL — ABNORMAL LOW (ref 6.5–8.1)

## 2020-04-20 LAB — CBC
HCT: 33.1 % — ABNORMAL LOW (ref 39.0–52.0)
Hemoglobin: 11 g/dL — ABNORMAL LOW (ref 13.0–17.0)
MCH: 33.7 pg (ref 26.0–34.0)
MCHC: 33.2 g/dL (ref 30.0–36.0)
MCV: 101.5 fL — ABNORMAL HIGH (ref 80.0–100.0)
Platelets: 142 10*3/uL — ABNORMAL LOW (ref 150–400)
RBC: 3.26 MIL/uL — ABNORMAL LOW (ref 4.22–5.81)
RDW: 15.5 % (ref 11.5–15.5)
WBC: 8.3 10*3/uL (ref 4.0–10.5)
nRBC: 0 % (ref 0.0–0.2)

## 2020-04-20 LAB — OCCULT BLOOD X 1 CARD TO LAB, STOOL: Fecal Occult Bld: NEGATIVE

## 2020-04-20 NOTE — Assessment & Plan Note (Signed)
04/20/2020 H/H 11/33.1, MCV 101.5, platelet count 142,000. 1/18 B12 level 596 and folate level greater than 20. PT/INR because of history of cirrhosis. Monitor CBC.

## 2020-04-20 NOTE — Progress Notes (Unsigned)
NURSING HOME LOCATION: [facility] ROOM NUMBER: 153/P   CODE STATUS:  Full Code  PCP: Doree Albee, MD   This is a comprehensive readmission note to [facility] performed on this date less than 30 days from date of readmission. Included are preadmission medical/surgical history; reconciled medication list; family history; social history and comprehensive review of systems.  Corrections and additions to the records were documented. Comprehensive physical exam was also performed. Additionally a clinical summary was entered for each active diagnosis pertinent to this admission in the Problem List to enhance continuity of care.  HPI: Patient was hospitalized 1/16-1/19/2022, admitted from the nursing home where he is a permanent resident with possible aspiration. The patient was observed having emesis followed by progressive shortness of breath through the day. Subsequently he developed a fever as well. Typically the patient is described as talkative and interactive but upon presentation to ED it required 3 people to sit him up in bed and the only vocalization was "grunting" when asked questions. Labs did not reveal significant deviation from baseline. Chest x-ray did reveal bibasilar opacification with interval worsening of right base effusions and atelectasis. He was admitted with acute multifactorial encephalopathy in the setting of hypoxemia in the context of suspected aspiration pneumonia.  He was transitioned from empiric Unasyn to 2 additional days of oral Augmentin.He was able to be weaned to room air. The encephalopathic picture did improve. Bedside swallowing study resulted in recommendation for dysphagia 3 diet. Nutrition consulted for severe protein caloric malnutrition and feeding supplement was ordered.  Past medical and surgical history: Includes chronic anemia, thrombocytopenia, hepatic cirrhosis, chronic diastolic congestive heart failure, history of vitamin D  deficiency, macular degeneration, history of gout, and hypothyroidism. Surgeries and procedures include bladder surgery, cholecystectomy, and surgery for retinal detachment.  Social history: Nondrinker, former smoker.  Family history: Limited history reviewed. It is not contributory due to advanced age.   Review of systems: Staff has noted evidence of bleeding in the stool and urine. He is on no anticoagulants.  He does have a history of cirrhosis of which he was not aware.  He states he never drank alcohol.  He did state that he had a cyst removed from his bladder in 2015 because of hematuria.  The problem list includes a history of bladder cancer in 2016.  He describes chronic constipation.  He also has some nonproductive cough.  Constitutional: No fever, significant weight change, fatigue  Eyes: No redness, discharge, pain, vision change ENT/mouth: No nasal congestion, purulent discharge, earache, change in hearing, sore throat  Cardiovascular: No chest pain, palpitations, paroxysmal nocturnal dyspnea, claudication, edema  Respiratory: No  sputum production, hemoptysis, DOE, significant snoring, apnea  Gastrointestinal: No heartburn, dysphagia, abdominal pain, nausea /vomiting, melena Genitourinary: No dysuria, pyuria, incontinence, nocturia Musculoskeletal: No joint stiffness, joint swelling, weakness, pain Dermatologic: No new rash, pruritus, change in appearance of skin Neurologic: No dizziness, headache, syncope, seizures, numbness, tingling Psychiatric: No significant anxiety, depression, insomnia, anorexia Endocrine: No change in hair/skin/nails, excessive thirst, excessive hunger, excessive urination  Hematologic/lymphatic: No significant bruising, lymphadenopathy, abnormal bleeding Allergy/immunology: No itchy/watery eyes, significant sneezing, urticaria, angioedema  Physical exam:  Pertinent or positive findings: He appears chronically ill and face is markedly weathered.  Mouth  is agape.  Hair is disheveled.  His speech is slurred and harsh with intermittent breaking.  There is slight exotropia of the right eye.  He has bilateral ptosis.  There is erythema of the soft palate w/o exudate.  He has a vascular nevus at the right lateral lower lip.  Heart rhythm is irregular.  Heart sounds are heard best at the lower left sternal border.  He has diffuse low-grade rales.  Bowel sounds are hyperactive.  Abdomen is distended and his pants are unbuckled.  He denies any tenderness to palpation.  Pedal pulses not palpable as the lower extremities are wrapped.  Opposition testing reveals fair strength in the extremities.  He has scattered freckling, bruising, and keratoses over the upper extremities.  General appearance: no acute distress, increased work of breathing is present.   Lymphatic: No lymphadenopathy about the head, neck, axilla. Eyes: No conjunctival inflammation or lid edema is present. There is no scleral icterus. Ears:  External ear exam shows no significant lesions or deformities.   Nose:  External nasal examination shows no deformity or inflammation. Nasal mucosa are pink and moist without lesions, exudates Neck:  No thyromegaly, masses, tenderness noted.    Heart:  No gallop, murmur, click, rub.  Lungs:  without wheezes, rhonchi,  rubs. Abdomen: Bowel sounds are normal.  Abdominal exam reveals no organomegaly, hernias, masses. GU: Deferred  Extremities:  No cyanosis, clubbing Neurologic exam: Balance, Rhomberg, finger to nose testing could not be completed due to clinical state Skin: Warm & dry w/o tenting.  See clinical summary under each active problem in the Problem List with associated updated therapeutic plan

## 2020-04-20 NOTE — Patient Instructions (Signed)
See assessment and plan under each diagnosis in the problem list and acutely for this visit 

## 2020-04-20 NOTE — Assessment & Plan Note (Addendum)
1/11 ammonia level was elevated at 50 with normals less than 35.  On 1/18 ammonia level was 27. He is lethargic but provides an adequate history.  Acute encephalopathy has resolved.

## 2020-04-21 ENCOUNTER — Other Ambulatory Visit (HOSPITAL_COMMUNITY)
Admission: RE | Admit: 2020-04-21 | Discharge: 2020-04-21 | Disposition: A | Discharge status: Home / Self Care | Payer: Medicare PPO | Source: Skilled Nursing Facility | Attending: Adult Health | Admitting: Adult Health

## 2020-04-21 DIAGNOSIS — K7460 Unspecified cirrhosis of liver: Secondary | ICD-10-CM | POA: Diagnosis not present

## 2020-04-21 LAB — PROTIME-INR
INR: 1.8 — ABNORMAL HIGH (ref 0.8–1.2)
Prothrombin Time: 19.9 seconds — ABNORMAL HIGH (ref 11.4–15.2)

## 2020-04-21 NOTE — Assessment & Plan Note (Signed)
Speech therapy to monitor for dysphagia and aspiration.  He still exhibits respiratory compromise with diffuse rales.

## 2020-04-23 ENCOUNTER — Encounter (HOSPITAL_COMMUNITY)
Admission: RE | Admit: 2020-04-23 | Discharge: 2020-04-23 | Disposition: A | Discharge status: Home / Self Care | Payer: Medicare PPO | Source: Skilled Nursing Facility | Attending: Adult Health | Admitting: Adult Health

## 2020-04-23 DIAGNOSIS — D649 Anemia, unspecified: Secondary | ICD-10-CM | POA: Insufficient documentation

## 2020-04-23 LAB — OCCULT BLOOD X 1 CARD TO LAB, STOOL: Fecal Occult Bld: NEGATIVE

## 2020-04-28 ENCOUNTER — Non-Acute Institutional Stay (INDEPENDENT_AMBULATORY_CARE_PROVIDER_SITE_OTHER): Payer: Self-pay | Admitting: Adult Health

## 2020-04-28 ENCOUNTER — Encounter: Payer: Self-pay | Admitting: Adult Health

## 2020-04-28 DIAGNOSIS — I5032 Chronic diastolic (congestive) heart failure: Secondary | ICD-10-CM

## 2020-04-28 DIAGNOSIS — K7460 Unspecified cirrhosis of liver: Secondary | ICD-10-CM

## 2020-04-28 DIAGNOSIS — D696 Thrombocytopenia, unspecified: Secondary | ICD-10-CM

## 2020-04-28 DIAGNOSIS — E43 Unspecified severe protein-calorie malnutrition: Secondary | ICD-10-CM

## 2020-04-28 DIAGNOSIS — R188 Other ascites: Secondary | ICD-10-CM

## 2020-04-28 NOTE — Progress Notes (Signed)
Location:  [facility] Room Number: 153/P Place of Service:  SNF (31)   CODE STATUS: Full Code  Allergies  Allergen Reactions  . Cephalexin Hives and Other (See Comments)    Chief Complaint  Patient presents with  . Acute Visit    Care Plan Meeting     HPI:  We have come together for his care plan meeting. Family present   BIMS 14/15 mood 4/30. He requires extensive assistance with hier adls; he is able to feed himself. He is nonambulatory. He is frequently incontinent of bladder and bowel. He had 3 guaiac all negative. Therapy: standing: ambulating: minimal assistance; dressing: upper good lower body needs mostly help. For toileting needs help with pants. Has 14 steps at home. He can do 8 steps continuously will move bed to first floor. He needs 24 hour supervision. Can ambulate with walker; but wheelchair will be better. A modified BAS is pending. On mech soft diet thin liquids. Needs good oral care before liquids given. Weight is stable appetite is somewhat poor.  No reports of falls.  He continue to be followed for his chronic illnesses including: Chronic diastolic CHF (congestive heart failure). Protein calorie malnutrition severe Thrombocytopenia  Cirrhosis of liver with ascites unspecified hepatic cirrhosis type   Past Medical History:  Diagnosis Date  . Acute lower UTI 01/10/2019  . Arthritis   . Chronic diastolic (congestive) heart failure ([place])   . Cirrhosis of liver ([place])   . Glaucoma   . Gout   . Macular degeneration   . Thyroid disease   . Vitamin D deficiency     Past Surgical History:  Procedure Laterality Date  . BLADDER SURGERY    . CATARACT EXTRACTION, BILATERAL    . CHOLECYSTECTOMY  11/30/2011   Procedure: LAPAROSCOPIC CHOLECYSTECTOMY;  Surgeon: Jamesetta So, MD;  Location: [facility];  Service: General;  Laterality: N/A;  . CYSTOSCOPY/RETROGRADE/URETEROSCOPY Bilateral 03/10/2015   Procedure: Consuela Mimes BILATERAL Concha Se;  Surgeon:  Irine Seal, MD;  Location: [facility];  Service: Urology;  Laterality: Bilateral;  . LYMPH NODE BIOPSY  1975   right  . RETINAL DETACHMENT SURGERY     right eye  . TRANSURETHRAL RESECTION OF BLADDER TUMOR Bilateral 03/10/2015   Procedure: TRANSURETHRAL RESECTION OF BLADDER TUMOR (TURBT) INSTILLATION WITH MITOMYCIN C IN RECOVERY;  Surgeon: Irine Seal, MD;  Location: [facility];  Service: Urology;  Laterality: Bilateral;  . TYMPANOPLASTY     right    Social History   Socioeconomic History  . Marital status: Married    Spouse name: Not on file  . Number of children: Not on file  . Years of education: Not on file  . Highest education level: Not on file  Occupational History  . Occupation: retired   Tobacco Use  . Smoking status: Former Smoker    Quit date: 03/27/1983    Years since quitting: 37.1  . Smokeless tobacco: Never Used  Vaping Use  . Vaping Use: Never used  Substance and Sexual Activity  . Alcohol use: No  . Drug use: No  . Sexual activity: Not Currently  Other Topics Concern  . Not on file  Social History Narrative  . Not on file   Social Determinants of Health   Financial Resource Strain: Not on file  Food Insecurity: Not on file  Transportation Needs: Not on file  Physical Activity: Not on file  Stress: Not on file  Social Connections: Not on file  Intimate Partner Violence: Not on  file   Family History  Problem Relation Age of Onset  . Hypertension Father       VITAL SIGNS BP (!) 107/55   Pulse 82   Temp 98.6 F (37 C)   Resp 20   Ht _0  (1.803 m)   Wt 193 lb (87.5 kg)   SpO2 95%   BMI 26.92 kg/m   Outpatient Encounter Medications as of 04/28/2020  Medication Sig  . albuterol (PROVENTIL HFA;VENTOLIN HFA) 108 (90 Base) MCG/ACT inhaler Inhale 2 puffs into the lungs every 4 (four) hours as needed for wheezing or shortness of breath.  . allopurinol (ZYLOPRIM) 100 MG tablet Take 100 mg by mouth daily.  . Amino Acids-Protein Hydrolys (FEEDING  SUPPLEMENT, PRO-STAT SUGAR FREE 64,) LIQD Take 30 mLs by mouth 3 (three) times daily with meals. for albumin 2.3  . Balsam [place]-Castor Oil (VENELEX) OINT Apply topically in the morning, at noon, and at bedtime. Special Instructions: Apply to sacrum, coccyx and bilateral buttocks qshift for prevention.  . brimonidine (ALPHAGAN) 0.15 % ophthalmic solution Place 1 drop into both eyes 2 (two) times daily.  . cholecalciferol (VITAMIN D3) 10 MCG (400 UNIT) TABS tablet Take 400 Units by mouth daily.  . feeding supplement (ENSURE ENLIVE / ENSURE PLUS) LIQD Take 237 mLs by mouth 2 (two) times daily between meals.  Marland Kitchen ketotifen (ZADITOR) 0.025 % ophthalmic solution Place 1 drop into both eyes 2 (two) times daily.  Marland Kitchen lactulose (CHRONULAC) 10 GM/15ML solution Take 10 g by mouth daily. for ammonia level of 50  . latanoprost (XALATAN) 0.005 % ophthalmic solution Place 1 drop into both eyes at bedtime.  Marland Kitchen levothyroxine (SYNTHROID) 50 MCG tablet Take 50 mcg by mouth daily.  . Multiple Vitamins-Minerals (PRESERVISION AREDS PO) Take 1 tablet by mouth 2 (two) times daily.  . NON FORMULARY Diet Change: Dys 3 (mechanical soft), thin liquids (NO STRAW); NAS   No facility-administered encounter medications on file as of 04/28/2020.     SIGNIFICANT DIAGNOSTIC EXAMS   PREVIOUS   03-29-20: chest x-ray: Cardiomegaly with diffuse bilateral pulmonary infiltrates/edema and bilateral pleural effusions. Findings most consistent CHF. Bilateral pneumonia cannot be excluded  03-29-20: abdominal ultrasound:  1. The liver demonstrates evidence of advanced cirrhosis by ultrasound. 2. Moderate ascites surrounding the liver in the right upper quadrant.  03-30-20: 2-d echo Left Ventricle: Left ventricular ejection fraction, by estimation, is 70  to 75%. The left ventricle has hyperdynamic function. The left ventricle  has no regional wall motion abnormalities. The left ventricular internal  cavity size was normal in size   NO NEW  EXAMS.   LABS REVIEWED: PREVIOUS   03-30-19: wbc 6.1; hgb 11.0; hct 33.0 mcv 100.0; plt 121; glucose 92; bun 19; creat 1.0; k+ 4.1; na++ 137; ca 8.7; GFR >60; ast 52 total bili 2.3 BNP 129 tsh 6.366 03-30-20: glucose 82; bun 19; creat 0.95; k+ 3.7; na++ 138; ca 8.2 GFR >60; free t4: 1.32 04-01-20: glucose 81; bun 20; creat 1.09; k+ 4.0; na++ 136; ca 8.3 GFR >60 04-05-20: wbc 5.3; hgb 9.7; hct 27.5; mcv 97.6 plt 98; glucose 77; bun 31; creat 0.91; k+ 4.0; na++ 136; ca 8.2 ast 44; total bili 2.2 albumin 2.0 GFR >60 04-10-20: wbc 8.3; hgb 11.1; hct 31.2; mcv 95.7 plt 139; glucose 138; bun 42; creat 1.17; k+ 4.0; na++ 136; ca 8.8 GFR>60; ast 57; alk phos 164; total bili: 2.4 urine culture: <10,000 04-11-20: wbc 11.2; hgb 9.5;hct 27.8   mcv 97.5 pltt 109; glucose  118; bun 41; creat 1.14; k+ 4.1; na++ 139; ca 8.7; GFR>60 mag 1.9 ast 45; alk phos 136; total bili 2.4 tsh 2.191 04-12-20: ammonia 12; vit B 12: 596; iron 51; tibc 151   NO NEW LABS.    Review of Systems  Constitutional: Negative for malaise/fatigue.  Respiratory: Negative for cough and shortness of breath.   Cardiovascular: Negative for chest pain, palpitations and leg swelling.  Gastrointestinal: Negative for abdominal pain, constipation and heartburn.  Musculoskeletal: Negative for back pain, joint pain and myalgias.  Skin: Negative.   Neurological: Negative for dizziness.  Psychiatric/Behavioral: The patient is not nervous/anxious.     Physical Exam Constitutional:      General: He is not in acute distress.    Appearance: He is well-developed and well-nourished. He is not diaphoretic.  Neck:     Thyroid: No thyromegaly.  Cardiovascular:     Rate and Rhythm: Normal rate and regular rhythm.     Pulses: Normal pulses and intact distal pulses.     Heart sounds: Normal heart sounds.  Pulmonary:     Effort: Pulmonary effort is normal. No respiratory distress.     Breath sounds: Normal breath sounds.  Abdominal:     General: Bowel  sounds are normal. There is no distension.     Palpations: Abdomen is soft.     Tenderness: There is no abdominal tenderness.  Musculoskeletal:     Cervical back: Neck supple.     Right lower leg: Edema present.     Left lower leg: Edema present.     Comments: Is able to move all extremities Has  bilateral lower extremity edema      Lymphadenopathy:     Cervical: No cervical adenopathy.  Skin:    General: Skin is warm and dry.  Neurological:     Mental Status: He is alert and oriented to person, place, and time.  Psychiatric:        Mood and Affect: Mood and affect and mood normal.      ASSESSMENT/ PLAN:  TODAY  1. Chronic diastolic CHF (congestive heart failure) 2. Protein calorie malnutrition severe 3. Thrombocytopenia 4. Cirrhosis of liver with ascites unspecified hepatic cirrhosis type   Will continue current medications Will continue therapy as directed Will continue current plan of care Will continue to monitor his status.  Awaiting modified barium swallow  His goal remains to return back home. He will need a wheelchair and home health  Will need palliavate care  At home has been setup.   MD is aware of resident's narcotic use and is in agreement with current plan of care. We will attempt to wean resident as appropriate.  Ok Edwards NP [facility]  Contact [phone] Monday through Friday 8am- 5pm  After hours call [phone]

## 2020-04-29 ENCOUNTER — Other Ambulatory Visit: Payer: Self-pay | Admitting: Adult Health

## 2020-04-29 ENCOUNTER — Encounter: Payer: Self-pay | Admitting: Adult Health

## 2020-04-29 ENCOUNTER — Non-Acute Institutional Stay (INDEPENDENT_AMBULATORY_CARE_PROVIDER_SITE_OTHER): Payer: Self-pay | Admitting: Adult Health

## 2020-04-29 DIAGNOSIS — R188 Other ascites: Secondary | ICD-10-CM

## 2020-04-29 DIAGNOSIS — K7460 Unspecified cirrhosis of liver: Secondary | ICD-10-CM

## 2020-04-29 DIAGNOSIS — E43 Unspecified severe protein-calorie malnutrition: Secondary | ICD-10-CM

## 2020-04-29 DIAGNOSIS — D696 Thrombocytopenia, unspecified: Secondary | ICD-10-CM

## 2020-04-29 DIAGNOSIS — I5033 Acute on chronic diastolic (congestive) heart failure: Secondary | ICD-10-CM

## 2020-04-29 MED ORDER — KETOTIFEN FUMARATE 0.025 % OP SOLN: 1.0000 [drp] | Freq: Two times a day (BID) | OPHTHALMIC | 0 refills | Status: AC

## 2020-04-29 MED ORDER — ALBUTEROL SULFATE HFA 108 (90 BASE) MCG/ACT IN AERS: 2.0000 | INHALATION_SPRAY | RESPIRATORY_TRACT | 0 refills | Status: AC | PRN

## 2020-04-29 MED ORDER — LACTULOSE 10 GM/15ML PO SOLN: 10.0000 g | Freq: Every day | ORAL | 0 refills | Status: DC

## 2020-04-29 MED ORDER — LEVOTHYROXINE SODIUM 50 MCG PO TABS: 50.0000 ug | ORAL_TABLET | Freq: Every day | ORAL | 0 refills | Status: AC

## 2020-04-29 MED ORDER — LATANOPROST 0.005 % OP SOLN: 1.0000 [drp] | Freq: Every day | OPHTHALMIC | 0 refills | Status: AC

## 2020-04-29 MED ORDER — BRIMONIDINE TARTRATE 0.15 % OP SOLN: 1.0000 [drp] | Freq: Two times a day (BID) | OPHTHALMIC | 0 refills | Status: AC

## 2020-04-29 MED ORDER — ALLOPURINOL 100 MG PO TABS: 100.0000 mg | ORAL_TABLET | Freq: Every day | ORAL | 0 refills | Status: AC

## 2020-04-29 NOTE — Progress Notes (Signed)
Location:  [facility] Room Number: 153-P Place of Service:  SNF (31)    CODE STATUS: FULL CODE  Allergies  Allergen Reactions  . Cephalexin Hives and Other (See Comments)    Chief Complaint  Patient presents with  . Discharge Note    Patient is seen for discharge from SNF    HPI:  He is being discharged to home with home health for pt/ot. He will need a wheelchair. He will need his prescriptions written and to follow up with his medical provider. He had been hospitalized for acute on chronic congestive heart failure; cirrhosis of liver. He was admitted to this facility for short term rehab. He has participated in both pt/ot to improve upon his level of independence with his adls. He is now ready to complete his therapy on a home health basis.   Past Medical History:  Diagnosis Date  . Acute lower UTI 01/10/2019  . Arthritis   . Chronic diastolic (congestive) heart failure ([place])   . Cirrhosis of liver ([place])   . Glaucoma   . Gout   . Macular degeneration   . Thyroid disease   . Vitamin D deficiency     Past Surgical History:  Procedure Laterality Date  . BLADDER SURGERY    . CATARACT EXTRACTION, BILATERAL    . CHOLECYSTECTOMY  11/30/2011   Procedure: LAPAROSCOPIC CHOLECYSTECTOMY;  Surgeon: Jamesetta So, MD;  Location: [facility];  Service: General;  Laterality: N/A;  . CYSTOSCOPY/RETROGRADE/URETEROSCOPY Bilateral 03/10/2015   Procedure: Consuela Mimes BILATERAL Concha Se;  Surgeon: Irine Seal, MD;  [facility];  Service: Urology;  Laterality: Bilateral;  . LYMPH NODE BIOPSY  1975   right  . RETINAL DETACHMENT SURGERY     right eye  . TRANSURETHRAL RESECTION OF BLADDER TUMOR Bilateral 03/10/2015   Procedure: TRANSURETHRAL RESECTION OF BLADDER TUMOR (TURBT) INSTILLATION WITH MITOMYCIN C IN RECOVERY;  Surgeon: Irine Seal, MD;  Location[facility];  Service: Urology;  Laterality: Bilateral;  . TYMPANOPLASTY     right    Social History    Socioeconomic History  . Marital status: Married    Spouse name: Not on file  . Number of children: Not on file  . Years of education: Not on file  . Highest education level: Not on file  Occupational History  . Occupation: retired   Tobacco Use  . Smoking status: Former Smoker    Quit date: 03/27/1983    Years since quitting: 37.1  . Smokeless tobacco: Never Used  Vaping Use  . Vaping Use: Never used  Substance and Sexual Activity  . Alcohol use: No  . Drug use: No  . Sexual activity: Not Currently  Other Topics Concern  . Not on file  Social History Narrative  . Not on file   Social Determinants of Health   Financial Resource Strain: Not on file  Food Insecurity: Not on file  Transportation Needs: Not on file  Physical Activity: Not on file  Stress: Not on file  Social Connections: Not on file  Intimate Partner Violence: Not on file   Family History  Problem Relation Age of Onset  . Hypertension Father     VITAL SIGNS BP (!) 107/55   Pulse 82   Temp 98.6 F (37 C) (Oral)   Resp 20   Ht _0  (1.803 m)   Wt 193 lb (87.5 kg)   SpO2 95%   BMI 26.92 kg/m   Patient's Medications  New Prescriptions  No medications on file  Previous Medications   ALBUTEROL (PROVENTIL HFA;VENTOLIN HFA) 108 (90 BASE) MCG/ACT INHALER    Inhale 2 puffs into the lungs every 4 (four) hours as needed for wheezing or shortness of breath.   ALLOPURINOL (ZYLOPRIM) 100 MG TABLET    Take 100 mg by mouth daily.   AMINO ACIDS-PROTEIN HYDROLYS (FEEDING SUPPLEMENT, PRO-STAT SUGAR FREE 64,) LIQD    Take 30 mLs by mouth 3 (three) times daily with meals. for albumin 2.3   BALSAM PERU-CASTOR OIL (VENELEX) OINT    Apply topically in the morning, at noon, and at bedtime. Special Instructions: Apply to sacrum, coccyx and bilateral buttocks qshift for prevention.   BRIMONIDINE (ALPHAGAN) 0.15 % OPHTHALMIC SOLUTION    Place 1 drop into both eyes 2 (two) times daily.   CHOLECALCIFEROL (VITAMIN D3) 10  MCG (400 UNIT) TABS TABLET    Take 400 Units by mouth daily.   FEEDING SUPPLEMENT (ENSURE ENLIVE / ENSURE PLUS) LIQD    Take 237 mLs by mouth 2 (two) times daily between meals.   KETOTIFEN (ZADITOR) 0.025 % OPHTHALMIC SOLUTION    Place 1 drop into both eyes 2 (two) times daily.   LACTULOSE (CHRONULAC) 10 GM/15ML SOLUTION    Take 10 g by mouth daily. for ammonia level of 50   LATANOPROST (XALATAN) 0.005 % OPHTHALMIC SOLUTION    Place 1 drop into both eyes at bedtime.   LEVOTHYROXINE (SYNTHROID) 50 MCG TABLET    Take 50 mcg by mouth daily.   MULTIPLE VITAMINS-MINERALS (PRESERVISION AREDS PO)    Take 1 tablet by mouth 2 (two) times daily.   NON FORMULARY    Diet Change: Dys 3 (mechanical soft), thin liquids (NO STRAW); NAS  Modified Medications   No medications on file  Discontinued Medications   No medications on file     SIGNIFICANT DIAGNOSTIC EXAMS   PREVIOUS   03-29-20: chest x-ray: Cardiomegaly with diffuse bilateral pulmonary infiltrates/edema and bilateral pleural effusions. Findings most consistent CHF. Bilateral pneumonia cannot be excluded  03-29-20: abdominal ultrasound:  1. The liver demonstrates evidence of advanced cirrhosis by ultrasound. 2. Moderate ascites surrounding the liver in the right upper quadrant.  03-30-20: 2-d echo Left Ventricle: Left ventricular ejection fraction, by estimation, is 70  to 75%. The left ventricle has hyperdynamic function. The left ventricle  has no regional wall motion abnormalities. The left ventricular internal  cavity size was normal in size   NO NEW EXAMS.   LABS REVIEWED: PREVIOUS   03-30-19: wbc 6.1; hgb 11.0; hct 33.0 mcv 100.0; plt 121; glucose 92; bun 19; creat 1.0; k+ 4.1; na++ 137; ca 8.7; GFR >60; ast 52 total bili 2.3 BNP 129 tsh 6.366 03-30-20: glucose 82; bun 19; creat 0.95; k+ 3.7; na++ 138; ca 8.2 GFR >60; free t4: 1.32 04-01-20: glucose 81; bun 20; creat 1.09; k+ 4.0; na++ 136; ca 8.3 GFR >60 04-05-20: wbc 5.3; hgb 9.7; hct 27.5;  mcv 97.6 plt 98; glucose 77; bun 31; creat 0.91; k+ 4.0; na++ 136; ca 8.2 ast 44; total bili 2.2 albumin 2.0 GFR >60 04-10-20: wbc 8.3; hgb 11.1; hct 31.2; mcv 95.7 plt 139; glucose 138; bun 42; creat 1.17; k+ 4.0; na++ 136; ca 8.8 GFR>60; ast 57; alk phos 164; total bili: 2.4 urine culture: <10,000 04-11-20: wbc 11.2; hgb 9.5;hct 27.8   mcv 97.5 pltt 109; glucose 118; bun 41; creat 1.14; k+ 4.1; na++ 139; ca 8.7; GFR>60 mag 1.9 ast 45; alk phos 136; total bili 2.4  tsh 2.191 04-12-20: ammonia 12; vit B 12: 596; iron 51; tibc 151   NO NEW LABS.   Review of Systems  Constitutional: Negative for malaise/fatigue.  Respiratory: Negative for cough and shortness of breath.   Cardiovascular: Negative for chest pain, palpitations and leg swelling.  Gastrointestinal: Negative for abdominal pain, constipation and heartburn.  Musculoskeletal: Negative for back pain, joint pain and myalgias.  Skin: Negative.   Neurological: Negative for dizziness.  Psychiatric/Behavioral: The patient is not nervous/anxious.     Physical Exam Constitutional:      General: He is not in acute distress.    Appearance: He is well-developed and well-nourished. He is not diaphoretic.  Neck:     Thyroid: No thyromegaly.  Cardiovascular:     Rate and Rhythm: Normal rate and regular rhythm.     Pulses: Normal pulses and intact distal pulses.     Heart sounds: Normal heart sounds.  Pulmonary:     Effort: Pulmonary effort is normal. No respiratory distress.     Breath sounds: Normal breath sounds.  Abdominal:     General: Bowel sounds are normal. There is no distension.     Palpations: Abdomen is soft.     Tenderness: There is no abdominal tenderness.  Musculoskeletal:     Cervical back: Neck supple.     Right lower leg: Edema present.     Left lower leg: Edema present.     Comments: Is able to move all extremities Has  bilateral lower extremity edema       Lymphadenopathy:     Cervical: No cervical adenopathy.   Skin:    General: Skin is warm and dry.  Neurological:     Mental Status: He is alert and oriented to person, place, and time.  Psychiatric:        Mood and Affect: Mood and affect and mood normal.       ASSESSMENT/ PLAN:  Patient is being discharged with the following home health services:  Pt/ot to evaluate and treat as indicated for gait balance strength adl training   Patient is being discharged with the following durable medical equipment:  Standard wheelchair with cushions; elevated leg rests anti tippers to allow him to maintain his current level of independence with his adls which cannot be achieved with cane crutches walker; he is able to propel wheelchair   Patient has been advised to f/u with their PCP in 1-2 weeks to bring them up to date on their rehab stay.  Social services at facility was responsible for arranging this appointment.  Pt was provided with a 30 day supply of prescriptions for medications and refills must be obtained from their PCP.  For controlled substances, a more limited supply may be provided adequate until PCP appointment only.   A 30 day supply of his prescription medications have been sent to [facility]  Time spent with patient: 40 minutes: dme; medications home health.     Ok Edwards NP [facility]  Contact [phone] Monday through Friday 8am- 5pm  After hours call [phone]

## 2020-05-04 ENCOUNTER — Telehealth: Payer: Self-pay | Admitting: Nurse Practitioner

## 2020-05-04 ENCOUNTER — Telehealth (INDEPENDENT_AMBULATORY_CARE_PROVIDER_SITE_OTHER): Payer: Self-pay

## 2020-05-04 NOTE — Telephone Encounter (Signed)
Verbal ok given.

## 2020-05-04 NOTE — Telephone Encounter (Signed)
Was discharged from [facility] center and was recommended to have palliative care services. Denise from [employer] is wanting a verbal ok for this. [phone] opt 2

## 2020-05-04 NOTE — Telephone Encounter (Signed)
Spoke with patient's daughter, Jannet Mantis, regarding the Palliative referral and she said that she had requested our services when patient was in [facility].  We have scheduled a Palliative Telehealth Zoom Consult for 05/09/20 @ 8:30 AM.

## 2020-05-04 NOTE — Telephone Encounter (Signed)
Yes, please go ahead and give the verbal okay for this consultation for palliative care services.  Thanks.

## 2020-05-07 ENCOUNTER — Other Ambulatory Visit: Payer: Self-pay

## 2020-05-07 ENCOUNTER — Encounter (HOSPITAL_COMMUNITY): Payer: Self-pay | Admitting: *Deleted

## 2020-05-07 ENCOUNTER — Emergency Department (HOSPITAL_COMMUNITY): Payer: Medicare PPO

## 2020-05-07 ENCOUNTER — Inpatient Hospital Stay (HOSPITAL_COMMUNITY)
Admission: EM | Admit: 2020-05-07 | Discharge: 2020-05-10 | DRG: 682 | Disposition: A | Discharge status: Hospice | Payer: Medicare PPO | Attending: Internal Medicine | Admitting: Internal Medicine

## 2020-05-07 DIAGNOSIS — N390 Urinary tract infection, site not specified: Secondary | ICD-10-CM | POA: Diagnosis present

## 2020-05-07 DIAGNOSIS — R531 Weakness: Secondary | ICD-10-CM | POA: Diagnosis not present

## 2020-05-07 DIAGNOSIS — K7460 Unspecified cirrhosis of liver: Secondary | ICD-10-CM | POA: Diagnosis present

## 2020-05-07 DIAGNOSIS — E43 Unspecified severe protein-calorie malnutrition: Secondary | ICD-10-CM | POA: Diagnosis present

## 2020-05-07 DIAGNOSIS — Z6825 Body mass index (BMI) 25.0-25.9, adult: Secondary | ICD-10-CM

## 2020-05-07 DIAGNOSIS — Z79899 Other long term (current) drug therapy: Secondary | ICD-10-CM | POA: Diagnosis not present

## 2020-05-07 DIAGNOSIS — E162 Hypoglycemia, unspecified: Secondary | ICD-10-CM | POA: Diagnosis not present

## 2020-05-07 DIAGNOSIS — Z79890 Hormone replacement therapy (postmenopausal): Secondary | ICD-10-CM

## 2020-05-07 DIAGNOSIS — Z20822 Contact with and (suspected) exposure to covid-19: Secondary | ICD-10-CM | POA: Diagnosis present

## 2020-05-07 DIAGNOSIS — E875 Hyperkalemia: Secondary | ICD-10-CM | POA: Diagnosis present

## 2020-05-07 DIAGNOSIS — Z515 Encounter for palliative care: Secondary | ICD-10-CM | POA: Diagnosis not present

## 2020-05-07 DIAGNOSIS — Z87891 Personal history of nicotine dependence: Secondary | ICD-10-CM | POA: Diagnosis not present

## 2020-05-07 DIAGNOSIS — E039 Hypothyroidism, unspecified: Secondary | ICD-10-CM | POA: Diagnosis present

## 2020-05-07 DIAGNOSIS — Z8551 Personal history of malignant neoplasm of bladder: Secondary | ICD-10-CM

## 2020-05-07 DIAGNOSIS — R627 Adult failure to thrive: Secondary | ICD-10-CM | POA: Diagnosis present

## 2020-05-07 DIAGNOSIS — Z66 Do not resuscitate: Secondary | ICD-10-CM | POA: Diagnosis present

## 2020-05-07 DIAGNOSIS — N179 Acute kidney failure, unspecified: Secondary | ICD-10-CM | POA: Diagnosis present

## 2020-05-07 DIAGNOSIS — R188 Other ascites: Secondary | ICD-10-CM | POA: Diagnosis present

## 2020-05-07 DIAGNOSIS — N170 Acute kidney failure with tubular necrosis: Secondary | ICD-10-CM | POA: Diagnosis not present

## 2020-05-07 DIAGNOSIS — Z888 Allergy status to other drugs, medicaments and biological substances status: Secondary | ICD-10-CM | POA: Diagnosis not present

## 2020-05-07 DIAGNOSIS — E860 Dehydration: Secondary | ICD-10-CM | POA: Diagnosis present

## 2020-05-07 DIAGNOSIS — E559 Vitamin D deficiency, unspecified: Secondary | ICD-10-CM | POA: Diagnosis present

## 2020-05-07 DIAGNOSIS — L8990 Pressure ulcer of unspecified site, unspecified stage: Secondary | ICD-10-CM | POA: Diagnosis present

## 2020-05-07 DIAGNOSIS — D696 Thrombocytopenia, unspecified: Secondary | ICD-10-CM | POA: Diagnosis present

## 2020-05-07 DIAGNOSIS — K7581 Nonalcoholic steatohepatitis (NASH): Secondary | ICD-10-CM | POA: Diagnosis present

## 2020-05-07 DIAGNOSIS — Z8249 Family history of ischemic heart disease and other diseases of the circulatory system: Secondary | ICD-10-CM | POA: Diagnosis not present

## 2020-05-07 DIAGNOSIS — I5032 Chronic diastolic (congestive) heart failure: Secondary | ICD-10-CM | POA: Diagnosis present

## 2020-05-07 DIAGNOSIS — L89152 Pressure ulcer of sacral region, stage 2: Secondary | ICD-10-CM | POA: Diagnosis present

## 2020-05-07 DIAGNOSIS — E46 Unspecified protein-calorie malnutrition: Secondary | ICD-10-CM | POA: Diagnosis present

## 2020-05-07 LAB — COMPREHENSIVE METABOLIC PANEL
ALT: 24 U/L (ref 0–44)
AST: 52 U/L — ABNORMAL HIGH (ref 15–41)
Albumin: 1.8 g/dL — ABNORMAL LOW (ref 3.5–5.0)
Alkaline Phosphatase: 136 U/L — ABNORMAL HIGH (ref 38–126)
Anion gap: 8 (ref 5–15)
BUN: 54 mg/dL — ABNORMAL HIGH (ref 8–23)
CO2: 21 mmol/L — ABNORMAL LOW (ref 22–32)
Calcium: 8.3 mg/dL — ABNORMAL LOW (ref 8.9–10.3)
Chloride: 109 mmol/L (ref 98–111)
Creatinine, Ser: 2.51 mg/dL — ABNORMAL HIGH (ref 0.61–1.24)
GFR, Estimated: 24 mL/min — ABNORMAL LOW (ref >60)
Glucose, Bld: 80 mg/dL (ref 70–99)
Potassium: 5 mmol/L (ref 3.5–5.1)
Sodium: 138 mmol/L (ref 135–145)
Total Bilirubin: 2.5 mg/dL — ABNORMAL HIGH (ref 0.3–1.2)
Total Protein: 5.9 g/dL — ABNORMAL LOW (ref 6.5–8.1)

## 2020-05-07 LAB — URINALYSIS, ROUTINE W REFLEX MICROSCOPIC
Glucose, UA: 50 mg/dL — AB
Ketones, ur: 5 mg/dL — AB
Nitrite: NEGATIVE
Protein, ur: 30 mg/dL — AB
RBC / HPF: >50 RBC/hpf — ABNORMAL HIGH (ref 0–5)
Specific Gravity, Urine: 1.017 (ref 1.005–1.030)
pH: 5 (ref 5.0–8.0)

## 2020-05-07 LAB — CBG MONITORING, ED
Glucose-Capillary: 54 mg/dL — ABNORMAL LOW (ref 70–99)
Glucose-Capillary: 57 mg/dL — ABNORMAL LOW (ref 70–99)
Glucose-Capillary: 81 mg/dL (ref 70–99)
Glucose-Capillary: 85 mg/dL (ref 70–99)
Glucose-Capillary: 81 mg/dL (ref 70–99)
Glucose-Capillary: 90 mg/dL (ref 70–99)

## 2020-05-07 LAB — CBC WITH DIFFERENTIAL/PLATELET
Abs Immature Granulocytes: 0.03 10*3/uL (ref 0.00–0.07)
Basophils Absolute: 0.1 10*3/uL (ref 0.0–0.1)
Basophils Relative: 1 %
Eosinophils Absolute: 0.4 10*3/uL (ref 0.0–0.5)
Eosinophils Relative: 5 %
HCT: 35.7 % — ABNORMAL LOW (ref 39.0–52.0)
Hemoglobin: 11.7 g/dL — ABNORMAL LOW (ref 13.0–17.0)
Immature Granulocytes: 0 %
Lymphocytes Relative: 18 %
Lymphs Abs: 1.3 10*3/uL (ref 0.7–4.0)
MCH: 33.1 pg (ref 26.0–34.0)
MCHC: 32.8 g/dL (ref 30.0–36.0)
MCV: 101.1 fL — ABNORMAL HIGH (ref 80.0–100.0)
Monocytes Absolute: 0.4 10*3/uL (ref 0.1–1.0)
Monocytes Relative: 5 %
Neutro Abs: 5.4 10*3/uL (ref 1.7–7.7)
Neutrophils Relative %: 71 %
Platelets: 112 10*3/uL — ABNORMAL LOW (ref 150–400)
RBC: 3.53 MIL/uL — ABNORMAL LOW (ref 4.22–5.81)
RDW: 15 % (ref 11.5–15.5)
WBC: 7.6 10*3/uL (ref 4.0–10.5)
nRBC: 0 % (ref 0.0–0.2)

## 2020-05-07 LAB — BRAIN NATRIURETIC PEPTIDE: B Natriuretic Peptide: 214 pg/mL — ABNORMAL HIGH (ref 0.0–100.0)

## 2020-05-07 LAB — AMMONIA: Ammonia: 31 umol/L (ref 9–35)

## 2020-05-07 MED ORDER — POLYETHYLENE GLYCOL 3350 17 G PO PACK: 17.0000 g | PACK | Freq: Every day | ORAL | Status: DC | PRN

## 2020-05-07 MED ORDER — SODIUM CHLORIDE 0.9 % IV BOLUS
1000.0000 mL | Freq: Once | INTRAVENOUS | Status: AC
  Administered 2020-05-07: 1000 mL via INTRAVENOUS

## 2020-05-07 MED ORDER — ONDANSETRON HCL 4 MG PO TABS: 4.0000 mg | ORAL_TABLET | Freq: Four times a day (QID) | ORAL | Status: DC | PRN

## 2020-05-07 MED ORDER — ALBUTEROL SULFATE HFA 108 (90 BASE) MCG/ACT IN AERS: 2.0000 | INHALATION_SPRAY | RESPIRATORY_TRACT | Status: DC | PRN

## 2020-05-07 MED ORDER — LEVOTHYROXINE SODIUM 50 MCG PO TABS
50.0000 ug | ORAL_TABLET | Freq: Every day | ORAL | Status: DC
  Administered 2020-05-08 – 2020-05-09 (×2): 50 ug via ORAL
  Filled 2020-05-07 (×2): qty 1

## 2020-05-07 MED ORDER — BRIMONIDINE TARTRATE 0.15 % OP SOLN
1.0000 [drp] | Freq: Two times a day (BID) | OPHTHALMIC | Status: DC
  Administered 2020-05-08 – 2020-05-10 (×6): 1 [drp] via OPHTHALMIC
  Filled 2020-05-07: qty 5

## 2020-05-07 MED ORDER — LACTULOSE 10 GM/15ML PO SOLN
10.0000 g | Freq: Every day | ORAL | Status: DC
  Filled 2020-05-07: qty 30

## 2020-05-07 MED ORDER — ENSURE ENLIVE PO LIQD: 237.0000 mL | Freq: Two times a day (BID) | ORAL | Status: DC

## 2020-05-07 MED ORDER — PROSOURCE NO CARB PO LIQD
30.0000 mL | Freq: Three times a day (TID) | ORAL | Status: DC
  Filled 2020-05-07 (×8): qty 30

## 2020-05-07 MED ORDER — KETOTIFEN FUMARATE 0.025 % OP SOLN
1.0000 [drp] | Freq: Two times a day (BID) | OPHTHALMIC | Status: DC
  Administered 2020-05-08 – 2020-05-10 (×6): 1 [drp] via OPHTHALMIC
  Filled 2020-05-07: qty 5

## 2020-05-07 MED ORDER — ALLOPURINOL 100 MG PO TABS
100.0000 mg | ORAL_TABLET | Freq: Every day | ORAL | Status: DC
  Administered 2020-05-08 – 2020-05-10 (×3): 100 mg via ORAL
  Filled 2020-05-07 (×3): qty 1

## 2020-05-07 MED ORDER — VENELEX EX OINT: 1.0000 "application " | TOPICAL_OINTMENT | Freq: Three times a day (TID) | CUTANEOUS | Status: DC

## 2020-05-07 MED ORDER — ONDANSETRON HCL 4 MG/2ML IJ SOLN: 4.0000 mg | Freq: Four times a day (QID) | INTRAMUSCULAR | Status: DC | PRN

## 2020-05-07 MED ORDER — LATANOPROST 0.005 % OP SOLN
1.0000 [drp] | Freq: Every day | OPHTHALMIC | Status: DC
  Administered 2020-05-08 – 2020-05-09 (×3): 1 [drp] via OPHTHALMIC
  Filled 2020-05-07: qty 2.5

## 2020-05-07 MED ORDER — ENOXAPARIN SODIUM 30 MG/0.3ML ~~LOC~~ SOLN
30.0000 mg | SUBCUTANEOUS | Status: DC
  Administered 2020-05-08 – 2020-05-09 (×3): 30 mg via SUBCUTANEOUS
  Filled 2020-05-07 (×3): qty 0.3

## 2020-05-07 MED ORDER — SODIUM CHLORIDE 0.9 % IV SOLN: INTRAVENOUS | Status: DC

## 2020-05-07 NOTE — ED Triage Notes (Signed)
Pt brought in by [facility] from home with initial c/o of SOB. When EMS arrived, pt denied any SOB or any complaints and pt did not appear to be in any distress. EMS reports that pt has had little to no oral intake since he was discharged from [facility] 1 weeks ago. CBG 63 for EMS, but IV was unable to be obtained. Pt's CBG 54 upon arrival. Pt is alert but appears weak. Pt given orange juice upon ED arrival for hypoglycemia. Pt occasionally coughing with the PO intake. Pt's diaper is soiled with stool upon arrival.

## 2020-05-07 NOTE — ED Provider Notes (Signed)
[facility] EMERGENCY DEPARTMENT Provider Note   CSN: [id number] Arrival date & time: 05/07/20  1115     History Chief Complaint  Patient presents with  . Weakness    Gabriel Spencer is a 87 y.o. male.  HPI Patient presenting for evaluation of weakness, and hypoglycemia.  EMS was called to his home for "shortness of breath."  On arrival he was not having trouble breathing had normal oxygen saturation.  He appeared debilitated and weak, and as if he had not been eating and drinking.  EMS had to spend about 90 minutes convincing him to come to the hospital for evaluation.  He was discharged from a nursing care facility on 04/29/2020, after a hospitalization for congestive heart failure.  He had been admitted there for short-term rehab.  It was felt that he would need ongoing management in the home setting, and this was arranged with home health.  By report of EMS, family members state that since arriving home a week ago, he has been in his bedroom, and not able to do much.  They were not here on arrival and he is unable to give more specific history.  Level 5 caveat-poor historian    Past Medical History:  Diagnosis Date  . Acute lower UTI 01/10/2019  . Arthritis   . Chronic diastolic (congestive) heart failure ([place])   . Cirrhosis of liver ([place])   . Glaucoma   . Gout   . Macular degeneration   . Thyroid disease   . Vitamin D deficiency     Patient Active Problem List   Diagnosis Date Noted  . Macrocytic anemia 04/20/2020  . Acute respiratory failure with hypoxia ([place]) 04/10/2020  . Aspiration pneumonia ([place]) 04/10/2020  . Normochromic normocytic anemia 04/07/2020  . Encephalopathy 04/07/2020  . Chronic idiopathic gout of multiple sites 04/04/2020  . Cirrhosis of liver with ascites ([place]) 04/04/2020  . BPV (benign positional vertigo) 04/04/2020  . Thrombocytopenia ([place]) 04/04/2020  . Protein-calorie malnutrition, severe ([place]) 04/04/2020  . Pressure injury of skin 03/30/2020  .  Glaucoma 03/30/2020  . Overweight (BMI 25.0-29.9) 03/30/2020  . SOB (shortness of breath) 03/29/2020  . Hypothyroidism 03/29/2020  . Generalized weakness 03/29/2020  . Edema of both legs 03/29/2020  . Pulmonary edema 03/29/2020  . Chronic diastolic CHF (congestive heart failure) ([place]) 03/29/2020  . Acute on chronic diastolic CHF (congestive heart failure) ([place]) 03/29/2020  . LFT elevation   . Syncope 01/10/2019  . History of bladder cancer, 2016 03/10/2015    Past Surgical History:  Procedure Laterality Date  . BLADDER SURGERY    . CATARACT EXTRACTION, BILATERAL    . CHOLECYSTECTOMY  11/30/2011   Procedure: LAPAROSCOPIC CHOLECYSTECTOMY;  Surgeon: Jamesetta So, MD;  Location: [facility];  Service: General;  Laterality: N/A;  . CYSTOSCOPY/RETROGRADE/URETEROSCOPY Bilateral 03/10/2015   Procedure: Consuela Mimes BILATERAL Concha Se;  Surgeon: Irine Seal, MD;  Location: [facility];  Service: Urology;  Laterality: Bilateral;  . LYMPH NODE BIOPSY  1975   right  . RETINAL DETACHMENT SURGERY     right eye  . TRANSURETHRAL RESECTION OF BLADDER TUMOR Bilateral 03/10/2015   Procedure: TRANSURETHRAL RESECTION OF BLADDER TUMOR (TURBT) INSTILLATION WITH MITOMYCIN C IN RECOVERY;  Surgeon: Irine Seal, MD;  Location: [facility];  Service: Urology;  Laterality: Bilateral;  . TYMPANOPLASTY     right       Family History  Problem Relation Age of Onset  . Hypertension Father     Social History   Tobacco Use  .  Smoking status: Former Smoker    Quit date: 03/27/1983    Years since quitting: 37.1  . Smokeless tobacco: Never Used  Vaping Use  . Vaping Use: Never used  Substance Use Topics  . Alcohol use: No  . Drug use: No    Home Medications Prior to Admission medications   Medication Sig Start Date End Date Taking? Authorizing Provider  albuterol (VENTOLIN HFA) 108 (90 Base) MCG/ACT inhaler Inhale 2 puffs into the lungs every 4 (four) hours as needed for wheezing or shortness of breath. 04/29/20    Gerlene Fee, NP  allopurinol (ZYLOPRIM) 100 MG tablet Take 1 tablet (100 mg total) by mouth daily. 04/29/20   Gerlene Fee, NP  Amino Acids-Protein Hydrolys (FEEDING SUPPLEMENT, PRO-STAT SUGAR FREE 64,) LIQD Take 30 mLs by mouth 3 (three) times daily with meals. for albumin 2.3    [provider]  Janne Lab Oil [facility]) OINT Apply topically in the morning, at noon, and at bedtime. Special Instructions: Apply to sacrum, coccyx and bilateral buttocks qshift for prevention. 04/02/20   [provider]  brimonidine (ALPHAGAN) 0.15 % ophthalmic solution Place 1 drop into both eyes 2 (two) times daily. 04/29/20   Gerlene Fee, NP  cholecalciferol (VITAMIN D3) 10 MCG (400 UNIT) TABS tablet Take 400 Units by mouth daily. 04/01/20   [provider]  feeding supplement (ENSURE ENLIVE / ENSURE PLUS) LIQD Take 237 mLs by mouth 2 (two) times daily between meals. 04/14/20   Johnson, Clanford L, MD  ketotifen (ZADITOR) 0.025 % ophthalmic solution Place 1 drop into both eyes 2 (two) times daily. 04/29/20   Gerlene Fee, NP  lactulose (CHRONULAC) 10 GM/15ML solution Take 15 mLs (10 g total) by mouth daily. for ammonia level of 50 04/29/20   Gerlene Fee, NP  latanoprost (XALATAN) 0.005 % ophthalmic solution Place 1 drop into both eyes at bedtime. 04/29/20   Gerlene Fee, NP  levothyroxine (SYNTHROID) 50 MCG tablet Take 1 tablet (50 mcg total) by mouth daily. 04/29/20   Gerlene Fee, NP  Multiple Vitamins-Minerals (PRESERVISION AREDS PO) Take 1 tablet by mouth 2 (two) times daily.    [provider]  NON FORMULARY Diet Change: Dys 3 (mechanical soft), thin liquids (NO STRAW); NAS 04/14/20   [provider]    Allergies    Cephalexin  Review of Systems   Review of Systems  All other systems reviewed and are negative.   Physical Exam Updated Vital Signs BP (!) 130/53   Pulse 72   Temp (!) 97.3 F (36.3 C) (Axillary)   Resp 18   Ht 6' (1.829  m)   Wt 84.8 kg   SpO2 98%   BMI 25.36 kg/m   Physical Exam Vitals and nursing note reviewed.  Constitutional:      General: He is in acute distress.     Appearance: He is well-developed and well-nourished. He is ill-appearing. He is not toxic-appearing or diaphoretic.  HENT:     Head: Normocephalic and atraumatic.     Right Ear: External ear normal.     Left Ear: External ear normal.     Mouth/Throat:     Mouth: Mucous membranes are dry.     Pharynx: No oropharyngeal exudate or posterior oropharyngeal erythema.  Eyes:     Extraocular Movements: EOM normal.     Conjunctiva/sclera: Conjunctivae normal.     Pupils: Pupils are equal, round, and reactive to light.  Neck:  Trachea: Phonation normal.  Cardiovascular:     Rate and Rhythm: Normal rate and regular rhythm.     Heart sounds: Normal heart sounds.  Pulmonary:     Effort: Pulmonary effort is normal.     Breath sounds: Normal breath sounds.  Chest:     Chest wall: No bony tenderness.  Abdominal:     Palpations: Abdomen is soft.     Tenderness: There is no abdominal tenderness.  Musculoskeletal:        General: Normal range of motion.     Cervical back: Normal range of motion and neck supple.  Skin:    General: Skin is warm, dry and intact.  Neurological:     Mental Status: He is alert and oriented to person, place, and time.     Cranial Nerves: No cranial nerve deficit.     Sensory: No sensory deficit.     Motor: No abnormal muscle tone.     Coordination: Coordination normal.     Comments: Responsive, cooperative.  No dysarthria.  No clear evidence for aphasia.  Psychiatric:        Mood and Affect: Mood and affect and mood normal.        Behavior: Behavior normal.        Thought Content: Thought content normal.        Judgment: Judgment normal.     ED Results / Procedures / Treatments   Labs (all labs ordered are listed, but only abnormal results are displayed) Labs Reviewed  COMPREHENSIVE METABOLIC  PANEL - Abnormal; Notable for the following components:      Result Value   CO2 21 (*)    BUN 54 (*)    Creatinine, Ser 2.51 (*)    Calcium 8.3 (*)    Total Protein 5.9 (*)    Albumin 1.8 (*)    AST 52 (*)    Alkaline Phosphatase 136 (*)    Total Bilirubin 2.5 (*)    GFR, Estimated 24 (*)    All other components within normal limits  CBC WITH DIFFERENTIAL/PLATELET - Abnormal; Notable for the following components:   RBC 3.53 (*)    Hemoglobin 11.7 (*)    HCT 35.7 (*)    MCV 101.1 (*)    Platelets 112 (*)    All other components within normal limits  CBG MONITORING, ED - Abnormal; Notable for the following components:   Glucose-Capillary 54 (*)    All other components within normal limits  CBG MONITORING, ED - Abnormal; Notable for the following components:   Glucose-Capillary 57 (*)    All other components within normal limits  SARS CORONAVIRUS 2 (TAT 6-24 HRS)  URINALYSIS, ROUTINE W REFLEX MICROSCOPIC  AMMONIA  BRAIN NATRIURETIC PEPTIDE  CBG MONITORING, ED  CBG MONITORING, ED    EKG EKG Interpretation  Date/Time:  Saturday May 07 2020 11:53:40 EST Ventricular Rate:  82 PR Interval:    QRS Duration: 110 QT Interval:  422 QTC Calculation: 493 R Axis:   -18 Text Interpretation: Atrial fibrillation Incomplete left bundle branch block Low voltage, extremity and precordial leads Borderline prolonged QT interval Since last tracing Atrial fibrillation is now Present Otherwise no significant change Confirmed by Daleen Bo [phone]) on 05/07/2020 11:58:22 AM   Radiology DG Chest Port 1 View  Result Date: 05/07/2020 CLINICAL DATA:  Shortness of breath EXAM: PORTABLE CHEST 1 VIEW COMPARISON:  April 11, 2020 FINDINGS: There is a small right pleural effusion with airspace opacity in the lateral right  base. There is atelectatic change in the left base. Heart size and pulmonary vascularity are within normal limits. No adenopathy. No bone lesions. IMPRESSION: Right pleural  effusion. Airspace opacity lateral right base concerning for pneumonia. Atelectasis left base. Stable cardiac silhouette. Electronically Signed   By: Lowella Grip III M.D.   On: 05/07/2020 13:01    Procedures .Critical Care Performed by: Daleen Bo, MD Authorized by: Daleen Bo, MD   Critical care provider statement:    Critical care time (minutes):  35   Critical care start time:  05/07/2020 11:15 AM   Critical care end time:  05/07/2020 2:57 PM   Critical care time was exclusive of:  Separately billable procedures and treating other patients   Critical care was necessary to treat or prevent imminent or life-threatening deterioration of the following conditions:  Metabolic crisis   Critical care was time spent personally by me on the following activities:  Blood draw for specimens, development of treatment plan with patient or surrogate, discussions with consultants, evaluation of patient's response to treatment, examination of patient, obtaining history from patient or surrogate, ordering and performing treatments and interventions, ordering and review of laboratory studies, pulse oximetry, re-evaluation of patient's condition, review of old charts and ordering and review of radiographic studies     Medications Ordered in ED Medications  0.9 %  sodium chloride infusion (has no administration in time range)  sodium chloride 0.9 % bolus 1,000 mL (0 mLs Intravenous Stopped 05/07/20 1428)    ED Course  I have reviewed the triage vital signs and the nursing notes.  Pertinent labs & imaging results that were available during my care of the patient were reviewed by me and considered in my medical decision making (see chart for details).  Clinical Course as of 05/07/20 1459  Sat May 07, 2020  1432 I was able to reach the patient's daughter, Manuela Schwartz, who is aware of the patient's current status.  Since discharge from the rehab facility, his only medications are lactulose, Synthroid,  eyedrops and allopurinol.  He is not currently receiving diuretics.  She reports that he is eating and drinking less, but not clear why that is.  He has been unable to get out of bed and walking yesterday physical therapy came to see him for the first time since discharge from rehab.  He did not get up and walk with them.  They were concerned about a sore on his buttocks and put a wound dressing on it.  Manuela Schwartz states that the patient is full code at this time, but acknowledges that a palliative care consult is planned for 05/10/2019.  The patient requires hospitalization, today, for AKI.  Anticipate palliative care consult and reassessment of active needs at this time. [EW]    Clinical Course User Index [EW] Daleen Bo, MD   MDM Rules/Calculators/A&P                           Patient Vitals for the past 24 hrs:  BP Temp Temp src Pulse Resp SpO2 Height Weight  05/07/20 1330 (!) 130/53 -- -- 72 18 98 % -- --  05/07/20 1300 (!) 117/44 -- -- -- 18 -- -- --  05/07/20 1230 (!) 118/50 -- -- 78 20 97 % -- --  05/07/20 1200 (!) 108/43 -- -- 80 (!) 21 97 % -- --  05/07/20 1145 (!) 109/41 (!) 97.3 F (36.3 C) Axillary 82 18 94 % -- --  05/07/20 1142 -- -- -- -- -- -- 6' (1.829 m) 84.8 kg    2:37 PM Reevaluation with update and discussion. After initial assessment and treatment, an updated evaluation reveals patient awake and alert and responsive.  He reports that he has not been eating and drinking as usual because he is not hungry or thirsty.  He acknowledges that he saw a physical therapist at home.  He is aware that he was recently discharged from rehab.  Findings discussed with patient and family members by telephone.  Everyone is agreeable to hospitalization. Daleen Bo   Medical Decision Making:  This patient is presenting for evaluation of weakness following recent short-term rehab stay, which does require a range of treatment options, and is a complaint that involves a moderate risk of  morbidity and mortality. The differential diagnoses include acute infection, metabolic disorder, failure to thrive. I decided to review old records, and in summary elderly male, at home a week, not eating and drinking well and unable to get out of his bed.  He was hypoglycemic for emergency medical services, during their evaluation.  I obtained additional historical information from daughter Manuela Schwartz, by telephone.  Clinical Laboratory Tests Ordered, included CBG monitoring, CBC, Metabolic panel, Urinalysis and Covid test, ammonia level, BMP. Review indicates hypoglycemia, AKI, elevated MCV, hemoglobin low, BUN and creatinine elevated, calcium low, total protein low, albumin low, AST high, alkaline phosphatase high, total bilirubin high. Radiologic Tests Ordered, included chest x-ray.  I independently Visualized: Radiograph images, which show possibly progressive right lower lung effusion versus infiltrate   Critical Interventions-clinical evaluation, laboratory testing, radiologic imaging, observation and reassessment.  After These Interventions, the Patient was reevaluated and was found with AKI, and hypoglycemia, requiring hospitalization for stabilization.  Suspect poor intake.  Patient due to have palliative care consultation on 05/09/2020.  At this point he will be hospitalized for stabilization of treatment.  He is not currently on diuretics, and has apparently been having complications of congestive heart failure with recent fluid overload, as well as dehydration.  Chest x-ray abnormalities nonspecific.  No fever or white count to be concerned about pneumonia. Patient with low body temperature on arrival, likely secondary to hyperglycemia, doubt sepsis. Patient has had multiple hospitalizations within the last 5 weeks as well as a inpatient rehab stay for 30 days during this time. He has had both fluid overload, and dehydration, and been difficult to manage. He has required oxygen at times and not at  others. He has cirrhosis has been relatively stable, and he has not been frankly encephalopathic. He appears to be generally worsening and will likely benefit from palliative care consultation for establishment of new parameters for treatment.  CRITICAL CARE-yes Performed by: Daleen Bo  Nursing Notes Reviewed/ Care Coordinated Applicable Imaging Reviewed Interpretation of Laboratory Data incorporated into ED treatment  2:56 PM-Consult complete with hospitalist. Patient case explained and discussed.  He agrees to admit patient for further evaluation and treatment. Call ended at 3:43 PM    Final Clinical Impression(s) / ED Diagnoses Final diagnoses:  AKI (acute kidney injury) ([place])  Hypoglycemia    Rx / DC Orders ED Discharge Orders    None       Daleen Bo, MD 05/07/20 1545

## 2020-05-07 NOTE — H&P (Signed)
History and Physical  Gabriel Spencer JEH:[id number] DOB: 1931/07/12 DOA: 05/07/2020  Referring physician: Dr Gabriel Spencer, ED physician PCP: Gabriel Albee, MD  Outpatient Specialists:   Patient Coming From: home  Chief Complaint: Weakness, not eating or drinking  HPI: Gabriel Spencer is a 87 y.o. male with a history of cirrhosis of liver, chronic diastolic heart failure, thyroid disease, protein calorie malnutrition.  Patient was recently hospitalized due to aspiration pneumonia and respiratory failure.  The patient was discharged to a SNF and was recently sent home.  At home, the patient has been not eating or drinking because of lack of appetite.  He states that he wants to eat, but just does not have the appetite to do so.  He was brought to the hospital by the family.  No palliating or provoking factors.  Symptoms are worsening.  Emergency Department Course: Creatinine 2.51, BNP 214, bilirubin 2.5 with ammonia level 31.  Albumin is 1.8 and protein 5.9.  White count normal  Review of Systems:   Pt denies any fevers, chills, nausea, vomiting, diarrhea, constipation, abdominal pain, shortness of breath, dyspnea on exertion, orthopnea, cough, wheezing, palpitations, headache, vision changes, lightheadedness, dizziness, melena, rectal bleeding.  Review of systems are otherwise negative  Past Medical History:  Diagnosis Date  . Acute lower UTI 01/10/2019  . Arthritis   . Chronic diastolic (congestive) heart failure ([place])   . Cirrhosis of liver ([place])   . Glaucoma   . Gout   . Macular degeneration   . Thyroid disease   . Vitamin D deficiency    Past Surgical History:  Procedure Laterality Date  . BLADDER SURGERY    . CATARACT EXTRACTION, BILATERAL    . CHOLECYSTECTOMY  11/30/2011   Procedure: LAPAROSCOPIC CHOLECYSTECTOMY;  Surgeon: Jamesetta So, MD;  Location: [facility];  Service: General;  Laterality: N/A;  . CYSTOSCOPY/RETROGRADE/URETEROSCOPY Bilateral 03/10/2015   Procedure: Consuela Mimes  BILATERAL Concha Se;  Surgeon: Irine Seal, MD;  Location: [facility];  Service: Urology;  Laterality: Bilateral;  . LYMPH NODE BIOPSY  1975   right  . RETINAL DETACHMENT SURGERY     right eye  . TRANSURETHRAL RESECTION OF BLADDER TUMOR Bilateral 03/10/2015   Procedure: TRANSURETHRAL RESECTION OF BLADDER TUMOR (TURBT) INSTILLATION WITH MITOMYCIN C IN RECOVERY;  Surgeon: Irine Seal, MD;  Location: [facility];  Service: Urology;  Laterality: Bilateral;  . TYMPANOPLASTY     right   Social History:  reports that he quit smoking about 37 years ago. He has never used smokeless tobacco. He reports that he does not drink alcohol and does not use drugs. Patient lives at home with family members  Allergies  Allergen Reactions  . Cephalexin Hives    Family History  Problem Relation Age of Onset  . Hypertension Father       Prior to Admission medications   Medication Sig Start Date End Date Taking? Authorizing Provider  albuterol (VENTOLIN HFA) 108 (90 Base) MCG/ACT inhaler Inhale 2 puffs into the lungs every 4 (four) hours as needed for wheezing or shortness of breath. 04/29/20  Yes Gerlene Fee, NP  allopurinol (ZYLOPRIM) 100 MG tablet Take 1 tablet (100 mg total) by mouth daily. 04/29/20  Yes Gerlene Fee, NP  brimonidine (ALPHAGAN) 0.15 % ophthalmic solution Place 1 drop into both eyes 2 (two) times daily. 04/29/20  Yes Gerlene Fee, NP  cholecalciferol (VITAMIN D3) 10 MCG (400 UNIT) TABS tablet Take 400 Units by mouth daily. 04/01/20  Yes [provider]  ketotifen (ZADITOR) 0.025 % ophthalmic solution Place 1 drop into both eyes 2 (two) times daily. 04/29/20  Yes Gerlene Fee, NP  lactulose (CHRONULAC) 10 GM/15ML solution Take 15 mLs (10 g total) by mouth daily. for ammonia level of 50 04/29/20  Yes Gerlene Fee, NP  latanoprost (XALATAN) 0.005 % ophthalmic solution Place 1 drop into both eyes at bedtime. 04/29/20  Yes Gerlene Fee, NP  levothyroxine (SYNTHROID) 50 MCG tablet  Take 1 tablet (50 mcg total) by mouth daily. 04/29/20  Yes Gerlene Fee, NP  Amino Acids-Protein Hydrolys (FEEDING SUPPLEMENT, PRO-STAT SUGAR FREE 64,) LIQD Take 30 mLs by mouth 3 (three) times daily with meals. for albumin 2.3    [provider]  Janne Lab [facility]) OINT Apply topically in the morning, at noon, and at bedtime. Special Instructions: Apply to sacrum, coccyx and bilateral buttocks qshift for prevention. 04/02/20   [provider]  feeding supplement (ENSURE ENLIVE / ENSURE PLUS) LIQD Take 237 mLs by mouth 2 (two) times daily between meals. 04/14/20   Johnson, Clanford L, MD  Multiple Vitamins-Minerals (PRESERVISION AREDS PO) Take 1 tablet by mouth 2 (two) times daily.    [provider]  NON FORMULARY Diet Change: Dys 3 (mechanical soft), thin liquids (NO STRAW); NAS 04/14/20   [provider]    Physical Exam: BP (!) 118/49   Pulse 71   Temp (!) 97.3 F (36.3 C) (Axillary)   Resp (!) 26   Ht 6' (1.829 m)   Wt 84.8 kg   SpO2 94%   BMI 25.36 kg/m   . General: Elderly male. Awake and alert and oriented x3.  Weak and cachectic in appearance No acute cardiopulmonary distress.  Marland Kitchen HEENT: Normocephalic atraumatic.  Right and left ears normal in appearance.  Pupils equal, round, reactive to light. Extraocular muscles are intact. Sclerae anicteric and noninjected.  Moist mucosal membranes. No mucosal lesions.  . Neck: Neck supple without lymphadenopathy. No carotid bruits. No masses palpated.  . Cardiovascular: Regular rate with normal S1-S2 sounds. No murmurs, rubs, gallops auscultated. No JVD.  Marland Kitchen Respiratory: Good respiratory effort with no wheezes, rales, rhonchi. Lungs clear to auscultation bilaterally.  No accessory muscle use. . Abdomen: Soft, nontender, nondistended. Active bowel sounds. No masses or hepatosplenomegaly  . Skin: No rashes, lesions, or ulcerations.  Dry, warm to touch. 2+ dorsalis pedis and radial  pulses. . Musculoskeletal: No calf or leg pain. All major joints not erythematous nontender.  No upper or lower joint deformation.  Good ROM.  No contractures  . Psychiatric: Intact judgment and insight. Pleasant and cooperative. . Neurologic: No focal neurological deficits. Strength is 5/5 and symmetric in upper and lower extremities.  Cranial nerves II through XII are grossly intact.           Labs on Admission: I have personally reviewed following labs and imaging studies  CBC: Recent Labs  Lab 05/07/20 1218  WBC 7.6  NEUTROABS 5.4  HGB 11.7*  HCT 35.7*  MCV 101.1*  PLT 790*   Basic Metabolic Panel: Recent Labs  Lab 05/07/20 1218  NA 138  K 5.0  CL 109  CO2 21*  GLUCOSE 80  BUN 54*  CREATININE 2.51*  CALCIUM 8.3*   GFR: Estimated Creatinine Clearance: 22.8 mL/min (A) (by C-G formula based on SCr of 2.51 mg/dL (H)). Liver Function Tests: Recent Labs  Lab 05/07/20 1218  AST 52*  ALT 24  ALKPHOS 136*  BILITOT 2.5*  PROT 5.9*  ALBUMIN 1.8*   No results for input(s): LIPASE, AMYLASE in the last 168 hours. Recent Labs  Lab 05/07/20 1507  AMMONIA 31   Coagulation Profile: No results for input(s): INR, PROTIME in the last 168 hours. Cardiac Enzymes: No results for input(s): CKTOTAL, CKMB, CKMBINDEX, TROPONINI in the last 168 hours. BNP (last 3 results) No results for input(s): PROBNP in the last 8760 hours. HbA1C: No results for input(s): HGBA1C in the last 72 hours. CBG: Recent Labs  Lab 05/07/20 1122 05/07/20 1144 05/07/20 1254 05/07/20 1406 05/07/20 1759  GLUCAP 54* 57* 81 85 81   Lipid Profile: No results for input(s): CHOL, HDL, LDLCALC, TRIG, CHOLHDL, LDLDIRECT in the last 72 hours. Thyroid Function Tests: No results for input(s): TSH, T4TOTAL, FREET4, T3FREE, THYROIDAB in the last 72 hours. Anemia Panel: No results for input(s): VITAMINB12, FOLATE, FERRITIN, TIBC, IRON, RETICCTPCT in the last 72 hours. Urine analysis:    Component Value  Date/Time   COLORURINE AMBER (A) 04/12/2020 0645   APPEARANCEUR HAZY (A) 04/12/2020 0645   LABSPEC 1.023 04/12/2020 0645   PHURINE 6.0 04/12/2020 0645   GLUCOSEU NEGATIVE 04/12/2020 0645   HGBUR LARGE (A) 04/12/2020 0645   BILIRUBINUR NEGATIVE 04/12/2020 0645   KETONESUR NEGATIVE 04/12/2020 0645   PROTEINUR 100 (A) 04/12/2020 0645   UROBILINOGEN 1.0 01/28/2015 0925   NITRITE NEGATIVE 04/12/2020 0645   LEUKOCYTESUR TRACE (A) 04/12/2020 0645   Sepsis Labs: @LABRCNTIP (procalcitonin:4,lacticidven:4) )No results found for this or any previous visit (from the past 240 hour(s)).   Radiological Exams on Admission: DG Chest Port 1 View  Result Date: 05/07/2020 CLINICAL DATA:  Shortness of breath EXAM: PORTABLE CHEST 1 VIEW COMPARISON:  April 11, 2020 FINDINGS: There is a small right pleural effusion with airspace opacity in the lateral right base. There is atelectatic change in the left base. Heart size and pulmonary vascularity are within normal limits. No adenopathy. No bone lesions. IMPRESSION: Right pleural effusion. Airspace opacity lateral right base concerning for pneumonia. Atelectasis left base. Stable cardiac silhouette. Electronically Signed   By: Lowella Grip III M.D.   On: 05/07/2020 13:01    EKG: Independently reviewed.  Sinus rhythm with incomplete bundle branch block.  Low voltage in several leads.  No acute ST changes.  Assessment/Plan: Active Problems:   Hypothyroidism   Generalized weakness   Chronic diastolic CHF (congestive heart failure) (HCC)   Pressure injury of skin   Cirrhosis of liver with ascites (HCC)   Protein-calorie malnutrition, severe (HCC)   Acute renal failure (ARF) ([place])   Dehydration   Failure to thrive in adult    This patient was discussed with the ED physician, including pertinent vitals, physical exam findings, labs, and imaging.  We also discussed care given by the ED provider.  1. Acute renal failure a. Secondary to failure to thrive  and poor intake b. Creatinine elevated, also has elevated bilirubin and LFTs consistent with dehydration c. Admit d. IV fluids e. Encourage diet f. Check creatinine tomorrow 2. Failure of thrive in adult protein calorie malnutrition, generalized weakness a. Protein shakes in between meals b. We will encourage fluid intake c. May need nutritional consult d. Palliative Care consult placed 3. Hypothyroidism a. Continue Synthroid 4. Pressure injury of skin a. Continue barrier creams 5. Cirrhosis of liver a. No evidence of encephalopathy. b. Ammonia control c. Continue lactulose 6. Chronic diastolic heart failure a. Compensated b. We will watch fluid volume  DVT prophylaxis: Lovenox Consultants: Palliative care Code Status: Full code for now  Family Communication: None Disposition Plan: Pending   Truett Mainland, DO

## 2020-05-08 ENCOUNTER — Inpatient Hospital Stay (HOSPITAL_COMMUNITY): Payer: Medicare PPO

## 2020-05-08 DIAGNOSIS — R627 Adult failure to thrive: Secondary | ICD-10-CM

## 2020-05-08 DIAGNOSIS — R188 Other ascites: Secondary | ICD-10-CM

## 2020-05-08 DIAGNOSIS — I5032 Chronic diastolic (congestive) heart failure: Secondary | ICD-10-CM

## 2020-05-08 DIAGNOSIS — K7460 Unspecified cirrhosis of liver: Secondary | ICD-10-CM | POA: Diagnosis not present

## 2020-05-08 DIAGNOSIS — E860 Dehydration: Secondary | ICD-10-CM | POA: Diagnosis not present

## 2020-05-08 DIAGNOSIS — N179 Acute kidney failure, unspecified: Principal | ICD-10-CM

## 2020-05-08 LAB — COMPREHENSIVE METABOLIC PANEL
ALT: 23 U/L (ref 0–44)
AST: 47 U/L — ABNORMAL HIGH (ref 15–41)
Albumin: 1.7 g/dL — ABNORMAL LOW (ref 3.5–5.0)
Alkaline Phosphatase: 124 U/L (ref 38–126)
Anion gap: 7 (ref 5–15)
BUN: 55 mg/dL — ABNORMAL HIGH (ref 8–23)
CO2: 21 mmol/L — ABNORMAL LOW (ref 22–32)
Calcium: 7.9 mg/dL — ABNORMAL LOW (ref 8.9–10.3)
Chloride: 110 mmol/L (ref 98–111)
Creatinine, Ser: 2.8 mg/dL — ABNORMAL HIGH (ref 0.61–1.24)
GFR, Estimated: 21 mL/min — ABNORMAL LOW (ref >60)
Glucose, Bld: 97 mg/dL (ref 70–99)
Potassium: 4.7 mmol/L (ref 3.5–5.1)
Sodium: 138 mmol/L (ref 135–145)
Total Bilirubin: 2.1 mg/dL — ABNORMAL HIGH (ref 0.3–1.2)
Total Protein: 5.4 g/dL — ABNORMAL LOW (ref 6.5–8.1)

## 2020-05-08 LAB — URINALYSIS, COMPLETE (UACMP) WITH MICROSCOPIC
Glucose, UA: NEGATIVE mg/dL
Ketones, ur: NEGATIVE mg/dL
Nitrite: NEGATIVE
Protein, ur: 100 mg/dL — AB
RBC / HPF: >50 RBC/hpf — ABNORMAL HIGH (ref 0–5)
Specific Gravity, Urine: 1.021 (ref 1.005–1.030)
WBC, UA: >50 WBC/hpf — ABNORMAL HIGH (ref 0–5)
pH: 5 (ref 5.0–8.0)

## 2020-05-08 LAB — SARS CORONAVIRUS 2 (TAT 6-24 HRS): SARS Coronavirus 2: NEGATIVE

## 2020-05-08 MED ORDER — ALBUMIN HUMAN 25 % IV SOLN
25.0000 g | Freq: Once | INTRAVENOUS | Status: AC
  Administered 2020-05-08: 25 g via INTRAVENOUS
  Filled 2020-05-08: qty 100

## 2020-05-08 NOTE — Progress Notes (Signed)
PROGRESS NOTE  Gabriel Spencer OMV:[id number] DOB: 30-May-1931 DOA: 05/07/2020 PCP: Doree Albee, MD  Brief History:  87 year old male with a history of liver cirrhosis, diastolic CHF, hypothyroidism, gouty arthritis presenting with shortness of breath and generalized weakness.  The patient is a poor historian.  This history is obtained from review of the medical record and speaking with the patient's daughter who is an Therapist, sports.  Notably, the patient has had 2 recent hospital admissions in January.  He was first hospitalized from 03/29/2020 to 04/01/2020 for acute on chronic diastolic CHF.  He was discharged to the [facility] for STR with furosemide 20 mg daily.  Unfortunately, he was hospitalized again from 04/10/2020 to 04/13/2020 when he was transferred over to the hospital from the [facility].  He was treated for aspiration pneumonia at that time.  The patient ultimately returned home on 04/29/2020.  Unfortunately, he has continued to have worsening generalized weakness.  The patient has had poor oral intake and basically has not been able to get out of bed for about a week.  In addition, the patient had been complaining of some shortness of breath since 05/06/2020.  There is been no reports of fevers, chills, headache, chest pain, nausea, vomiting, diarrhea.  He has been taking his lactulose.  Apparently he has been having 1-2 loose bowel movements daily.  Daughter states that his mentation has been as good as usual.  She states that he has been receiving his medications, but has not been on any diuretics since discharge home on 04/29/2020.  Because of his worsening generalized weakness and failure to thrive, the patient was brought to the hospital for further evaluation. In the emergency department, the patient was afebrile hemodynamically stable with soft blood pressures in the  100s.  Oxygen saturation was 94-96% on 3 L.  BMP showed a serum creatinine of 2.51, sodium 130, potassium 5.0, and albumin 1.8.   WBC was 7.6 with hemoglobin 11.7 and platelets 112,000.  The patient was started on IV fluids.  Assessment/Plan: Acute kidney injury -Secondary to volume depletion, but concerned about hepatorenal syndrome -Renal ultrasound -Continue IV fluids  Failure to thrive -Serum G28 -Folic acid -TSH -UA 36-62 WBC -Obtain urine culture -Patient has continued to have a functional decline since November 2021  Anasarca/liver cirrhosis -Presumptively, the patient has liver cirrhosis secondary to NASH -Viral hepatitis panel in January 2020 was negative -Previous abdominal imaging suggested hepatic steatosis -His hypoalbuminemia is also contributing to anasarca -Holding diuretics temporarily in the setting of AKI  Chronic diastolic CHF -Although the patient has significant peripheral edema and anasarca, this is likely due to his liver cirrhosis and third spacing -He appears intravascularly volume depleted -03/30/2020 echo EF 70-25%, no WMA, mild elevated PASP  Thrombocytopenia -Secondary to liver cirrhosis -Monitor for signs of bleeding -Overall hemoglobin stable  Hypothyroidism -Continue Synthroid  Stage II sacral pressure injury -Local wound care -Present on admission  Goals of care -Discussed with the patient's daughter and patient -full code for now -was suppose to have outpatient palliative consult on 2/14 -consult palliative -overall prognosis poor      Status is: Inpatient  Remains inpatient appropriate because:IV treatments appropriate due to intensity of illness or inability to take PO   Dispo: The patient is from: Home              Anticipated d/c is to: Home  Anticipated d/c date is: 2 days              Patient currently is not medically stable to d/c.   Difficult to place patient Yes        Family Communication:   Daughter updated 2/13  Consultants:  palliative  Code Status:  FULL   DVT Prophylaxis:[place] Lovenox   Procedures: As Listed  in Progress Note Above  Antibiotics: None      Subjective: Patient denies fevers, chills, headache, chest pain, dyspnea, nausea, vomiting, diarrhea, abdominal pain, dysuria, hematuria, hematochezia, and melena.   Objective: Vitals:   05/07/20 2000 05/07/20 2123 05/07/20 2128 05/08/20 0329  BP: (!) 120/47 (!) 111/47  (!) 98/42  Pulse: 72 73  73  Resp: 15 20  18   Temp:  97.7 F (36.5 C)  98.2 F (36.8 C)  TempSrc:  Oral  Oral  SpO2: 94% 99% 99% 94%  Weight:  86 kg    Height:  6' (1.829 m)      Intake/Output Summary (Last 24 hours) at 05/08/2020 0981 Last data filed at 05/08/2020 0500 Gross per 24 hour  Intake 1873.49 ml  Output --  Net 1873.49 ml   Weight change:  Exam:   General:  Pt is alert, follows commands appropriately, not in acute distress  HEENT: No icterus, No thrush, No neck mass, [place]/AT  Cardiovascular: RRR, S1/S2, no rubs, no gallops  Respiratory: bibasilar crackles. No wheeze  Abdomen: Soft/+BS, non tender, non distended, no guarding  Extremities: 2+LE edema, No lymphangitis, No petechiae, No rashes, no synovitis   Data Reviewed: I have personally reviewed following labs and imaging studies Basic Metabolic Panel: Recent Labs  Lab 05/07/20 1218 05/08/20 0536  NA 138 138  K 5.0 4.7  CL 109 110  CO2 21* 21*  GLUCOSE 80 97  BUN 54* 55*  CREATININE 2.51* 2.80*  CALCIUM 8.3* 7.9*   Liver Function Tests: Recent Labs  Lab 05/07/20 1218 05/08/20 0536  AST 52* 47*  ALT 24 23  ALKPHOS 136* 124  BILITOT 2.5* 2.1*  PROT 5.9* 5.4*  ALBUMIN 1.8* 1.7*   No results for input(s): LIPASE, AMYLASE in the last 168 hours. Recent Labs  Lab 05/07/20 1507  AMMONIA 31   Coagulation Profile: No results for input(s): INR, PROTIME in the last 168 hours. CBC: Recent Labs  Lab 05/07/20 1218  WBC 7.6  NEUTROABS 5.4  HGB 11.7*  HCT 35.7*  MCV 101.1*  PLT 112*   Cardiac Enzymes: No results for input(s): CKTOTAL, CKMB, CKMBINDEX, TROPONINI in  the last 168 hours. BNP: Invalid input(s): POCBNP CBG: Recent Labs  Lab 05/07/20 1144 05/07/20 1254 05/07/20 1406 05/07/20 1759 05/07/20 1933  GLUCAP 57* 81 85 81 90   HbA1C: No results for input(s): HGBA1C in the last 72 hours. Urine analysis:    Component Value Date/Time   COLORURINE YELLOW 05/07/2020 1850   APPEARANCEUR HAZY (A) 05/07/2020 1850   LABSPEC 1.017 05/07/2020 1850   PHURINE 5.0 05/07/2020 1850   GLUCOSEU 50 (A) 05/07/2020 1850   HGBUR LARGE (A) 05/07/2020 1850   BILIRUBINUR SMALL (A) 05/07/2020 1850   KETONESUR 5 (A) 05/07/2020 1850   PROTEINUR 30 (A) 05/07/2020 1850   UROBILINOGEN 1.0 01/28/2015 0925   NITRITE NEGATIVE 05/07/2020 1850   LEUKOCYTESUR TRACE (A) 05/07/2020 1850   Sepsis Labs: @LABRCNTIP (procalcitonin:4,lacticidven:4) ) Recent Results (from the past 240 hour(s))  SARS CORONAVIRUS 2 (Jacy Brocker 6-24 HRS) Nasopharyngeal Nasopharyngeal Swab     Status: None  Collection Time: 05/07/20 12:07 PM   Specimen: Nasopharyngeal Swab  Result Value Ref Range Status   SARS Coronavirus 2 NEGATIVE NEGATIVE Final    Comment: (NOTE) SARS-CoV-2 target nucleic acids are NOT DETECTED.  The SARS-CoV-2 RNA is generally detectable in upper and lower respiratory specimens during the acute phase of infection. Negative results do not preclude SARS-CoV-2 infection, do not rule out co-infections with other pathogens, and should not be used as the sole basis for treatment or other patient management decisions. Negative results must be combined with clinical observations, patient history, and epidemiological information. The expected result is Negative.  Fact Sheet for Patients: SugarRoll.be  Fact Sheet for Healthcare Providers: https://www.woods-mathews.com/  This test is not yet approved or cleared by the [place] FDA and  has been authorized for detection and/or diagnosis of SARS-CoV-2 by FDA under an Emergency Use  Authorization (EUA). This EUA will remain  in effect (meaning this test can be used) for the duration of the COVID-19 declaration under Se ction 564(b)(1) of the Act, 21 U.S.C. section 360bbb-3(b)(1), unless the authorization is terminated or revoked sooner.  Performed at [facility], [place] [address] [place], [place] [address]      Scheduled Meds: . allopurinol  100 mg Oral Daily  . brimonidine  1 drop Both Eyes BID  . enoxaparin (LOVENOX) injection  30 mg Subcutaneous Q24H  . feeding supplement  237 mL Oral BID BM  . ketotifen  1 drop Both Eyes BID  . lactulose  10 g Oral Daily  . latanoprost  1 drop Both Eyes QHS  . levothyroxine  50 mcg Oral Q0600  . protein supplement  30 mL Oral TID WC  . Venelex  1 application Apply externally TID   Continuous Infusions: . sodium chloride 125 mL/hr at 05/08/20 0112    Procedures/Studies: CT HEAD WO CONTRAST  Result Date: 04/11/2020 CLINICAL DATA:  Altered mental status. EXAM: CT HEAD WITHOUT CONTRAST TECHNIQUE: Contiguous axial images were obtained from the base of the skull through the vertex without intravenous contrast. COMPARISON:  None. FINDINGS: Brain: Mild diffuse cortical atrophy is noted. Mild chronic ischemic white matter disease is noted. No mass effect or midline shift is noted. Ventricular size is within normal limits. There is no evidence of mass lesion, hemorrhage or acute infarction. Vascular: No hyperdense vessel or unexpected calcification. Skull: Normal. Negative for fracture or focal lesion. Sinuses/Orbits: No acute finding. Other: None. IMPRESSION: Mild diffuse cortical atrophy. Mild chronic ischemic white matter disease. No acute intracranial abnormality seen. Electronically Signed   By: Marijo Conception M.D.   On: 04/11/2020 11:17   DG Chest Port 1 View  Result Date: 05/07/2020 CLINICAL DATA:  Shortness of breath EXAM: PORTABLE CHEST 1 VIEW COMPARISON:  April 11, 2020 FINDINGS: There is a small right pleural  effusion with airspace opacity in the lateral right base. There is atelectatic change in the left base. Heart size and pulmonary vascularity are within normal limits. No adenopathy. No bone lesions. IMPRESSION: Right pleural effusion. Airspace opacity lateral right base concerning for pneumonia. Atelectasis left base. Stable cardiac silhouette. Electronically Signed   By: Lowella Grip III M.D.   On: 05/07/2020 13:01   DG Chest Port 1 View  Result Date: 04/11/2020 CLINICAL DATA:  87 year old male with shortness of breath. Cirrhosis. Negative for COVID-19 04/10/2020. EXAM: PORTABLE CHEST 1 VIEW COMPARISON:  Portable chest 04/10/2020 and earlier. FINDINGS: Portable AP upright view at 0547 hours. Small bilateral pleural effusions with mildly decreased  veiling opacity at both lung bases from yesterday. Mediastinal contours remain normal. Pulmonary vascularity also appears mildly decreased, no overt edema. No air bronchograms or pneumothorax identified. Stable visible bowel gas pattern, cholecystectomy clips. No acute osseous abnormality identified. IMPRESSION: 1. Small bilateral pleural effusions perhaps mildly decreased since yesterday. Lung base atelectasis. 2. No overt edema.  No new cardiopulmonary abnormality. Electronically Signed   By: Genevie Ann M.D.   On: 04/11/2020 06:32   DG Chest Port 1 View  Result Date: 04/10/2020 CLINICAL DATA:  Cough, aspiration.  Pending COVID-19 test. EXAM: PORTABLE CHEST 1 VIEW COMPARISON:  03/29/2020 FINDINGS: Patient is rotated to the right. Lungs are adequately inflated demonstrate persistent bibasilar opacification with worsening in the right base likely small effusions with associated bibasilar atelectasis. Infection over the lung bases is possible. Mild stable cardiomegaly. Remainder of the exam is unchanged. IMPRESSION: Persistent bibasilar opacification with interval worsening in the right base likely small effusions with associated bibasilar atelectasis. Infection  over the lung bases is possible. Electronically Signed   By: Marin Olp M.D.   On: 04/10/2020 16:22    Orson Eva, DO  [facility]  If 7PM-7AM, please contact night-coverage [email or web address] Password [email or web address] 05/08/2020, 8:11 AM   LOS: 1 day

## 2020-05-08 NOTE — Progress Notes (Signed)
Patient has not voided throughout shift. Bladder scan at 1755 revealed greater than 676. Patient verbalized he did not feel the urge to void. Dr. Carles Collet made aware, advised staff not to cath him but to monitor. Will notify oncoming shift. Will continue to monitor.

## 2020-05-09 ENCOUNTER — Other Ambulatory Visit: Payer: Self-pay | Admitting: Nurse Practitioner

## 2020-05-09 DIAGNOSIS — I5032 Chronic diastolic (congestive) heart failure: Secondary | ICD-10-CM | POA: Diagnosis not present

## 2020-05-09 DIAGNOSIS — R627 Adult failure to thrive: Secondary | ICD-10-CM | POA: Diagnosis not present

## 2020-05-09 DIAGNOSIS — K7460 Unspecified cirrhosis of liver: Secondary | ICD-10-CM | POA: Diagnosis not present

## 2020-05-09 DIAGNOSIS — N179 Acute kidney failure, unspecified: Secondary | ICD-10-CM | POA: Diagnosis not present

## 2020-05-09 LAB — CBC
HCT: 30.6 % — ABNORMAL LOW (ref 39.0–52.0)
Hemoglobin: 10.3 g/dL — ABNORMAL LOW (ref 13.0–17.0)
MCH: 33.6 pg (ref 26.0–34.0)
MCHC: 33.7 g/dL (ref 30.0–36.0)
MCV: 99.7 fL (ref 80.0–100.0)
Platelets: 87 10*3/uL — ABNORMAL LOW (ref 150–400)
RBC: 3.07 MIL/uL — ABNORMAL LOW (ref 4.22–5.81)
RDW: 15.2 % (ref 11.5–15.5)
WBC: 10.9 10*3/uL — ABNORMAL HIGH (ref 4.0–10.5)
nRBC: 0 % (ref 0.0–0.2)

## 2020-05-09 LAB — COMPREHENSIVE METABOLIC PANEL
ALT: 22 U/L (ref 0–44)
AST: 56 U/L — ABNORMAL HIGH (ref 15–41)
Albumin: 1.9 g/dL — ABNORMAL LOW (ref 3.5–5.0)
Alkaline Phosphatase: 117 U/L (ref 38–126)
Anion gap: 9 (ref 5–15)
BUN: 58 mg/dL — ABNORMAL HIGH (ref 8–23)
CO2: 16 mmol/L — ABNORMAL LOW (ref 22–32)
Calcium: 7.7 mg/dL — ABNORMAL LOW (ref 8.9–10.3)
Chloride: 115 mmol/L — ABNORMAL HIGH (ref 98–111)
Creatinine, Ser: 3.24 mg/dL — ABNORMAL HIGH (ref 0.61–1.24)
GFR, Estimated: 18 mL/min — ABNORMAL LOW (ref >60)
Glucose, Bld: 62 mg/dL — ABNORMAL LOW (ref 70–99)
Potassium: 5.8 mmol/L — ABNORMAL HIGH (ref 3.5–5.1)
Sodium: 140 mmol/L (ref 135–145)
Total Bilirubin: 2.3 mg/dL — ABNORMAL HIGH (ref 0.3–1.2)
Total Protein: 5.3 g/dL — ABNORMAL LOW (ref 6.5–8.1)

## 2020-05-09 LAB — FOLATE: Folate: 19.4 ng/mL (ref >5.9)

## 2020-05-09 LAB — GLUCOSE, CAPILLARY: Glucose-Capillary: 99 mg/dL (ref 70–99)

## 2020-05-09 LAB — TSH: TSH: 4.012 u[IU]/mL (ref 0.350–4.500)

## 2020-05-09 LAB — VITAMIN B12: Vitamin B-12: 1177 pg/mL — ABNORMAL HIGH (ref 180–914)

## 2020-05-09 MED ORDER — ADULT MULTIVITAMIN W/MINERALS CH
1.0000 | ORAL_TABLET | Freq: Every day | ORAL | Status: DC
  Administered 2020-05-09 – 2020-05-10 (×2): 1 via ORAL
  Filled 2020-05-09 (×2): qty 1

## 2020-05-09 MED ORDER — ALBUMIN HUMAN 25 % IV SOLN
25.0000 g | Freq: Four times a day (QID) | INTRAVENOUS | Status: AC
  Administered 2020-05-09 – 2020-05-10 (×3): 25 g via INTRAVENOUS
  Filled 2020-05-09 (×3): qty 100

## 2020-05-09 MED ORDER — ALBUMIN HUMAN 25 % IV SOLN
25.0000 g | Freq: Four times a day (QID) | INTRAVENOUS | Status: DC
Start: 2020-05-09 — End: 2020-05-09

## 2020-05-09 MED ORDER — PROSOURCE PLUS PO LIQD
30.0000 mL | Freq: Three times a day (TID) | ORAL | Status: DC
  Administered 2020-05-09 – 2020-05-10 (×2): 30 mL via ORAL
  Filled 2020-05-09 (×2): qty 30

## 2020-05-09 MED ORDER — SODIUM ZIRCONIUM CYCLOSILICATE 10 G PO PACK
10.0000 g | PACK | Freq: Once | ORAL | Status: AC
  Administered 2020-05-09: 10 g via ORAL
  Filled 2020-05-09: qty 1

## 2020-05-09 MED ORDER — CIPROFLOXACIN IN D5W 200 MG/100ML IV SOLN
200.0000 mg | INTRAVENOUS | Status: DC
  Administered 2020-05-09: 200 mg via INTRAVENOUS
  Filled 2020-05-09 (×2): qty 100

## 2020-05-09 NOTE — TOC Progression Note (Signed)
Transition of Care [facility]) - Progression Note    Patient Details  Name: Gabriel Spencer MRN: [id number] Date of Birth: 09/23/1931  Transition of Care [facility]) CM/SW Contact  Boneta Lucks, RN Phone Number: 05/09/2020, 2:36 PM  Clinical Narrative:   Family has elected to go home with Hospice. They will need a hospital bed. [facility] did not get to make first visit for palliative services, before patient was back in the hospital.  [facility] spoke with Manuela Schwartz - daughter. At some point they might need facility. She was okay with making the referral to [facility].  They will need a hospital bed. Plan to discharge home tomorrow. Referral made to [facility]. She will call the Manuela Schwartz. TOC to follow.    Expected Discharge Plan: Home w Hospice Care Barriers to Discharge: Continued Medical Work up  Expected Discharge Plan and Services Expected Discharge Plan: [place] Acute Care Choice: Durable Medical Equipment,Hospice Living arrangements for the past 2 months: [place] Date [place]: 05/09/20 Time [place]: 1405 Representative spoke with at [place]: [place]   Readmission Risk Interventions Readmission Risk Prevention Plan 04/13/2020  Transportation Screening Complete  PCP or Specialist Appt within 5-7 Days Complete  Home Care Screening Complete  Medication Review (RN CM) Complete  Some recent data might be hidden

## 2020-05-09 NOTE — Evaluation (Signed)
Clinical/Bedside Swallow Evaluation Patient Details  Name: Gabriel Spencer MRN: [id number] Date of Birth: 1931/10/27  Today's Date: 05/09/2020 Time: SLP Start Time (ACUTE ONLY): 1100 SLP Stop Time (ACUTE ONLY): 1125 SLP Time Calculation (min) (ACUTE ONLY): 25 min  Past Medical History:  Past Medical History:  Diagnosis Date  . Acute lower UTI 01/10/2019  . Arthritis   . Chronic diastolic (congestive) heart failure ([place])   . Cirrhosis of liver ([place])   . Glaucoma   . Gout   . Macular degeneration   . Thyroid disease   . Vitamin D deficiency    Past Surgical History:  Past Surgical History:  Procedure Laterality Date  . BLADDER SURGERY    . CATARACT EXTRACTION, BILATERAL    . CHOLECYSTECTOMY  11/30/2011   Procedure: LAPAROSCOPIC CHOLECYSTECTOMY;  Surgeon: Jamesetta So, MD;  Location: [facility];  Service: General;  Laterality: N/A;  . CYSTOSCOPY/RETROGRADE/URETEROSCOPY Bilateral 03/10/2015   Procedure: Consuela Mimes BILATERAL Concha Se;  Surgeon: Irine Seal, MD;  Location: [facility];  Service: Urology;  Laterality: Bilateral;  . LYMPH NODE BIOPSY  1975   right  . RETINAL DETACHMENT SURGERY     right eye  . TRANSURETHRAL RESECTION OF BLADDER TUMOR Bilateral 03/10/2015   Procedure: TRANSURETHRAL RESECTION OF BLADDER TUMOR (TURBT) INSTILLATION WITH MITOMYCIN C IN RECOVERY;  Surgeon: Irine Seal, MD;  Location: [facility];  Service: Urology;  Laterality: Bilateral;  . TYMPANOPLASTY     right   HPI:  87 year old male with a history of liver cirrhosis, diastolic CHF, hypothyroidism, gouty arthritis presenting with shortness of breath and generalized weakness.  The patient is a poor historian.  This history is obtained from review of the medical record and speaking with the patient's daughter who is an Therapist, sports.  Notably, the patient has had 2 recent hospital admissions in January.  He was first hospitalized from 03/29/2020 to 04/01/2020 for acute on chronic diastolic CHF.  He was discharged to the [facility]  for STR with furosemide 20 mg daily.  Unfortunately, he was hospitalized again from 04/10/2020 to 04/13/2020 when he was transferred over to the hospital from the [facility].  He was treated for aspiration pneumonia at that time.  The patient ultimately returned home on 04/29/2020.  Unfortunately, he has continued to have worsening generalized weakness.  The patient has had poor oral intake and basically has not been able to get out of bed for about a week.  In addition, the patient had been complaining of some shortness of breath since 05/06/2020.  There is been no reports of fevers, chills, headache, chest pain, nausea, vomiting, diarrhea.  He has been taking his lactulose.  Apparently he has been having 1-2 loose bowel movements daily.  Daughter states that his mentation has been as good as usual.  She states that he has been receiving his medications, but has not been on any diuretics since discharge home on 04/29/2020.  Because of his worsening generalized weakness and failure to thrive, the patient was brought to the hospital for further evaluation. Chest xray shows: Right pleural effusion. Airspace opacity lateral right base concerning for pneumonia. Atelectasis left base. RN requested BSE.   Assessment / Plan / Recommendation Clinical Impression  Pt known to SLP service from admission in January 2022. He had a clinical swallow evaluation at that time and was placed on D3/thin and then went to [facility]. He was discharged home from [facility], but then admitted to [facility] currently. Pt with dentition, however in poor repair. Oral care  completed, Pt with xerostomia. He has low vision and needs assist with meals. RN requested BSE yesterday and diet was downgraded from mechanical soft to puree. Pt alert, but confused- says he was kidnapped and then asks where he is. He was able to follow commands easily however. Pt without overt sign or symptoms of aspiration, but does present with audible swallow at times with liquids (this was  noted during previous admission as well). Pt with prolonged oral prep likely impacted by poor dentition and xerostomia, however he was able to self present graham crackers with min cues for liquid wash as needed. Current chest xray with evidence of possible PNA, can complete MBSS if desired. No family present at this time. Palliative consult pending. Recommend D2/fine chop and thin liquids and SLP to follow.   SLP Visit Diagnosis: Dysphagia, unspecified (R13.10)    Aspiration Risk  Mild aspiration risk    Diet Recommendation Dysphagia 2 (Fine chop);Thin liquid   Liquid Administration via: Cup;Straw Medication Administration: Whole meds with puree Supervision: Staff to assist with self feeding (Pt with low vision) Compensations: Small sips/bites Postural Changes: Seated upright at 90 degrees;Remain upright for at least 30 minutes after po intake    Other  Recommendations Oral Care Recommendations: Oral care BID;Staff/trained caregiver to provide oral care Other Recommendations: Clarify dietary restrictions   Follow up Recommendations 24 hour supervision/assistance      Frequency and Duration min 2x/week  1 week       Prognosis Prognosis for Safe Diet Advancement: Fair Barriers to Reach Goals:  (repeat hospitalizations)      Swallow Study   General Date of Onset: 05/07/20 HPI: 87 year old male with a history of liver cirrhosis, diastolic CHF, hypothyroidism, gouty arthritis presenting with shortness of breath and generalized weakness.  The patient is a poor historian.  This history is obtained from review of the medical record and speaking with the patient's daughter who is an Therapist, sports.  Notably, the patient has had 2 recent hospital admissions in January.  He was first hospitalized from 03/29/2020 to 04/01/2020 for acute on chronic diastolic CHF.  He was discharged to the [facility] for STR with furosemide 20 mg daily.  Unfortunately, he was hospitalized again from 04/10/2020 to 04/13/2020 when  he was transferred over to the hospital from the [facility].  He was treated for aspiration pneumonia at that time.  The patient ultimately returned home on 04/29/2020.  Unfortunately, he has continued to have worsening generalized weakness.  The patient has had poor oral intake and basically has not been able to get out of bed for about a week.  In addition, the patient had been complaining of some shortness of breath since 05/06/2020.  There is been no reports of fevers, chills, headache, chest pain, nausea, vomiting, diarrhea.  He has been taking his lactulose.  Apparently he has been having 1-2 loose bowel movements daily.  Daughter states that his mentation has been as good as usual.  She states that he has been receiving his medications, but has not been on any diuretics since discharge home on 04/29/2020.  Because of his worsening generalized weakness and failure to thrive, the patient was brought to the hospital for further evaluation. Chest xray shows: Right pleural effusion. Airspace opacity lateral right base concerning for pneumonia. Atelectasis left base. RN requested BSE. Type of Study: Bedside Swallow Evaluation Previous Swallow Assessment: 03/2020 BSE d3/thin feeder assist Diet Prior to this Study: Dysphagia 1 (puree);Thin liquids Temperature Spikes Noted: No Respiratory Status:  Nasal cannula History of Recent Intubation: No Behavior/Cognition: Alert;Cooperative;Pleasant mood Oral Cavity Assessment: Dry Oral Care Completed by SLP: Yes Oral Cavity - Dentition: Poor condition Vision: Impaired for self-feeding Self-Feeding Abilities: Able to feed self;Needs set up;Needs assist Patient Positioning: Upright in bed Baseline Vocal Quality: Normal Volitional Cough: Strong Volitional Swallow: Able to elicit    Oral/Motor/Sensory Function Overall Oral Motor/Sensory Function: Mild impairment   Ice Chips Ice chips: Within functional limits Presentation: Spoon   Thin Liquid Thin Liquid: Within  functional limits Presentation: Cup;Straw;Spoon;Self Fed Other Comments: audible swallow at times    Nectar Thick Nectar Thick Liquid: Not tested   Honey Thick Honey Thick Liquid: Not tested   Puree Puree: Within functional limits Presentation: Spoon   Solid     Solid: Impaired Presentation: Self Fed Oral Phase Impairments: Impaired mastication Oral Phase Functional Implications: Prolonged oral transit     Thank you,  Genene Churn, [place]  PORTER,DABNEY 05/09/2020,11:54 AM

## 2020-05-09 NOTE — Progress Notes (Addendum)
PROGRESS NOTE  Gabriel Spencer BWL:[id number] DOB: 08/16/1931 DOA: 05/07/2020 PCP: Doree Albee, MD  Brief History:  87 year old male with a history of liver cirrhosis, diastolic CHF, hypothyroidism, gouty arthritis presenting with shortness of breath and generalized weakness.  The patient is a poor historian.  This history is obtained from review of the medical record and speaking with the patient's daughter who is an Therapist, sports.  Notably, the patient has had 2 recent hospital admissions in January.  He was first hospitalized from 03/29/2020 to 04/01/2020 for acute on chronic diastolic CHF.  He was discharged to the [facility] for STR with furosemide 20 mg daily.  Unfortunately, he was hospitalized again from 04/10/2020 to 04/13/2020 when he was transferred over to the hospital from the [facility].  He was treated for aspiration pneumonia at that time.  The patient ultimately returned home on 04/29/2020.  Unfortunately, he has continued to have worsening generalized weakness.  The patient has had poor oral intake and basically has not been able to get out of bed for about a week.  In addition, the patient had been complaining of some shortness of breath since 05/06/2020.  There is been no reports of fevers, chills, headache, chest pain, nausea, vomiting, diarrhea.  He has been taking his lactulose.  Apparently he has been having 1-2 loose bowel movements daily.  Daughter states that his mentation has been as good as usual.  She states that he has been receiving his medications, but has not been on any diuretics since discharge home on 04/29/2020.  Because of his worsening generalized weakness and failure to thrive, the patient was brought to the hospital for further evaluation. In the emergency department, the patient was afebrile hemodynamically stable with soft blood pressures in the  100s.  Oxygen saturation was 94-96% on 3 L.  BMP showed a serum creatinine of 2.51, sodium 130, potassium 5.0, and albumin 1.8.   WBC was 7.6 with hemoglobin 11.7 and platelets 112,000.  The patient was started on IV fluids.  Assessment/Plan: Acute kidney injury -Secondary to volume depletion, hemodynamic changes but concerned about hepatorenal syndrome -Renal ultrasound--no hydronephrosis -renal function worsening -give albumin 25 grams x 3  Failure to thrive -Serum G81--1572 -Folic IOMB--55.9 -RCB--6.384 -UA 11-20 WBC -Obtain urine culture--pending -Patient has continued to have a functional decline since November 2021  UTI -start cipro pending culture data -UA-->50WBC  Anasarca/liver cirrhosis -Presumptively, the patient has liver cirrhosis secondary to NASH -Viral hepatitis panel in January 2020 was negative -Previous abdominal imaging suggested hepatic steatosis -His hypoalbuminemia is also contributing to anasarca -Holding diuretics temporarily in the setting of AKI -ammonia 31   Hyperkalemia -Lokelma x 1  Chronic diastolic CHF -Although the patient has significant peripheral edema and anasarca, this is likely due to his liver cirrhosis and third spacing -He appears intravascularly volume depleted -03/30/2020 echo EF 70-25%, no WMA, mild elevated PASP  Thrombocytopenia -Secondary to liver cirrhosis -Monitor for signs of bleeding -Overall hemoglobin stable  Hypothyroidism -Continue Synthroid  Stage II sacral pressure injury -Local wound care -Present on admission  Goals of care -Discussed with the patient's daughter and patient -change to DNR -was suppose to have outpatient palliative consult on 2/14 -consult palliative--appreciated -overall prognosis poor      Status is: Inpatient  Remains inpatient appropriate because:IV treatments appropriate due to intensity of illness or inability to take PO   Dispo: The patient is from: Home  Anticipated d/c is  to: Home with hospice 2/15  Anticipated d/c date is: 1 days  Patient  currently is not medically stable to d/c.              Difficult to place patient Yes        Family Communication:   Daughter updated 2/14  Consultants:  palliative  Code Status:  FULL   DVT Prophylaxis:Vina Lovenox   Procedures: As Listed in Progress Note Above  Antibiotics: cipro 2/14>>  Subjective: Patient denies fevers, chills, headache, chest pain, dyspnea, nausea, vomiting, diarrhea, abdominal pain, dysuria   Objective: Vitals:   05/08/20 1416 05/08/20 2153 05/09/20 0502 05/09/20 1304  BP: (!) 115/53 (!) 98/47 (!) 110/44 (!) 110/43  Pulse: 73 89 83 79  Resp: 18 20 20 19   Temp: (!) 97.5 F (36.4 C) 97.8 F (36.6 C) (!) 97.5 F (36.4 C) 98.1 F (36.7 C)  TempSrc: Oral Oral Oral Oral  SpO2: 98% 100% 94% 97%  Weight:      Height:        Intake/Output Summary (Last 24 hours) at 05/09/2020 1522 Last data filed at 05/09/2020 0800 Gross per 24 hour  Intake 1464.23 ml  Output 30 ml  Net 1434.23 ml   Weight change:  Exam:   General:  Pt is alert, follows commands appropriately, not in acute distress  HEENT: No icterus, No thrush, No neck mass, [place]/AT  Cardiovascular: RRR, S1/S2, no rubs, no gallops  Respiratory: bibasilar crackles. No wheeze  Abdomen: Soft/+BS, non tender, non distended, no guarding  Extremities: 2+ LE edema, No lymphangitis, No petechiae, No rashes, no synovitis   Data Reviewed: I have personally reviewed following labs and imaging studies Basic Metabolic Panel: Recent Labs  Lab 05/07/20 1218 05/08/20 0536 05/09/20 0619  NA 138 138 140  K 5.0 4.7 5.8*  CL 109 110 115*  CO2 21* 21* 16*  GLUCOSE 80 97 62*  BUN 54* 55* 58*  CREATININE 2.51* 2.80* 3.24*  CALCIUM 8.3* 7.9* 7.7*   Liver Function Tests: Recent Labs  Lab 05/07/20 1218 05/08/20 0536 05/09/20 0619  AST 52* 47* 56*  ALT 24 23 22   ALKPHOS 136* 124 117  BILITOT 2.5* 2.1* 2.3*  PROT 5.9* 5.4* 5.3*  ALBUMIN 1.8* 1.7* 1.9*   No results for  input(s): LIPASE, AMYLASE in the last 168 hours. Recent Labs  Lab 05/07/20 1507  AMMONIA 31   Coagulation Profile: No results for input(s): INR, PROTIME in the last 168 hours. CBC: Recent Labs  Lab 05/07/20 1218 05/09/20 0819  WBC 7.6 10.9*  NEUTROABS 5.4  --   HGB 11.7* 10.3*  HCT 35.7* 30.6*  MCV 101.1* 99.7  PLT 112* 87*   Cardiac Enzymes: No results for input(s): CKTOTAL, CKMB, CKMBINDEX, TROPONINI in the last 168 hours. BNP: Invalid input(s): POCBNP CBG: Recent Labs  Lab 05/07/20 1254 05/07/20 1406 05/07/20 1759 05/07/20 1933 05/09/20 1352  GLUCAP 81 85 81 90 99   HbA1C: No results for input(s): HGBA1C in the last 72 hours. Urine analysis:    Component Value Date/Time   COLORURINE AMBER (A) 05/08/2020 1139   APPEARANCEUR CLOUDY (A) 05/08/2020 1139   LABSPEC 1.021 05/08/2020 1139   PHURINE 5.0 05/08/2020 1139   GLUCOSEU NEGATIVE 05/08/2020 1139   HGBUR LARGE (A) 05/08/2020 1139   BILIRUBINUR SMALL (A) 05/08/2020 1139   KETONESUR NEGATIVE 05/08/2020 1139   PROTEINUR 100 (A) 05/08/2020 1139   UROBILINOGEN 1.0 01/28/2015 0925   NITRITE NEGATIVE 05/08/2020 1139   LEUKOCYTESUR MODERATE (A)  05/08/2020 1139   Sepsis Labs: @LABRCNTIP (procalcitonin:4,lacticidven:4) ) Recent Results (from the past 240 hour(s))  SARS CORONAVIRUS 2 (Elainna Eshleman 6-24 HRS) Nasopharyngeal Nasopharyngeal Swab     Status: None   Collection Time: 05/07/20 12:07 PM   Specimen: Nasopharyngeal Swab  Result Value Ref Range Status   SARS Coronavirus 2 NEGATIVE NEGATIVE Final    Comment: (NOTE) SARS-CoV-2 target nucleic acids are NOT DETECTED.  The SARS-CoV-2 RNA is generally detectable in upper and lower respiratory specimens during the acute phase of infection. Negative results do not preclude SARS-CoV-2 infection, do not rule out co-infections with other pathogens, and should not be used as the sole basis for treatment or other patient management decisions. Negative results must be  combined with clinical observations, patient history, and epidemiological information. The expected result is Negative.  Fact Sheet for Patients: SugarRoll.be  Fact Sheet for Healthcare Providers: https://www.woods-mathews.com/  This test is not yet approved or cleared by the [place] FDA and  has been authorized for detection and/or diagnosis of SARS-CoV-2 by FDA under an Emergency Use Authorization (EUA). This EUA will remain  in effect (meaning this test can be used) for the duration of the COVID-19 declaration under Se ction 564(b)(1) of the Act, 21 U.S.C. section 360bbb-3(b)(1), unless the authorization is terminated or revoked sooner.  Performed at [facility], [place] [address], [place], [place] [address]      Scheduled Meds: . allopurinol  100 mg Oral Daily  . brimonidine  1 drop Both Eyes BID  . enoxaparin (LOVENOX) injection  30 mg Subcutaneous Q24H  . feeding supplement  237 mL Oral BID BM  . ketotifen  1 drop Both Eyes BID  . latanoprost  1 drop Both Eyes QHS  . levothyroxine  50 mcg Oral Q0600  . protein supplement  30 mL Oral TID WC  . Venelex  1 application Apply externally TID   Continuous Infusions: . albumin human      Procedures/Studies: CT HEAD WO CONTRAST  Result Date: 04/11/2020 CLINICAL DATA:  Altered mental status. EXAM: CT HEAD WITHOUT CONTRAST TECHNIQUE: Contiguous axial images were obtained from the base of the skull through the vertex without intravenous contrast. COMPARISON:  None. FINDINGS: Brain: Mild diffuse cortical atrophy is noted. Mild chronic ischemic white matter disease is noted. No mass effect or midline shift is noted. Ventricular size is within normal limits. There is no evidence of mass lesion, hemorrhage or acute infarction. Vascular: No hyperdense vessel or unexpected calcification. Skull: Normal. Negative for fracture or focal lesion. Sinuses/Orbits: No acute finding. Other: None.  IMPRESSION: Mild diffuse cortical atrophy. Mild chronic ischemic white matter disease. No acute intracranial abnormality seen. Electronically Signed   By: Marijo Conception M.D.   On: 04/11/2020 11:17   US RENAL  Result Date: 05/08/2020 CLINICAL DATA:  86 year old male with acute renal injury. Cirrhosis. EXAM: RENAL / URINARY TRACT ULTRASOUND COMPLETE COMPARISON:  Abdomen ultrasound 03/29/2020. CT Abdomen and Pelvis 03/29/2014. FINDINGS: Right Kidney: Renal measurements: 8.9 x 4.2 x 3.9 cm = volume: 77 mL. Preserved cortical echogenicity. No hydronephrosis or right renal mass. Left Kidney: Renal measurements: 9.9 x 4.6 x 3.4 cm = volume: 81 mL. Cortical echogenicity at the upper limits of normal (image 16). No left hydronephrosis or renal mass. Bladder: Decompressed, Foley catheter balloon visible. Other: Small volume ascites greater on the right. Nodular, cirrhotic liver (image 6). IMPRESSION: 1. Negative ultrasound appearance of both kidneys aside from renal volume loss. 2. Bladder decompressed by Foley catheter. 3.  Small volume ascites.  Cirrhotic liver. Electronically Signed   By: Genevie Ann M.D.   On: 05/08/2020 11:40   DG Chest Port 1 View  Result Date: 05/07/2020 CLINICAL DATA:  Shortness of breath EXAM: PORTABLE CHEST 1 VIEW COMPARISON:  April 11, 2020 FINDINGS: There is a small right pleural effusion with airspace opacity in the lateral right base. There is atelectatic change in the left base. Heart size and pulmonary vascularity are within normal limits. No adenopathy. No bone lesions. IMPRESSION: Right pleural effusion. Airspace opacity lateral right base concerning for pneumonia. Atelectasis left base. Stable cardiac silhouette. Electronically Signed   By: Lowella Grip III M.D.   On: 05/07/2020 13:01   DG Chest Port 1 View  Result Date: 04/11/2020 CLINICAL DATA:  87 year old male with shortness of breath. Cirrhosis. Negative for COVID-19 04/10/2020. EXAM: PORTABLE CHEST 1 VIEW COMPARISON:   Portable chest 04/10/2020 and earlier. FINDINGS: Portable AP upright view at 0547 hours. Small bilateral pleural effusions with mildly decreased veiling opacity at both lung bases from yesterday. Mediastinal contours remain normal. Pulmonary vascularity also appears mildly decreased, no overt edema. No air bronchograms or pneumothorax identified. Stable visible bowel gas pattern, cholecystectomy clips. No acute osseous abnormality identified. IMPRESSION: 1. Small bilateral pleural effusions perhaps mildly decreased since yesterday. Lung base atelectasis. 2. No overt edema.  No new cardiopulmonary abnormality. Electronically Signed   By: Genevie Ann M.D.   On: 04/11/2020 06:32   DG Chest Port 1 View  Result Date: 04/10/2020 CLINICAL DATA:  Cough, aspiration.  Pending COVID-19 test. EXAM: PORTABLE CHEST 1 VIEW COMPARISON:  03/29/2020 FINDINGS: Patient is rotated to the right. Lungs are adequately inflated demonstrate persistent bibasilar opacification with worsening in the right base likely small effusions with associated bibasilar atelectasis. Infection over the lung bases is possible. Mild stable cardiomegaly. Remainder of the exam is unchanged. IMPRESSION: Persistent bibasilar opacification with interval worsening in the right base likely small effusions with associated bibasilar atelectasis. Infection over the lung bases is possible. Electronically Signed   By: Marin Olp M.D.   On: 04/10/2020 16:22    Orson Eva, DO  [facility]  If 7PM-7AM, please contact night-coverage [email or web address] Password [email or web address] 05/09/2020, 3:22 PM   LOS: 2 days

## 2020-05-09 NOTE — Consult Note (Signed)
Attempted to speak with patient at bedside.  He is moderatly confused, only able to discuss place and name.  He did request I phone his daughter Jannet Mantis if I needed any informaiton.  Spoke with Robyne Peers phone.  She is an Therapist, sports, making the conversation related to his decline in condition understandable from a clinical standpoint.  After discussing the further decline in kidney function and the decrease in PO intake Manuela Schwartz told me that she, her mother and brother (who lives with the patient and his wife) would like for hospice to be called for in home care and symptom management.  She also indicated that they had agreed to change his code status to DNR here in the hospital and intend on finishing ACP documentation with the hospice team at home.  Dr. Carles Collet notified of the conversation with Manuela Schwartz.  He is moving forward with the referral to hospice and notification of the Care management team.   follow-up only if necessary, otherwise the consult is completed.  Kizzie Fantasia, MSN, RN-BC, [facility], [facility] Palliative Clinical Specialist [facility]

## 2020-05-09 NOTE — Progress Notes (Signed)
Cath patient was only able to get out 30cc via in and out cath. Urine stopped draining at only 30cc . Patient was in a lot of discomfort and screamed out in pain during catheterization. Patient verbalized that he did not want to be catheterized again. Patient did not report any bladder fullness or discomfort. Will continue to monitor.

## 2020-05-09 NOTE — Progress Notes (Signed)
Initial Nutrition Assessment  DOCUMENTATION CODES:   Severe malnutrition in context of chronic illness  INTERVENTION:   -Continue Ensure Enlive po BID, each supplement provides 350 kcal and 20 grams of protein -D/c Prosource No Carb -30 ml Prosource Plus TID, each supplement provides 100 kcals and 15 grams protein -Magic cup TID with meals, each supplement provides 290 kcal and 9 grams of protein -Hormel Shake TID with meals, each supplement provides 520 kcals and 22 grams protein -MVI with minerals daily -Feeding assistance with meals  NUTRITION DIAGNOSIS:   Severe Malnutrition related to chronic illness (cirrhosis, CHF) as evidenced by percent weight loss,moderate muscle depletion,edema,moderate fat depletion,severe fat depletion,severe muscle depletion.  GOAL:   Patient will meet greater than or equal to 90% of their needs  MONITOR:   PO intake,Supplement acceptance,Diet advancement,Labs,Weight trends,Skin,I & O's  REASON FOR ASSESSMENT:   Malnutrition Screening Tool    ASSESSMENT:   Gabriel Spencer is a 87 y.o. male with a history of cirrhosis of liver, chronic diastolic heart failure, thyroid disease, protein calorie malnutrition.  Patient was recently hospitalized due to aspiration pneumonia and respiratory failure.  The patient was discharged to a SNF and was recently sent home.  At home, the patient has been not eating or drinking because of lack of appetite.  He states that he wants to eat, but just does not have the appetite to do so.  He was brought to the hospital by the family.  No palliating or provoking factors.  Symptoms are worsening.  Pt admitted with acute renal failure and FTT.   2/14- s/p BSE- advanced to dysphagia 1 diet with thin liquids  Reviewed I/O's: +907 ml x 24 hours and +2.8 L since admission  UOP: 60 ml x 24 hours  Spoke with pt at bedside; pt conversant, however, fatigued easily with questions secondary to respiratory status. RD used mainly  close ended questions to communicate with pt.   Noted pt with minimal teeth; he reports being able to tolerate purees and liquids well, but is more comfortable drinking liquids and solid foods currently. Pt consumed 75% of breakfast his morning, however, had no lunch. Noted a cup of applesauce that was about 25% eaten and a cup of orange juice that was about 50% consumed on tray table. Per MAR, pt refused both Prosource and Ensure Enlive supplements today.   Reviewed wt hx; pt has experienced a 5.6% wt loss over the past month, which is significant for time frame.   Pt awaiting palliative care consult to discuss goals of care.   Labs reviewed: K: 5.8.   NUTRITION - FOCUSED PHYSICAL EXAM:  Flowsheet Row Most Recent Value  Orbital Region Severe depletion  Upper Arm Region Severe depletion  Thoracic and Lumbar Region Moderate depletion  Buccal Region Severe depletion  Temple Region Severe depletion  Clavicle Bone Region Severe depletion  Clavicle and Acromion Bone Region Severe depletion  Scapular Bone Region Severe depletion  Dorsal Hand Moderate depletion  Patellar Region Mild depletion  Anterior Thigh Region Mild depletion  Posterior Calf Region Mild depletion  Edema (RD Assessment) Moderate  Hair Reviewed  Eyes Reviewed  Mouth Reviewed  Skin Reviewed  Nails Reviewed       Diet Order:   Diet Order            DIET - DYS 1 Room service appropriate? Yes; Fluid consistency: Thin  Diet effective now  EDUCATION NEEDS:   Not appropriate for education at this time  Skin:  Skin Assessment: Skin Integrity Issues: Skin Integrity Issues:: Stage II Stage II: lt buttocks  Last BM:  05/07/20  Height:   Ht Readings from Last 1 Encounters:  05/07/20 6' (1.829 m)    Weight:   Wt Readings from Last 1 Encounters:  05/07/20 86 kg    Ideal Body Weight:  80.9 kg  BMI:  Body mass index is 25.71 kg/m.  Estimated Nutritional Needs:   Kcal:   2200-2400  Protein:  120-135 grams  Fluid:  > 2L    Loistine Chance, RD, LDN, [place] Registered Dietitian II Certified Diabetes Care and Education Specialist Please refer to [facility] for RD and/or RD on-call/weekend/after hours pager

## 2020-05-09 NOTE — Progress Notes (Signed)
[facility] ([facility]) Community Based Palliative Care       This patient has been referred to our palliative care services in the community.  [facility] will continue to follow for any discharge planning needs and to coordinate admission onto palliative care.    Thank you for the opportunity to participate in this patient's care.     Chrislyn King, BSN, RN [facility] Hospital Liaison   [phone] (24h on call) 

## 2020-05-10 DIAGNOSIS — R627 Adult failure to thrive: Secondary | ICD-10-CM | POA: Diagnosis not present

## 2020-05-10 DIAGNOSIS — I5032 Chronic diastolic (congestive) heart failure: Secondary | ICD-10-CM | POA: Diagnosis not present

## 2020-05-10 DIAGNOSIS — N179 Acute kidney failure, unspecified: Secondary | ICD-10-CM | POA: Diagnosis not present

## 2020-05-10 DIAGNOSIS — K7460 Unspecified cirrhosis of liver: Secondary | ICD-10-CM | POA: Diagnosis not present

## 2020-05-10 LAB — BASIC METABOLIC PANEL
Anion gap: 6 (ref 5–15)
BUN: 60 mg/dL — ABNORMAL HIGH (ref 8–23)
CO2: 20 mmol/L — ABNORMAL LOW (ref 22–32)
Calcium: 7.9 mg/dL — ABNORMAL LOW (ref 8.9–10.3)
Chloride: 113 mmol/L — ABNORMAL HIGH (ref 98–111)
Creatinine, Ser: 3.78 mg/dL — ABNORMAL HIGH (ref 0.61–1.24)
GFR, Estimated: 15 mL/min — ABNORMAL LOW (ref >60)
Glucose, Bld: 90 mg/dL (ref 70–99)
Potassium: 4.4 mmol/L (ref 3.5–5.1)
Sodium: 139 mmol/L (ref 135–145)

## 2020-05-10 MED ORDER — CIPROFLOXACIN HCL 250 MG PO TABS: 250.0000 mg | ORAL_TABLET | Freq: Every day | ORAL | 0 refills | Status: AC

## 2020-05-10 MED ORDER — CIPROFLOXACIN HCL 250 MG PO TABS: 250.0000 mg | ORAL_TABLET | Freq: Every day | ORAL | Status: DC

## 2020-05-10 NOTE — TOC Transition Note (Signed)
Transition of Care [facility]) - CM/SW Discharge Note   Patient Details  Name: Gabriel Spencer MRN: [id number] Date of Birth: Jul 12, 1931  [facility] CM/SW Contact:  Boneta Lucks, RN Phone Number: 05/10/2020, 12:38 PM   Clinical Narrative:   Patient medically ready to discharge home today. [facility] accepted the patient for home with hospice, equipment has been delivered. [facility] signed off. EMS scheduled and medical necessity printed.    Final next level of care: Home w Hospice Care Barriers to Discharge: Barriers Resolved  Patient Goals and CMS Choice Patient states their goals for this hospitalization and ongoing recovery are:: to go home. CMS Medicare.gov Compare Post Acute Care list provided to:: Patient Represenative (must comment) Choice offered to / list presented to : Adult Children,Spouse  Discharge Placement              Patient to be transferred to facility by: EMS   Patient and family notified of of transfer: 05/10/20  Discharge Plan and Services     Post Acute Care Choice: Durable Medical Equipment,Hospice               [facility] Agency: [place] Date [place]: 05/09/20 Time [place]: 21 Representative spoke with at [place]: Nora  Readmission Risk Interventions Readmission Risk Prevention Plan 05/10/2020 04/13/2020  Transportation Screening - Complete  PCP or Specialist Appt within 5-7 Days - Complete  Home Care Screening - Complete  Medication Review (RN CM) - Complete  Medication Review (RN Care Manager) Complete -  [place] or Home Care Consult Complete -  SW Recovery Care/Counseling Consult Complete -  Palliative Care Screening Complete -  [place] Not Applicable -  Some recent data might be hidden

## 2020-05-10 NOTE — Discharge Summary (Signed)
Physician Discharge Summary  Gabriel Spencer TIW:[id number] DOB: June 26, 1931 DOA: 05/07/2020  PCP: Doree Albee, MD  Admit date: 05/07/2020 Discharge date: 05/10/2020  Admitted From: Home Disposition:  Home with [place]: home with hospice Equipment/Devices: Hospital bed  Discharge Condition: Stable CODE STATUS: FULL COMFORT Diet recommendation: Comfort feeding   Brief/Interim Summary: 87 year old male with a history of liver cirrhosis, diastolic CHF, hypothyroidism, gouty arthritis presenting with shortness of breath and generalized weakness. The patient is a poor historian. This history is obtained from review of the medical record and speaking with the patient's daughter who is an Therapist, sports. Notably, the patient has had 2 recent hospital admissions in January. He was first hospitalized from 03/29/2020 to 04/01/2020 for acute on chronic diastolic CHF. He was [facility] to the [facility] for STRwith furosemide 20 mg daily. Unfortunately, he was hospitalized again from 04/10/2020 to 04/13/2020 when he was transferred over to the hospital from the [facility]. He was treated for aspiration pneumonia at that time. The patient ultimately returned home on 04/29/2020. Unfortunately, he has continued to have worsening generalized weakness. The patient has had poor oral intake and basically has not been able to get out of bed for about a week. In addition, the patient had been complaining of some shortness of breath since 05/06/2020. There is been no reports of fevers, chills, headache, chest pain, nausea, vomiting, diarrhea. He has been taking his lactulose. Apparently he has been having 1-2 loose bowel movements daily. Daughter states that his mentation has been as good as usual. She states that he has been receiving his medications, but has not been on any diuretics since discharge home on 04/29/2020. Because of his worsening generalized weakness and failure to thrive, the patient was  brought to the hospital for further evaluation. In the emergency department, the patient was afebrile hemodynamically stable with soft blood pressures in the 100s. Oxygen saturation was 94-96% on 3 L. BMP showed a serum creatinine of 2.51, sodium 130, potassium 5.0, and albumin 1.8. WBC was 7.6 with hemoglobin 11.7 and platelets 112,000. The patient was started on IV fluids. The patient's renal function continued to worsen.  Palliative medicine was consulted.  GOC was discussed with family and they wanted to take patient home with home hospice with the intent of focusing solely on comfort care.  Discharge Diagnoses:  Acute kidney injury -Secondary to volume depletion, hemodynamic changes but concerned about hepatorenal syndrome -Renal ultrasound--no hydronephrosis -renal function worsening -give albumin 25 grams x 3 -renal function continues to worsen -patient is not a candidate for HD -GOC was discussed and focus of care changed to focus soley on comfort  Failure to thrive -Serum S50--5397 -Folic QBHA--19.3 -XTK--2.409 -UA 11-20 WBC -Obtain urine culture--pending -Patient has continued to have a functional decline since November 2021  UTI -d/c home with cipro x 4 days -UA-->50WBC  Anasarca/liver cirrhosis -Presumptively, the patient has liver cirrhosis secondary toNASH -Viral hepatitis panel in January 2020 was negative -Previous abdominal imaging suggested hepatic steatosis -His hypoalbuminemia is also contributing to anasarca -Holding diuretics temporarily in the setting of AKI -ammonia 31   Hyperkalemia -Lokelma x 1-->improved  Chronic diastolic CHF -Although the patient has significant peripheral edema and anasarca, this is likely due to his liver cirrhosis and third spacing -He appears intravascularly volume depleted -03/30/2020 echo EF 70-25%, no WMA, mild elevated PASP  Thrombocytopenia -Secondary to liver cirrhosis -Monitor for signs of bleeding -Overall  hemoglobin stable  Hypothyroidism -Continue Synthroid  Stage  II sacral pressure injury -Local wound care -Present on admission  Goals of care -Discussed with the patient's daughter and patient -change to DNR -was suppose to have outpatient palliative consult on 2/14 -consult palliative--appreciated -overall prognosis poor -after [facility] discussions, family wanted to take patient home with hospice care to focus solely on his comfort   Discharge Instructions   Allergies as of 05/10/2020      Reactions   Cephalexin Hives      Medication List    STOP taking these medications   cholecalciferol 10 MCG (400 UNIT) Tabs tablet Commonly known as: VITAMIN D3   feeding supplement (PRO-STAT SUGAR FREE 64) Liqd   lactulose 10 GM/15ML solution Commonly known as: CHRONULAC   PRESERVISION AREDS PO     TAKE these medications   albuterol 108 (90 Base) MCG/ACT inhaler Commonly known as: VENTOLIN HFA Inhale 2 puffs into the lungs every 4 (four) hours as needed for wheezing or shortness of breath.   allopurinol 100 MG tablet Commonly known as: ZYLOPRIM Take 1 tablet (100 mg total) by mouth daily.   brimonidine 0.15 % ophthalmic solution Commonly known as: ALPHAGAN Place 1 drop into both eyes 2 (two) times daily.   ciprofloxacin 250 MG tablet Commonly known as: CIPRO Take 1 tablet (250 mg total) by mouth daily with breakfast. Start taking on: May 11, 2020   feeding supplement Liqd Take 237 mLs by mouth 2 (two) times daily between meals.   ketotifen 0.025 % ophthalmic solution Commonly known as: ZADITOR Place 1 drop into both eyes 2 (two) times daily.   latanoprost 0.005 % ophthalmic solution Commonly known as: XALATAN Place 1 drop into both eyes at bedtime.   levothyroxine 50 MCG tablet Commonly known as: SYNTHROID Take 1 tablet (50 mcg total) by mouth daily.   NON FORMULARY Diet Change: Dys 3 (mechanical soft), thin liquids (NO STRAW); NAS   Venelex  Oint Apply topically in the morning, at noon, and at bedtime. Special Instructions: Apply to sacrum, coccyx and bilateral buttocks qshift for prevention.       Follow-up Information    [facility] Follow up.   Specialty: Hospice Why:  Home with Hospice Contact information: [address] [address] [phone]             Allergies  Allergen Reactions  . Cephalexin Hives    Consultations:  palliative   Procedures/Studies: CT HEAD WO CONTRAST  Result Date: 04/11/2020 CLINICAL DATA:  Altered mental status. EXAM: CT HEAD WITHOUT CONTRAST TECHNIQUE: Contiguous axial images were obtained from the base of the skull through the vertex without intravenous contrast. COMPARISON:  None. FINDINGS: Brain: Mild diffuse cortical atrophy is noted. Mild chronic ischemic white matter disease is noted. No mass effect or midline shift is noted. Ventricular size is within normal limits. There is no evidence of mass lesion, hemorrhage or acute infarction. Vascular: No hyperdense vessel or unexpected calcification. Skull: Normal. Negative for fracture or focal lesion. Sinuses/Orbits: No acute finding. Other: None. IMPRESSION: Mild diffuse cortical atrophy. Mild chronic ischemic white matter disease. No acute intracranial abnormality seen. Electronically Signed   By: Marijo Conception M.D.   On: 04/11/2020 11:17   US RENAL  Result Date: 05/08/2020 CLINICAL DATA:  87 year old male with acute renal injury. Cirrhosis. EXAM: RENAL / URINARY TRACT ULTRASOUND COMPLETE COMPARISON:  Abdomen ultrasound 03/29/2020. CT Abdomen and Pelvis 03/29/2014. FINDINGS: Right Kidney: Renal measurements: 8.9 x 4.2 x 3.9 cm = volume: 77 mL. Preserved  cortical echogenicity. No hydronephrosis or right renal mass. Left Kidney: Renal measurements: 9.9 x 4.6 x 3.4 cm = volume: 81 mL. Cortical echogenicity at the upper limits of normal (image 16). No left hydronephrosis or renal mass.  Bladder: Decompressed, Foley catheter balloon visible. Other: Small volume ascites greater on the right. Nodular, cirrhotic liver (image 6). IMPRESSION: 1. Negative ultrasound appearance of both kidneys aside from renal volume loss. 2. Bladder decompressed by Foley catheter. 3. Small volume ascites.  Cirrhotic liver. Electronically Signed   By: Genevie Ann M.D.   On: 05/08/2020 11:40   DG Chest Port 1 View  Result Date: 05/07/2020 CLINICAL DATA:  Shortness of breath EXAM: PORTABLE CHEST 1 VIEW COMPARISON:  April 11, 2020 FINDINGS: There is a small right pleural effusion with airspace opacity in the lateral right base. There is atelectatic change in the left base. Heart size and pulmonary vascularity are within normal limits. No adenopathy. No bone lesions. IMPRESSION: Right pleural effusion. Airspace opacity lateral right base concerning for pneumonia. Atelectasis left base. Stable cardiac silhouette. Electronically Signed   By: Lowella Grip III M.D.   On: 05/07/2020 13:01   DG Chest Port 1 View  Result Date: 04/11/2020 CLINICAL DATA:  87 year old male with shortness of breath. Cirrhosis. Negative for COVID-19 04/10/2020. EXAM: PORTABLE CHEST 1 VIEW COMPARISON:  Portable chest 04/10/2020 and earlier. FINDINGS: Portable AP upright view at 0547 hours. Small bilateral pleural effusions with mildly decreased veiling opacity at both lung bases from yesterday. Mediastinal contours remain normal. Pulmonary vascularity also appears mildly decreased, no overt edema. No air bronchograms or pneumothorax identified. Stable visible bowel gas pattern, cholecystectomy clips. No acute osseous abnormality identified. IMPRESSION: 1. Small bilateral pleural effusions perhaps mildly decreased since yesterday. Lung base atelectasis. 2. No overt edema.  No new cardiopulmonary abnormality. Electronically Signed   By: Genevie Ann M.D.   On: 04/11/2020 06:32   DG Chest Port 1 View  Result Date: 04/10/2020 CLINICAL DATA:  Cough,  aspiration.  Pending COVID-19 test. EXAM: PORTABLE CHEST 1 VIEW COMPARISON:  03/29/2020 FINDINGS: Patient is rotated to the right. Lungs are adequately inflated demonstrate persistent bibasilar opacification with worsening in the right base likely small effusions with associated bibasilar atelectasis. Infection over the lung bases is possible. Mild stable cardiomegaly. Remainder of the exam is unchanged. IMPRESSION: Persistent bibasilar opacification with interval worsening in the right base likely small effusions with associated bibasilar atelectasis. Infection over the lung bases is possible. Electronically Signed   By: Marin Olp M.D.   On: 04/10/2020 16:22        Discharge Exam: Vitals:   05/09/20 2038 05/10/20 0445  BP: (!) 128/53 (!) 105/45  Pulse: 88 82  Resp: 18 16  Temp: 98.5 F (36.9 C)   SpO2: 94% 94%   Vitals:   05/09/20 0502 05/09/20 1304 05/09/20 2038 05/10/20 0445  BP: (!) 110/44 (!) 110/43 (!) 128/53 (!) 105/45  Pulse: 83 79 88 82  Resp: 20 19 18 16   Temp: (!) 97.5 F (36.4 C) 98.1 F (36.7 C) 98.5 F (36.9 C)   TempSrc: Oral Oral Oral   SpO2: 94% 97% 94% 94%  Weight:      Height:        General: Pt is alert, awake, not in acute distress Cardiovascular: RRR, S1/S2 +, no rubs, no gallops Respiratory: bibasilar rales. No wheeze Abdominal: Soft, NT, ND, bowel sounds + Extremities: 2+LE edema, no cyanosis   The results of significant diagnostics from this hospitalization (including  imaging, microbiology, ancillary and laboratory) are listed below for reference.    Significant Diagnostic Studies: CT HEAD WO CONTRAST  Result Date: 04/11/2020 CLINICAL DATA:  Altered mental status. EXAM: CT HEAD WITHOUT CONTRAST TECHNIQUE: Contiguous axial images were obtained from the base of the skull through the vertex without intravenous contrast. COMPARISON:  None. FINDINGS: Brain: Mild diffuse cortical atrophy is noted. Mild chronic ischemic white matter disease is noted.  No mass effect or midline shift is noted. Ventricular size is within normal limits. There is no evidence of mass lesion, hemorrhage or acute infarction. Vascular: No hyperdense vessel or unexpected calcification. Skull: Normal. Negative for fracture or focal lesion. Sinuses/Orbits: No acute finding. Other: None. IMPRESSION: Mild diffuse cortical atrophy. Mild chronic ischemic white matter disease. No acute intracranial abnormality seen. Electronically Signed   By: Marijo Conception M.D.   On: 04/11/2020 11:17   US RENAL  Result Date: 05/08/2020 CLINICAL DATA:  87 year old male with acute renal injury. Cirrhosis. EXAM: RENAL / URINARY TRACT ULTRASOUND COMPLETE COMPARISON:  Abdomen ultrasound 03/29/2020. CT Abdomen and Pelvis 03/29/2014. FINDINGS: Right Kidney: Renal measurements: 8.9 x 4.2 x 3.9 cm = volume: 77 mL. Preserved cortical echogenicity. No hydronephrosis or right renal mass. Left Kidney: Renal measurements: 9.9 x 4.6 x 3.4 cm = volume: 81 mL. Cortical echogenicity at the upper limits of normal (image 16). No left hydronephrosis or renal mass. Bladder: Decompressed, Foley catheter balloon visible. Other: Small volume ascites greater on the right. Nodular, cirrhotic liver (image 6). IMPRESSION: 1. Negative ultrasound appearance of both kidneys aside from renal volume loss. 2. Bladder decompressed by Foley catheter. 3. Small volume ascites.  Cirrhotic liver. Electronically Signed   By: Genevie Ann M.D.   On: 05/08/2020 11:40   DG Chest Port 1 View  Result Date: 05/07/2020 CLINICAL DATA:  Shortness of breath EXAM: PORTABLE CHEST 1 VIEW COMPARISON:  April 11, 2020 FINDINGS: There is a small right pleural effusion with airspace opacity in the lateral right base. There is atelectatic change in the left base. Heart size and pulmonary vascularity are within normal limits. No adenopathy. No bone lesions. IMPRESSION: Right pleural effusion. Airspace opacity lateral right base concerning for pneumonia. Atelectasis  left base. Stable cardiac silhouette. Electronically Signed   By: Lowella Grip III M.D.   On: 05/07/2020 13:01   DG Chest Port 1 View  Result Date: 04/11/2020 CLINICAL DATA:  87 year old male with shortness of breath. Cirrhosis. Negative for COVID-19 04/10/2020. EXAM: PORTABLE CHEST 1 VIEW COMPARISON:  Portable chest 04/10/2020 and earlier. FINDINGS: Portable AP upright view at 0547 hours. Small bilateral pleural effusions with mildly decreased veiling opacity at both lung bases from yesterday. Mediastinal contours remain normal. Pulmonary vascularity also appears mildly decreased, no overt edema. No air bronchograms or pneumothorax identified. Stable visible bowel gas pattern, cholecystectomy clips. No acute osseous abnormality identified. IMPRESSION: 1. Small bilateral pleural effusions perhaps mildly decreased since yesterday. Lung base atelectasis. 2. No overt edema.  No new cardiopulmonary abnormality. Electronically Signed   By: Genevie Ann M.D.   On: 04/11/2020 06:32   DG Chest Port 1 View  Result Date: 04/10/2020 CLINICAL DATA:  Cough, aspiration.  Pending COVID-19 test. EXAM: PORTABLE CHEST 1 VIEW COMPARISON:  03/29/2020 FINDINGS: Patient is rotated to the right. Lungs are adequately inflated demonstrate persistent bibasilar opacification with worsening in the right base likely small effusions with associated bibasilar atelectasis. Infection over the lung bases is possible. Mild stable cardiomegaly. Remainder of the exam is unchanged. IMPRESSION: Persistent bibasilar  opacification with interval worsening in the right base likely small effusions with associated bibasilar atelectasis. Infection over the lung bases is possible. Electronically Signed   By: Marin Olp M.D.   On: 04/10/2020 16:22     Microbiology: Recent Results (from the past 240 hour(s))  SARS CORONAVIRUS 2 (Carlyle Mcelrath 6-24 HRS) Nasopharyngeal Nasopharyngeal Swab     Status: None   Collection Time: 05/07/20 12:07 PM   Specimen:  Nasopharyngeal Swab  Result Value Ref Range Status   SARS Coronavirus 2 NEGATIVE NEGATIVE Final    Comment: (NOTE) SARS-CoV-2 target nucleic acids are NOT DETECTED.  The SARS-CoV-2 RNA is generally detectable in upper and lower respiratory specimens during the acute phase of infection. Negative results do not preclude SARS-CoV-2 infection, do not rule out co-infections with other pathogens, and should not be used as the sole basis for treatment or other patient management decisions. Negative results must be combined with clinical observations, patient history, and epidemiological information. The expected result is Negative.  Fact Sheet for Patients: SugarRoll.be  Fact Sheet for Healthcare Providers: https://www.woods-mathews.com/  This test is not yet approved or cleared by the [place] FDA and  has been authorized for detection and/or diagnosis of SARS-CoV-2 by FDA under an Emergency Use Authorization (EUA). This EUA will remain  in effect (meaning this test can be used) for the duration of the COVID-19 declaration under Se ction 564(b)(1) of the Act, 21 U.S.C. section 360bbb-3(b)(1), unless the authorization is terminated or revoked sooner.  Performed at [facility], [place] [address] [place], [place] [address]   Culture, Urine     Status: Abnormal (Preliminary result)   Collection Time: 05/08/20 11:39 AM   Specimen: Urine, Clean Catch  Result Value Ref Range Status   Specimen Description   Final    URINE, CLEAN CATCH Performed at [facility], [address], [place], [place] [address]    Special Requests   Final    NONE Performed at [facility], [address] [place], [place] [address]    Culture (A)  Final    >=100,000 COLONIES/mL ESCHERICHIA COLI 60,000 COLONIES/mL ENTEROCOCCUS FAECALIS SUSCEPTIBILITIES TO FOLLOW Performed at [facility], [place] [address], [place],  [address]    Report Status  PENDING  Incomplete     Labs: Basic Metabolic Panel: Recent Labs  Lab 05/07/20 1218 05/08/20 0536 05/09/20 0619 05/10/20 0509  NA 138 138 140 139  K 5.0 4.7 5.8* 4.4  CL 109 110 115* 113*  CO2 21* 21* 16* 20*  GLUCOSE 80 97 62* 90  BUN 54* 55* 58* 60*  CREATININE 2.51* 2.80* 3.24* 3.78*  CALCIUM 8.3* 7.9* 7.7* 7.9*   Liver Function Tests: Recent Labs  Lab 05/07/20 1218 05/08/20 0536 05/09/20 0619  AST 52* 47* 56*  ALT 24 23 22   ALKPHOS 136* 124 117  BILITOT 2.5* 2.1* 2.3*  PROT 5.9* 5.4* 5.3*  ALBUMIN 1.8* 1.7* 1.9*   No results for input(s): LIPASE, AMYLASE in the last 168 hours. Recent Labs  Lab 05/07/20 1507  AMMONIA 31   CBC: Recent Labs  Lab 05/07/20 1218 05/09/20 0819  WBC 7.6 10.9*  NEUTROABS 5.4  --   HGB 11.7* 10.3*  HCT 35.7* 30.6*  MCV 101.1* 99.7  PLT 112* 87*   Cardiac Enzymes: No results for input(s): CKTOTAL, CKMB, CKMBINDEX, TROPONINI in the last 168 hours. BNP: Invalid input(s): POCBNP CBG: Recent Labs  Lab 05/07/20 1254 05/07/20 1406 05/07/20 1759 05/07/20 1933 05/09/20 1352  GLUCAP 81 85 81 90 99    Time coordinating discharge:  36 minutes  Signed:  Orson Eva, DO [facility] Pager: [phone] 05/10/2020, 11:12 AM

## 2020-05-10 NOTE — Progress Notes (Signed)
SLP Cancellation Note  Patient Details Name: Gabriel Spencer MRN: [id number] DOB: 03-Sep-1931   Cancelled treatment:       Reason Eval/Treat Not Completed: Other (comment) (Pt will be going home with hospice. SLP will sign off.)   Thank you,  Genene Churn, [place]    [place] 05/10/2020, 1:07 PM

## 2020-05-11 ENCOUNTER — Telehealth: Payer: Self-pay | Admitting: Nurse Practitioner

## 2020-05-11 LAB — URINE CULTURE: Culture: >=100000 — AB

## 2020-05-11 NOTE — Telephone Encounter (Signed)
Called and left a message with Cassandra to verify if patient had been admitted to Hospice services at [facility], left my name and call back number.  Rec'd call back from [place] and patient was admitted to Hospice services there on 05/10/20, we will discharge patient from [facility].

## 2020-05-12 DIAGNOSIS — I5032 Chronic diastolic (congestive) heart failure: Secondary | ICD-10-CM

## 2020-05-12 DIAGNOSIS — L89312 Pressure ulcer of right buttock, stage 2: Secondary | ICD-10-CM

## 2020-05-12 DIAGNOSIS — E43 Unspecified severe protein-calorie malnutrition: Secondary | ICD-10-CM

## 2020-05-12 DIAGNOSIS — J690 Pneumonitis due to inhalation of food and vomit: Secondary | ICD-10-CM

## 2020-05-12 DIAGNOSIS — D696 Thrombocytopenia, unspecified: Secondary | ICD-10-CM

## 2020-05-12 DIAGNOSIS — J9601 Acute respiratory failure with hypoxia: Secondary | ICD-10-CM

## 2020-05-12 DIAGNOSIS — L89152 Pressure ulcer of sacral region, stage 2: Secondary | ICD-10-CM

## 2020-05-12 DIAGNOSIS — D649 Anemia, unspecified: Secondary | ICD-10-CM

## 2020-05-12 DIAGNOSIS — K7460 Unspecified cirrhosis of liver: Secondary | ICD-10-CM

## 2020-05-12 DIAGNOSIS — I110 Hypertensive heart disease with heart failure: Secondary | ICD-10-CM

## 2020-05-24 DEATH — deceased

## 2020-05-26 ENCOUNTER — Ambulatory Visit: Payer: Medicare PPO | Admitting: Nurse Practitioner

## 2020-10-27 ENCOUNTER — Encounter (INDEPENDENT_AMBULATORY_CARE_PROVIDER_SITE_OTHER): Payer: Self-pay
